# Patient Record
Sex: Female | Born: 1935
Health system: Southern US, Community
[De-identification: ages and names within clinical notes are randomized; demographics above are authoritative.]

## PROBLEM LIST (undated history)

## (undated) DIAGNOSIS — M109 Gout, unspecified: Secondary | ICD-10-CM

## (undated) DIAGNOSIS — K219 Gastro-esophageal reflux disease without esophagitis: Secondary | ICD-10-CM

## (undated) DIAGNOSIS — N184 Chronic kidney disease, stage 4 (severe): Secondary | ICD-10-CM

## (undated) DIAGNOSIS — E669 Obesity, unspecified: Secondary | ICD-10-CM

## (undated) DIAGNOSIS — B019 Varicella without complication: Secondary | ICD-10-CM

## (undated) DIAGNOSIS — Z780 Asymptomatic menopausal state: Secondary | ICD-10-CM

## (undated) DIAGNOSIS — M1990 Unspecified osteoarthritis, unspecified site: Secondary | ICD-10-CM

## (undated) DIAGNOSIS — C50919 Malignant neoplasm of unspecified site of unspecified female breast: Secondary | ICD-10-CM

## (undated) DIAGNOSIS — I872 Venous insufficiency (chronic) (peripheral): Secondary | ICD-10-CM

## (undated) DIAGNOSIS — K759 Inflammatory liver disease, unspecified: Secondary | ICD-10-CM

## (undated) DIAGNOSIS — I639 Cerebral infarction, unspecified: Secondary | ICD-10-CM

## (undated) DIAGNOSIS — I10 Essential (primary) hypertension: Secondary | ICD-10-CM

## (undated) HISTORY — PX: TONSILLECTOMY AND ADENOIDECTOMY: SHX28

## (undated) HISTORY — DX: Inflammatory liver disease, unspecified: K75.9

## (undated) HISTORY — PX: APPENDECTOMY: SHX54

## (undated) HISTORY — DX: Essential (primary) hypertension: I10

## (undated) HISTORY — PX: TOTAL KNEE ARTHROPLASTY: SHX125

## (undated) HISTORY — DX: Asymptomatic menopausal state: Z78.0

## (undated) HISTORY — PX: BREAST SURGERY: SHX581

## (undated) HISTORY — DX: Varicella without complication: B01.9

## (undated) HISTORY — DX: Unspecified osteoarthritis, unspecified site: M19.90

## (undated) HISTORY — PX: CHOLECYSTECTOMY: SHX55

## (undated) HISTORY — DX: Gastro-esophageal reflux disease without esophagitis: K21.9

---

## 1997-08-07 ENCOUNTER — Other Ambulatory Visit: Admission: RE | Admit: 1997-08-07 | Discharge: 1997-08-07 | Payer: Self-pay | Admitting: General Surgery

## 1997-08-17 ENCOUNTER — Inpatient Hospital Stay (HOSPITAL_COMMUNITY): Admission: RE | Admit: 1997-08-17 | Discharge: 1997-08-18 | Payer: Self-pay | Admitting: General Surgery

## 1997-09-18 ENCOUNTER — Ambulatory Visit (HOSPITAL_COMMUNITY): Admission: RE | Admit: 1997-09-18 | Discharge: 1997-09-18 | Payer: Self-pay | Admitting: Oncology

## 1997-12-01 ENCOUNTER — Encounter: Admission: RE | Admit: 1997-12-01 | Discharge: 1998-02-01 | Payer: Self-pay | Admitting: Oncology

## 1998-05-22 ENCOUNTER — Other Ambulatory Visit: Admission: RE | Admit: 1998-05-22 | Discharge: 1998-05-22 | Payer: Self-pay | Admitting: Family Medicine

## 1999-06-04 ENCOUNTER — Other Ambulatory Visit: Admission: RE | Admit: 1999-06-04 | Discharge: 1999-06-04 | Payer: Self-pay | Admitting: Family Medicine

## 1999-12-26 ENCOUNTER — Emergency Department (HOSPITAL_COMMUNITY): Admission: EM | Admit: 1999-12-26 | Discharge: 1999-12-26 | Payer: Self-pay | Admitting: Emergency Medicine

## 2000-01-14 ENCOUNTER — Ambulatory Visit (HOSPITAL_COMMUNITY): Admission: RE | Admit: 2000-01-14 | Discharge: 2000-01-14 | Payer: Self-pay | Admitting: Gastroenterology

## 2000-01-15 ENCOUNTER — Encounter: Payer: Self-pay | Admitting: Gastroenterology

## 2000-01-15 ENCOUNTER — Ambulatory Visit (HOSPITAL_COMMUNITY): Admission: RE | Admit: 2000-01-15 | Discharge: 2000-01-15 | Payer: Self-pay | Admitting: Gastroenterology

## 2000-03-10 ENCOUNTER — Ambulatory Visit (HOSPITAL_COMMUNITY): Admission: RE | Admit: 2000-03-10 | Discharge: 2000-03-10 | Payer: Self-pay | Admitting: Gastroenterology

## 2000-06-12 ENCOUNTER — Other Ambulatory Visit: Admission: RE | Admit: 2000-06-12 | Discharge: 2000-06-12 | Payer: Self-pay | Admitting: *Deleted

## 2003-08-22 ENCOUNTER — Other Ambulatory Visit: Admission: RE | Admit: 2003-08-22 | Discharge: 2003-08-22 | Payer: Self-pay | Admitting: Family Medicine

## 2003-08-23 LAB — CONVERTED CEMR LAB: Pap Smear: NORMAL

## 2003-11-22 ENCOUNTER — Encounter: Admission: RE | Admit: 2003-11-22 | Discharge: 2003-11-22 | Payer: Self-pay | Admitting: Family Medicine

## 2004-08-28 ENCOUNTER — Ambulatory Visit: Payer: Self-pay | Admitting: Family Medicine

## 2004-09-13 ENCOUNTER — Ambulatory Visit: Payer: Self-pay | Admitting: Oncology

## 2004-11-08 ENCOUNTER — Ambulatory Visit: Payer: Self-pay | Admitting: Oncology

## 2005-02-19 ENCOUNTER — Encounter: Admission: RE | Admit: 2005-02-19 | Discharge: 2005-02-19 | Payer: Self-pay | Admitting: Family Medicine

## 2005-02-19 ENCOUNTER — Ambulatory Visit: Payer: Self-pay | Admitting: Family Medicine

## 2005-05-01 ENCOUNTER — Ambulatory Visit: Payer: Self-pay | Admitting: Family Medicine

## 2005-05-12 ENCOUNTER — Ambulatory Visit: Payer: Self-pay | Admitting: Oncology

## 2005-05-28 ENCOUNTER — Encounter: Payer: Self-pay | Admitting: Oncology

## 2005-06-11 ENCOUNTER — Ambulatory Visit: Payer: Self-pay | Admitting: Family Medicine

## 2005-09-02 ENCOUNTER — Encounter: Payer: Self-pay | Admitting: Family Medicine

## 2005-09-02 ENCOUNTER — Ambulatory Visit: Payer: Self-pay | Admitting: Family Medicine

## 2005-09-02 ENCOUNTER — Other Ambulatory Visit: Admission: RE | Admit: 2005-09-02 | Discharge: 2005-09-02 | Payer: Self-pay | Admitting: Family Medicine

## 2005-09-02 LAB — CONVERTED CEMR LAB: Pap Smear: NORMAL

## 2005-11-06 ENCOUNTER — Ambulatory Visit: Payer: Self-pay | Admitting: Oncology

## 2006-05-14 ENCOUNTER — Ambulatory Visit: Payer: Self-pay | Admitting: Family Medicine

## 2006-08-06 ENCOUNTER — Ambulatory Visit: Payer: Self-pay | Admitting: Oncology

## 2006-09-07 DIAGNOSIS — Z853 Personal history of malignant neoplasm of breast: Secondary | ICD-10-CM

## 2006-09-07 DIAGNOSIS — I10 Essential (primary) hypertension: Secondary | ICD-10-CM | POA: Insufficient documentation

## 2006-09-07 DIAGNOSIS — K219 Gastro-esophageal reflux disease without esophagitis: Secondary | ICD-10-CM

## 2006-09-09 ENCOUNTER — Ambulatory Visit: Payer: Self-pay | Admitting: Family Medicine

## 2006-09-09 DIAGNOSIS — M810 Age-related osteoporosis without current pathological fracture: Secondary | ICD-10-CM | POA: Insufficient documentation

## 2006-09-09 LAB — CONVERTED CEMR LAB
ALT: 19 units/L (ref 0–35)
AST: 24 units/L (ref 0–37)
Albumin: 4 g/dL (ref 3.5–5.2)
Alkaline Phosphatase: 95 units/L (ref 39–117)
Basophils Relative: 2.3 % — ABNORMAL HIGH (ref 0.0–1.0)
Bilirubin Urine: NEGATIVE
Calcium: 11 mg/dL — ABNORMAL HIGH (ref 8.4–10.5)
Cholesterol: 189 mg/dL (ref 0–200)
Eosinophils Absolute: 0.2 10*3/uL (ref 0.0–0.6)
Eosinophils Relative: 2.9 % (ref 0.0–5.0)
GFR calc Af Amer: 51 mL/min
GFR calc non Af Amer: 42 mL/min
Glucose, Urine, Semiquant: NEGATIVE
HCT: 37.9 % (ref 36.0–46.0)
Ketones, urine, test strip: NEGATIVE
LDL Cholesterol: 118 mg/dL — ABNORMAL HIGH (ref 0–99)
Lymphocytes Relative: 21.2 % (ref 12.0–46.0)
Monocytes Absolute: 0.4 10*3/uL (ref 0.2–0.7)
Neutro Abs: 3.8 10*3/uL (ref 1.4–7.7)
Platelets: 360 10*3/uL (ref 150–400)
Potassium: 3.6 meq/L (ref 3.5–5.1)
Sodium: 148 meq/L — ABNORMAL HIGH (ref 135–145)
Total Bilirubin: 0.8 mg/dL (ref 0.3–1.2)
Total CHOL/HDL Ratio: 3.9
Total Protein: 7.3 g/dL (ref 6.0–8.3)
Urobilinogen, UA: NEGATIVE
WBC Urine, dipstick: NEGATIVE
WBC: 5.7 10*3/uL (ref 4.5–10.5)
pH: 5.5

## 2006-09-15 DIAGNOSIS — Z8739 Personal history of other diseases of the musculoskeletal system and connective tissue: Secondary | ICD-10-CM

## 2006-11-20 ENCOUNTER — Ambulatory Visit: Payer: Self-pay | Admitting: Family Medicine

## 2006-11-25 LAB — CONVERTED CEMR LAB: Vit D, 1,25-Dihydroxy: 34 (ref 30–89)

## 2006-11-26 ENCOUNTER — Telehealth: Payer: Self-pay | Admitting: Family Medicine

## 2007-01-25 ENCOUNTER — Ambulatory Visit: Payer: Self-pay | Admitting: Family Medicine

## 2007-02-03 ENCOUNTER — Telehealth: Payer: Self-pay | Admitting: Family Medicine

## 2007-02-26 ENCOUNTER — Encounter: Payer: Self-pay | Admitting: Family Medicine

## 2007-03-02 ENCOUNTER — Ambulatory Visit: Payer: Self-pay | Admitting: Oncology

## 2007-03-04 ENCOUNTER — Encounter: Payer: Self-pay | Admitting: Family Medicine

## 2007-09-16 ENCOUNTER — Ambulatory Visit: Payer: Self-pay | Admitting: Family Medicine

## 2007-09-16 DIAGNOSIS — E039 Hypothyroidism, unspecified: Secondary | ICD-10-CM | POA: Insufficient documentation

## 2007-09-16 DIAGNOSIS — E669 Obesity, unspecified: Secondary | ICD-10-CM

## 2007-09-16 LAB — CONVERTED CEMR LAB
Blood in Urine, dipstick: NEGATIVE
Nitrite: NEGATIVE
Protein, U semiquant: NEGATIVE
Urobilinogen, UA: 0.2

## 2007-09-24 LAB — CONVERTED CEMR LAB
ALT: 19 units/L (ref 0–35)
AST: 25 units/L (ref 0–37)
BUN: 52 mg/dL — ABNORMAL HIGH (ref 6–23)
Basophils Absolute: 0 10*3/uL (ref 0.0–0.1)
CO2: 26 meq/L (ref 19–32)
Eosinophils Absolute: 0.2 10*3/uL (ref 0.0–0.7)
HCT: 35.5 % — ABNORMAL LOW (ref 36.0–46.0)
Hemoglobin: 12.2 g/dL (ref 12.0–15.0)
LDL Cholesterol: 122 mg/dL — ABNORMAL HIGH (ref 0–99)
MCHC: 34.3 g/dL (ref 30.0–36.0)
Neutro Abs: 3.9 10*3/uL (ref 1.4–7.7)
Platelets: 349 10*3/uL (ref 150–400)
Potassium: 3 meq/L — ABNORMAL LOW (ref 3.5–5.1)
RBC: 3.98 M/uL (ref 3.87–5.11)
RDW: 12.8 % (ref 11.5–14.6)
Sodium: 141 meq/L (ref 135–145)
TSH: 3.18 microintl units/mL (ref 0.35–5.50)
Total Bilirubin: 0.7 mg/dL (ref 0.3–1.2)
Total Protein: 7.9 g/dL (ref 6.0–8.3)
WBC: 5.4 10*3/uL (ref 4.5–10.5)

## 2007-09-28 ENCOUNTER — Telehealth: Payer: Self-pay | Admitting: Family Medicine

## 2007-10-28 ENCOUNTER — Ambulatory Visit: Payer: Self-pay | Admitting: Family Medicine

## 2007-10-29 ENCOUNTER — Encounter: Payer: Self-pay | Admitting: Family Medicine

## 2007-11-15 LAB — CONVERTED CEMR LAB
BUN: 49 mg/dL — ABNORMAL HIGH (ref 6–23)
CO2: 26 meq/L (ref 19–32)
Creatinine, Ser: 2 mg/dL — ABNORMAL HIGH (ref 0.4–1.2)
GFR calc Af Amer: 31 mL/min
GFR calc non Af Amer: 26 mL/min
Sodium: 140 meq/L (ref 135–145)

## 2007-11-16 ENCOUNTER — Telehealth: Payer: Self-pay | Admitting: Family Medicine

## 2007-11-30 ENCOUNTER — Ambulatory Visit: Payer: Self-pay | Admitting: Family Medicine

## 2007-12-09 ENCOUNTER — Telehealth (INDEPENDENT_AMBULATORY_CARE_PROVIDER_SITE_OTHER): Payer: Self-pay | Admitting: *Deleted

## 2007-12-13 ENCOUNTER — Ambulatory Visit: Payer: Self-pay | Admitting: Family Medicine

## 2007-12-16 LAB — CONVERTED CEMR LAB
Chloride: 93 meq/L — ABNORMAL LOW (ref 96–112)
Creatinine, Ser: 1.8 mg/dL — ABNORMAL HIGH (ref 0.4–1.2)
GFR calc non Af Amer: 29 mL/min
Potassium: 4.4 meq/L (ref 3.5–5.1)
Sodium: 127 meq/L — ABNORMAL LOW (ref 135–145)

## 2008-02-10 ENCOUNTER — Ambulatory Visit: Payer: Self-pay | Admitting: Family Medicine

## 2008-02-10 LAB — CONVERTED CEMR LAB
Bilirubin Urine: NEGATIVE
Blood in Urine, dipstick: NEGATIVE
Glucose, Urine, Semiquant: NEGATIVE
Ketones, urine, test strip: NEGATIVE
Protein, U semiquant: NEGATIVE
Specific Gravity, Urine: <1.005
WBC Urine, dipstick: NEGATIVE
pH: 6

## 2008-02-15 ENCOUNTER — Ambulatory Visit: Payer: Self-pay | Admitting: Family Medicine

## 2008-02-15 DIAGNOSIS — N19 Unspecified kidney failure: Secondary | ICD-10-CM | POA: Insufficient documentation

## 2008-02-15 LAB — CONVERTED CEMR LAB
ALT: 17 units/L (ref 0–35)
AST: 24 units/L (ref 0–37)
Alkaline Phosphatase: 61 units/L (ref 39–117)
Basophils Absolute: 0 10*3/uL (ref 0.0–0.1)
Basophils Relative: 0.6 % (ref 0.0–3.0)
CO2: 30 meq/L (ref 19–32)
Calcium: 11.1 mg/dL — ABNORMAL HIGH (ref 8.4–10.5)
Chloride: 102 meq/L (ref 96–112)
Cholesterol: 182 mg/dL (ref 0–200)
Creatinine, Ser: 1.6 mg/dL — ABNORMAL HIGH (ref 0.4–1.2)
Eosinophils Absolute: 0.2 10*3/uL (ref 0.0–0.7)
Eosinophils Relative: 2.9 % (ref 0.0–5.0)
Glucose, Bld: 108 mg/dL — ABNORMAL HIGH (ref 70–99)
HCT: 35.3 % — ABNORMAL LOW (ref 36.0–46.0)
HDL: 55 mg/dL (ref >39.0)
Hemoglobin: 12.2 g/dL (ref 12.0–15.0)
LDL Cholesterol: 108 mg/dL — ABNORMAL HIGH (ref 0–99)
Monocytes Absolute: 0.4 10*3/uL (ref 0.1–1.0)
Monocytes Relative: 8.4 % (ref 3.0–12.0)
Neutro Abs: 3.6 10*3/uL (ref 1.4–7.7)
RBC: 3.85 M/uL — ABNORMAL LOW (ref 3.87–5.11)
RDW: 13.2 % (ref 11.5–14.6)
Sodium: 139 meq/L (ref 135–145)
TSH: 2.44 microintl units/mL (ref 0.35–5.50)
Total CHOL/HDL Ratio: 3.3
Total Protein: 7.2 g/dL (ref 6.0–8.3)
VLDL: 19 mg/dL (ref 0–40)
WBC: 5.3 10*3/uL (ref 4.5–10.5)

## 2008-03-01 ENCOUNTER — Encounter: Payer: Self-pay | Admitting: Family Medicine

## 2008-03-08 ENCOUNTER — Ambulatory Visit: Payer: Self-pay | Admitting: Oncology

## 2008-03-10 ENCOUNTER — Encounter: Payer: Self-pay | Admitting: Family Medicine

## 2008-05-18 ENCOUNTER — Telehealth: Payer: Self-pay | Admitting: Family Medicine

## 2008-05-29 ENCOUNTER — Ambulatory Visit: Payer: Self-pay | Admitting: Family Medicine

## 2008-06-01 LAB — CONVERTED CEMR LAB
Calcium: 10.1 mg/dL (ref 8.4–10.5)
Creatinine, Ser: 1.7 mg/dL — ABNORMAL HIGH (ref 0.4–1.2)
Phosphorus: 3.6 mg/dL (ref 2.3–4.6)
Potassium: 4 meq/L (ref 3.5–5.1)
Sodium: 135 meq/L (ref 135–145)

## 2008-06-06 ENCOUNTER — Ambulatory Visit: Payer: Self-pay | Admitting: Family Medicine

## 2008-07-20 ENCOUNTER — Ambulatory Visit: Payer: Self-pay | Admitting: Family Medicine

## 2008-07-20 DIAGNOSIS — I959 Hypotension, unspecified: Secondary | ICD-10-CM

## 2008-07-20 DIAGNOSIS — J309 Allergic rhinitis, unspecified: Secondary | ICD-10-CM | POA: Insufficient documentation

## 2008-09-21 ENCOUNTER — Ambulatory Visit: Payer: Self-pay | Admitting: Family Medicine

## 2008-09-21 LAB — CONVERTED CEMR LAB
Bilirubin Urine: NEGATIVE
Glucose, Urine, Semiquant: NEGATIVE
Ketones, urine, test strip: NEGATIVE
Protein, U semiquant: NEGATIVE
Specific Gravity, Urine: <1.005
Urobilinogen, UA: 0.2

## 2008-10-04 LAB — CONVERTED CEMR LAB
AST: 21 units/L (ref 0–37)
Alkaline Phosphatase: 59 units/L (ref 39–117)
Basophils Absolute: 0 10*3/uL (ref 0.0–0.1)
Basophils Relative: 1 % (ref 0.0–3.0)
Chloride: 113 meq/L — ABNORMAL HIGH (ref 96–112)
Eosinophils Absolute: 0.2 10*3/uL (ref 0.0–0.7)
Eosinophils Relative: 3.3 % (ref 0.0–5.0)
GFR calc non Af Amer: 35.58 mL/min (ref >60)
Glucose, Bld: 105 mg/dL — ABNORMAL HIGH (ref 70–99)
HCT: 36.2 % (ref 36.0–46.0)
Hemoglobin: 12.3 g/dL (ref 12.0–15.0)
MCHC: 34.1 g/dL (ref 30.0–36.0)
MCV: 91.7 fL (ref 78.0–100.0)
Monocytes Absolute: 0.4 10*3/uL (ref 0.1–1.0)
Neutro Abs: 3.1 10*3/uL (ref 1.4–7.7)
Sodium: 143 meq/L (ref 135–145)
TSH: 3.18 microintl units/mL (ref 0.35–5.50)
Total Bilirubin: 0.7 mg/dL (ref 0.3–1.2)
Total CHOL/HDL Ratio: 3
Total Protein: 7.3 g/dL (ref 6.0–8.3)
Vit D, 25-Hydroxy: 21 ng/mL — ABNORMAL LOW (ref 30–89)
WBC: 4.7 10*3/uL (ref 4.5–10.5)

## 2008-12-04 ENCOUNTER — Encounter: Payer: Self-pay | Admitting: Family Medicine

## 2009-01-25 ENCOUNTER — Telehealth: Payer: Self-pay | Admitting: Family Medicine

## 2009-01-25 DIAGNOSIS — E559 Vitamin D deficiency, unspecified: Secondary | ICD-10-CM | POA: Insufficient documentation

## 2009-02-01 ENCOUNTER — Ambulatory Visit: Payer: Self-pay | Admitting: Family Medicine

## 2009-02-20 ENCOUNTER — Telehealth: Payer: Self-pay | Admitting: Family Medicine

## 2009-03-06 ENCOUNTER — Ambulatory Visit: Payer: Self-pay | Admitting: Oncology

## 2009-03-06 ENCOUNTER — Encounter: Payer: Self-pay | Admitting: Family Medicine

## 2009-03-09 ENCOUNTER — Encounter: Payer: Self-pay | Admitting: Family Medicine

## 2009-03-13 ENCOUNTER — Ambulatory Visit: Payer: Self-pay | Admitting: Family Medicine

## 2009-03-13 DIAGNOSIS — M1710 Unilateral primary osteoarthritis, unspecified knee: Secondary | ICD-10-CM

## 2009-03-14 ENCOUNTER — Ambulatory Visit: Payer: Self-pay | Admitting: Family Medicine

## 2009-03-21 ENCOUNTER — Telehealth: Payer: Self-pay | Admitting: Family Medicine

## 2009-04-06 ENCOUNTER — Encounter: Payer: Self-pay | Admitting: Family Medicine

## 2009-05-08 ENCOUNTER — Inpatient Hospital Stay (HOSPITAL_COMMUNITY): Admission: RE | Admit: 2009-05-08 | Discharge: 2009-05-10 | Payer: Self-pay | Admitting: Orthopedic Surgery

## 2009-05-24 ENCOUNTER — Encounter: Payer: Self-pay | Admitting: Family Medicine

## 2009-08-14 ENCOUNTER — Telehealth: Payer: Self-pay | Admitting: Family Medicine

## 2010-02-14 ENCOUNTER — Encounter: Payer: Self-pay | Admitting: Family Medicine

## 2010-02-14 ENCOUNTER — Other Ambulatory Visit: Payer: Self-pay | Admitting: Family Medicine

## 2010-02-14 ENCOUNTER — Ambulatory Visit
Admission: RE | Admit: 2010-02-14 | Discharge: 2010-02-14 | Payer: Self-pay | Source: Home / Self Care | Attending: Family Medicine | Admitting: Family Medicine

## 2010-02-14 DIAGNOSIS — Z96659 Presence of unspecified artificial knee joint: Secondary | ICD-10-CM | POA: Insufficient documentation

## 2010-02-14 LAB — HEPATIC FUNCTION PANEL
ALT: 16 U/L (ref 0–35)
AST: 21 U/L (ref 0–37)
Albumin: 4.1 g/dL (ref 3.5–5.2)
Alkaline Phosphatase: 70 U/L (ref 39–117)
Bilirubin, Direct: 0.1 mg/dL (ref 0.0–0.3)
Total Bilirubin: 0.5 mg/dL (ref 0.3–1.2)
Total Protein: 7.1 g/dL (ref 6.0–8.3)

## 2010-02-14 LAB — CBC WITH DIFFERENTIAL/PLATELET
Basophils Absolute: 0 10*3/uL (ref 0.0–0.1)
Basophils Relative: 0.6 % (ref 0.0–3.0)
Eosinophils Absolute: 0.2 10*3/uL (ref 0.0–0.7)
Eosinophils Relative: 3.4 % (ref 0.0–5.0)
HCT: 35.9 % — ABNORMAL LOW (ref 36.0–46.0)
Hemoglobin: 12.1 g/dL (ref 12.0–15.0)
Lymphocytes Relative: 20.2 % (ref 12.0–46.0)
Lymphs Abs: 1.1 10*3/uL (ref 0.7–4.0)
MCHC: 33.8 g/dL (ref 30.0–36.0)
MCV: 89.5 fl (ref 78.0–100.0)
Monocytes Absolute: 0.4 10*3/uL (ref 0.1–1.0)
Monocytes Relative: 7.4 % (ref 3.0–12.0)
Neutro Abs: 3.7 10*3/uL (ref 1.4–7.7)
Neutrophils Relative %: 68.4 % (ref 43.0–77.0)
Platelets: 306 10*3/uL (ref 150.0–400.0)
RBC: 4.01 Mil/uL (ref 3.87–5.11)
RDW: 13.9 % (ref 11.5–14.6)
WBC: 5.4 10*3/uL (ref 4.5–10.5)

## 2010-02-14 LAB — LIPID PANEL
Cholesterol: 181 mg/dL (ref 0–200)
HDL: 61.6 mg/dL (ref >39.00)
LDL Cholesterol: 101 mg/dL — ABNORMAL HIGH (ref 0–99)
Total CHOL/HDL Ratio: 3
Triglycerides: 91 mg/dL (ref 0.0–149.0)
VLDL: 18.2 mg/dL (ref 0.0–40.0)

## 2010-02-14 LAB — CONVERTED CEMR LAB
Bilirubin Urine: NEGATIVE
Blood in Urine, dipstick: NEGATIVE
Specific Gravity, Urine: <1.005
WBC Urine, dipstick: NEGATIVE
pH: 5

## 2010-02-14 LAB — BASIC METABOLIC PANEL
BUN: 31 mg/dL — ABNORMAL HIGH (ref 6–23)
CO2: 25 mEq/L (ref 19–32)
Calcium: 10.4 mg/dL (ref 8.4–10.5)
Chloride: 107 mEq/L (ref 96–112)
Creatinine, Ser: 1.4 mg/dL — ABNORMAL HIGH (ref 0.4–1.2)
GFR: 37.77 mL/min — ABNORMAL LOW (ref >60.00)
Glucose, Bld: 92 mg/dL (ref 70–99)
Potassium: 4.8 mEq/L (ref 3.5–5.1)
Sodium: 138 mEq/L (ref 135–145)

## 2010-02-14 LAB — TSH: TSH: 3.3 u[IU]/mL (ref 0.35–5.50)

## 2010-02-28 NOTE — Assessment & Plan Note (Signed)
Summary: emp/pt fasting/cjr/pt rescd from bump//ccm/pt rsc/cjr   Vital Signs:  Patient profile:   77 year old female Menstrual status:  postmenopausal Height:      58 inches Weight:      160 pounds BMI:     33.56 O2 Sat:      96 % on Room air Temp:     97.6 degrees F oral Pulse rate:   87 / minute Pulse rhythm:   regular BP sitting:   110 / 70  (left arm) Cuff size:   regular  Vitals Entered By: Sherron Monday, CMA (AAMA) (February 14, 2010 1:26 PM)  O2 Flow:  Room air CC: Annual Visit for Disease Management   History of Present Illness: This 77 year old white widowed is in to discuss her medical problems as well as refill her medications as well as get numbness her laboratory studies He had right knee replaced April 12 and Dr. Catalina Antigua 01. She had been doing well with good movement and reflexes Continues to see Dr. Fabiola Backer for breast cancer and doing 5 had mammogram in February Blood pressure is doing fine Her is under good control Other joints are doing fine Blood pressure is stated is under good control  Preventive Screening-Counseling & Management  Alcohol-Tobacco     Smoking Status: never  Caffeine-Diet-Exercise     Does Patient Exercise: yes  Current Medications (verified): 1)  Toprol Xl 25 Mg  Xr24h-Tab (Metoprolol Succinate) .Marland Kitchen.. 1 By Mouth Once Daily 2)  Ecotrin 325 Mg  Tbec (Aspirin) .... Take 1 Tablet By Mouth Once A Day 3)  Caltrate 600+d 600-400 Mg-Unit  Tabs (Calcium Carbonate-Vitamin D) 4)  Multi-Vitamin   Tabs (Multiple Vitamin) 5)  Diovan 80 Mg Tabs (Valsartan) .Marland Kitchen.. 1 Qd 6)  Prilosec 20 Mg Cpdr (Omeprazole) .... Otc  Allergies (verified): 1)  ! Relafen 2)  ! Prednisone 3)  ! Levaquin  Past History:  Past Medical History: Last updated: 03/13/2009 Breast cancer, hx of, rt breast GERD Hypertension Arthritis Menopause Chicken Pox Hepatitis/Jaundice  Family History: Last updated: 09/07/2006 Family History Breast cancer Family History of  Cardiovascular disorder  Social History: Last updated: 09/07/2006 Retired Single Never Smoked Alcohol use-no Drug use-no Regular exercise-yes  Past Surgical History: Appendectomy-1950 Cholecystectomy-1999 T&A-1947 Breast CA-1999 mastectomy rt knee replacement tonsillectomy  Review of Systems      See HPI  Physical Exam  General:  Well-developed,well-nourished,in no acute distress; alert,appropriate and cooperative throughout examination Head:  Normocephalic and atraumatic without obvious abnormalities. No apparent alopecia or balding. Eyes:  No corneal or conjunctival inflammation noted. EOMI. Perrla. Funduscopic exam benign, without hemorrhages, exudates or papilledema. Vision grossly normal. Ears:  External ear exam shows no significant lesions or deformities.  Otoscopic examination reveals clear canals, tympanic membranes are intact bilaterally without bulging, retraction, inflammation or discharge. Hearing is grossly normal bilaterally. Nose:  External nasal examination shows no deformity or inflammation. Nasal mucosa are pink and moist without lesions or exudates. Mouth:  Oral mucosa and oropharynx without lesions or exudates.  Teeth in good repair. Neck:  No deformities, masses, or tenderness noted. Chest Wall:  No deformities, masses, or tenderness noted. Breasts:  mastectomy right breast left breast negative Lungs:  Normal respiratory effort, chest expands symmetrically. Lungs are clear to auscultation, no crackles or wheezes. Heart:  Normal rate and regular rhythm. S1 and S2 normal without gallop, murmur, click, rub or other extra sounds. Abdomen:  Bowel sounds positive,abdomen soft and non-tender without masses, organomegaly or hernias noted. Rectal:  on  exam Pulses:  R and L carotid,radial,femoral,dorsalis pedis and posterior tibial pulses are full and equal bilaterally Extremities:  No clubbing, cyanosis, edema, or deformity noted with normal full range of motion of  all joints.   Neurologic:  No cranial nerve deficits noted. Station and gait are normal. Plantar reflexes are down-going bilaterally. DTRs are symmetrical throughout. Sensory, motor and coordinative functions appear intact. Skin:  Intact without suspicious lesions or rashes Cervical Nodes:  No lymphadenopathy noted Axillary Nodes:  No palpable lymphadenopathy Inguinal Nodes:  No significant adenopathy Psych:  Cognition and judgment appear intact. Alert and cooperative with normal attention span and concentration. No apparent delusions, illusions, hallucinations   Complete Medication List: 1)  Toprol Xl 25 Mg Xr24h-tab (Metoprolol succinate) .Marland Kitchen.. 1 by mouth once daily 2)  Ecotrin 325 Mg Tbec (Aspirin) .... Take 1 tablet by mouth once a day 3)  Caltrate 600+d 600-400 Mg-unit Tabs (Calcium carbonate-vitamin d) 4)  Multi-vitamin Tabs (Multiple vitamin) 5)  Diovan 80 Mg Tabs (Valsartan) .Marland Kitchen.. 1 qd 6)  Omeprazole 40 Mg Cpdr (Omeprazole) .Marland Kitchen.. 1 once daily for gerd 7)  Maxzide-25 37.5-25 Mg Tabs (Triamterene-hctz) .Marland Kitchen.. 1 tab once or twice per day for fluid retention  Other Orders: Venipuncture [id number]) UA Dipstick w/o Micro (automated)  (81003) T-Vitamin D (25-Hydroxy) [id number]) Specimen Handling (99000) TLB-Lipid Panel (80061-LIPID) TLB-BMP (Basic Metabolic Panel-BMET) (99991111) TLB-CBC Platelet - w/Differential (85025-CBCD) TLB-Hepatic/Liver Function Pnl (80076-HEPATIC) TLB-TSH (Thyroid Stimulating Hormone) [id number]) Prescription Created Electronically [id number])  Patient Instructions: 1)  Continued exercise and watch her weight as we discussed 2)  Continue your regular medications Prescriptions: MAXZIDE-25 37.5-25 MG TABS (TRIAMTERENE-HCTZ) 1 tab once or twice per day for fluid retention  #60 x 11   Entered and Authorized by:   Emeterio Reeve MD   Signed by:   Emeterio Reeve MD on 02/14/2010   Method used:   Electronically to        [facility] [phone]  (retail)       [place], [place]  [address]       Ph: [phone] or [phone]       Fax: [phone]   RxID:   [id number] OMEPRAZOLE 40 MG CPDR (OMEPRAZOLE) 1 once daily for gerd  #90 x 3   Entered and Authorized by:   Emeterio Reeve MD   Signed by:   Emeterio Reeve MD on 02/14/2010   Method used:   Print then Give to Patient   RxID:   [id number] DIOVAN 80 MG TABS (VALSARTAN) 1 qd  #90 x 3   Entered and Authorized by:   Emeterio Reeve MD   Signed by:   Emeterio Reeve MD on 02/14/2010   Method used:   Print then Give to Patient   RxID:   [id number] TOPROL XL 25 MG  XR24H-TAB (METOPROLOL SUCCINATE) 1 by mouth once daily  #90 x 3   Entered and Authorized by:   Emeterio Reeve MD   Signed by:   Emeterio Reeve MD on 02/14/2010   Method used:   Print then Give to Patient   RxID:   [id number]    Orders Added: 1)  Venipuncture [id number] 2)  UA Dipstick w/o Micro (automated)  [81003] 3)  T-Vitamin D (25-Hydroxy) [id number] 4)  Specimen Handling [id number] 5)  TLB-Lipid Panel [80061-LIPID] 6)  TLB-BMP (Basic Metabolic Panel-BMET) 123456 7)  TLB-CBC  Platelet - w/Differential [85025-CBCD] 8)  TLB-Hepatic/Liver Function Pnl [80076-HEPATIC] 9)  TLB-TSH (Thyroid Stimulating Hormone) [84443-TSH] 10)  Prescription Created Electronically [G8553] 11)  Est. Patient Level IV [id number]    Laboratory Results   Urine Tests    Routine Urinalysis   Color: yellow Appearance: Clear Glucose: negative   (Normal Range: Negative) Bilirubin: negative   (Normal Range: Negative) Ketone: negative   (Normal Range: Negative) Spec. Gravity: <1.005   (Normal Range: 1.003-1.035) Blood: negative   (Normal Range: Negative) pH: 5.0   (Normal Range: 5.0-8.0) Protein: negative   (Normal Range: Negative) Urobilinogen: 0.2   (Normal Range: 0-1) Nitrite: negative   (Normal Range: Negative) Leukocyte Esterace: negative   (Normal  Range: Negative)    Comments: Joyce Gross  February 14, 2010 4:11 PM     Appended Document: emp/pt fasting/cjr/pt rescd from bump//ccm/pt rsc/cjr     Allergies: 1)  ! Relafen 2)  ! Prednisone 3)  ! Levaquin   Impression & Recommendations:  Problem # 1:  KNEE JOINT REPLACEMENT BY OTHER MEANS (ICD-V43.65) Assessment Improved  Problem # 2:  OSTEOPENIA (ICD-733.90) Assessment: Improved  Her updated medication list for this problem includes:    Caltrate 600+d 600-400 Mg-unit Tabs (Calcium carbonate-vitamin d)  Problem # 3:  OSTEOARTHRITIS, KNEE, RIGHT (ICD-715.96) Assessment: Improved  Her updated medication list for this problem includes:    Ecotrin 325 Mg Tbec (Aspirin) .Marland Kitchen... Take 1 tablet by mouth once a day  Problem # 5:  RENAL FAILURE (ICD-586) Assessment: Improved  Problem # 6:  EXOGENOUS OBESITY (ICD-278.00) Assessment: Improved  Problem # 7:  HYPERTENSION (ICD-401.9) Assessment: Improved  Her updated medication list for this problem includes:    Toprol Xl 25 Mg Xr24h-tab (Metoprolol succinate) .Marland Kitchen... 1 by mouth once daily    Diovan 80 Mg Tabs (Valsartan) .Marland Kitchen... 1 qd    Maxzide-25 37.5-25 Mg Tabs (Triamterene-hctz) .Marland Kitchen... 1 tab once or twice per day for fluid retention  Problem # 8:  BREAST CANCER, HX OF (ICD-V10.3) Assessment: Improved  Problem # 9:  GERD (ICD-530.81) Assessment: Improved  Her updated medication list for this problem includes:    Omeprazole 40 Mg Cpdr (Omeprazole) .Marland Kitchen... 1 once daily for gerd  Complete Medication List: 1)  Toprol Xl 25 Mg Xr24h-tab (Metoprolol succinate) .Marland Kitchen.. 1 by mouth once daily 2)  Ecotrin 325 Mg Tbec (Aspirin) .... Take 1 tablet by mouth once a day 3)  Caltrate 600+d 600-400 Mg-unit Tabs (Calcium carbonate-vitamin d) 4)  Multi-vitamin Tabs (Multiple vitamin) 5)  Diovan 80 Mg Tabs (Valsartan) .Marland Kitchen.. 1 qd 6)  Omeprazole 40 Mg Cpdr (Omeprazole) .Marland Kitchen.. 1 once daily for gerd 7)  Maxzide-25 37.5-25 Mg Tabs  (Triamterene-hctz) .Marland Kitchen.. 1 tab once or twice per day for fluid retention

## 2010-02-28 NOTE — Letter (Signed)
Summary: Request for Surgical Clearance/[facility]  Request for Surgical Clearance/[facility]   Imported By: Laural Benes 04/09/2009 15:49:38  _____________________________________________________________________  External Attachment:    Type:   Image     Comment:   External Document

## 2010-02-28 NOTE — Miscellaneous (Signed)
Summary: Drug Regimen Review/[facility]  Drug Regimen Review/[facility]   Imported By: Laural Benes 05/29/2009 13:21:30  _____________________________________________________________________  External Attachment:    Type:   Image     Comment:   External Document

## 2010-02-28 NOTE — Procedures (Signed)
Summary: Colonoscopy with polypectomy/Dr. Earlean Shawl  Colonoscopy with polypectomy/Dr. Earlean Shawl   Imported By: Laural Benes 01/31/2009 15:18:53  _____________________________________________________________________  External Attachment:    Type:   Image     Comment:   External Document

## 2010-02-28 NOTE — Letter (Signed)
Summary: [facility]   Imported By: Laural Benes 04/09/2009 11:16:15  _____________________________________________________________________  External Attachment:    Type:   Image     Comment:   External Document

## 2010-02-28 NOTE — Assessment & Plan Note (Signed)
Summary: knee pain/njr   Vital Signs:  Patient profile:   77 year old female Menstrual status:  postmenopausal Weight:      162 pounds O2 Sat:      97 % Temp:     97.8 degrees F Pulse rate:   66 / minute BP sitting:   110 / 62  (right arm)  Vitals Entered By: Levora Angel, RN (March 13, 2009 4:45 PM) CC: pt c/o right knee pain  Is Patient Diabetic? No   History of Present Illness: This 77 year old white female is complaining of pain in the right knee for cough when one week Had been diagnosed as arthritis but at this time pain is more direct him of the right patella with tenderness. Patient has not been taken an anti-inflammatory, or  Nsaid due to 2 previous renal failure.. She is also on the oncologist for breast cancer. Blood pressures been well controlled GERD is controlled with Prilosec 20 mg q.d. OTC, this has been controlled after she stopped taking Actonel which was aggravating the esophageal reflux  Allergies: 1)  ! Relafen 2)  ! Prednisone 3)  ! Levaquin  Past History:  Past Surgical History: Last updated: 09/07/2006 Appendectomy-1950 Cholecystectomy-1999 T&A-1947 Breast CA-1999  Social History: Last updated: 09/07/2006 Retired Single Never Smoked Alcohol use-no Drug use-no Regular exercise-yes  Risk Factors: Smoking Status: never (09/07/2006)  Past Medical History: Breast cancer, hx of, rt breast GERD Hypertension Arthritis Menopause Chicken Pox Hepatitis/Jaundice  Review of Systems  The patient denies anorexia, fever, weight loss, weight gain, vision loss, decreased hearing, hoarseness, chest pain, syncope, dyspnea on exertion, peripheral edema, prolonged cough, headaches, hemoptysis, abdominal pain, melena, hematochezia, severe indigestion/heartburn, hematuria, incontinence, genital sores, muscle weakness, suspicious skin lesions, transient blindness, difficulty walking, depression, unusual weight change, abnormal bleeding, enlarged lymph  nodes, angioedema, breast masses, and testicular masses.    Physical Exam  General:  Well-developed,well-nourished,in no acute distress; alert,appropriate and cooperative throughout examinationoverweight-appearing.   Head:  Normocephalic and atraumatic without obvious abnormalities. No apparent alopecia or balding. Eyes:  No corneal or conjunctival inflammation noted. EOMI. Perrla. Funduscopic exam benign, without hemorrhages, exudates or papilledema. Vision grossly normal. Ears:  External ear exam shows no significant lesions or deformities.  Otoscopic examination reveals clear canals, tympanic membranes are intact bilaterally without bulging, retraction, inflammation or discharge. Hearing is grossly normal bilaterally. Nose:  External nasal examination shows no deformity or inflammation. Nasal mucosa are pink and moist without lesions or exudates. Mouth:  Oral mucosa and oropharynx without lesions or exudates.  Teeth in good repair. Chest Wall:  No deformities, masses, or tenderness noted. Breasts:  absent rt breastcomminuted cancer Lungs:  Normal respiratory effort, chest expands symmetrically. Lungs are clear to auscultation, no crackles or wheezes. Heart:  Normal rate and regular rhythm. S1 and S2 normal without gallop, murmur, click, rub or other extra sounds. Abdomen:  Bowel sounds positive,abdomen soft and non-tender without masses, organomegaly or hernias noted. Msk:  swollen rt knee, tender medial aqnd lateral meniscus grating noise under patella,And tenderness to palpation  Pulses:  R and L carotid,radial,femoral,dorsalis pedis and posterior tibial pulses are full and equal bilaterally Extremities:  No clubbing, cyanosis, edema, or deformity noted with normal full range of motion of all joints.   Neurologic:  No cranial nerve deficits noted. Station and gait are normal. Plantar reflexes are down-going bilaterally. DTRs are symmetrical throughout. Sensory, motor and coordinative  functions appear intact. Skin:  Intact without suspicious lesions or rashes   Impression &  Recommendations:  Problem # 1:  OSTEOARTHRITIS, KNEE, RIGHT (ICD-715.96) Assessment Deteriorated  Her updated medication list for this problem includes:    Ecotrin 325 Mg Tbec (Aspirin) .Marland Kitchen... Take 1 tablet by mouth once a day    Tylenol Arthritis Pain 650 Mg Tbcr (Acetaminophen)  Orders: Orthopedic Referral (Ortho)  Problem # 2:  CHONDROMALACIA PATELLA, RIGHT (ICD-717.7) Assessment: Deteriorated  Her updated medication list for this problem includes:    Ecotrin 325 Mg Tbec (Aspirin) .Marland Kitchen... Take 1 tablet by mouth once a day    Tylenol Arthritis Pain 650 Mg Tbcr (Acetaminophen)  Orders: T-Knee Comp Right 4 Views [id number]) Orthopedic Referral (Ortho)  Problem # 3:  HYPERTENSION (ICD-401.9) Assessment: Improved  Her updated medication list for this problem includes:    Toprol Xl 25 Mg Xr24h-tab (Metoprolol succinate) .Marland Kitchen... 1 by mouth once daily    Diovan 80 Mg Tabs (Valsartan) .Marland Kitchen... 1 qd  Problem # 4:  EXOGENOUS OBESITY (ICD-278.00) Assessment: Improved  Problem # 5:  GERD (ICD-530.81) Assessment: Improved  Her updated medication list for this problem includes:    Prilosec 20 Mg Cpdr (Omeprazole) ..... Otc  Problem # 6:  BREAST CANCER, HX OF (ICD-V10.3) Assessment: Unchanged  Complete Medication List: 1)  Toprol Xl 25 Mg Xr24h-tab (Metoprolol succinate) .Marland Kitchen.. 1 by mouth once daily 2)  Ecotrin 325 Mg Tbec (Aspirin) .... Take 1 tablet by mouth once a day 3)  Caltrate 600+d 600-400 Mg-unit Tabs (Calcium carbonate-vitamin d) 4)  Multi-vitamin Tabs (Multiple vitamin) 5)  Tylenol Arthritis Pain 650 Mg Tbcr (Acetaminophen) 6)  Diovan 80 Mg Tabs (Valsartan) .Marland Kitchen.. 1 qd 7)  Prilosec 20 Mg Cpdr (Omeprazole) .... Otc  Patient Instructions: 1)  chondromalea rt knee 2)  to Xray , will call results 3)  take tylenol for pain as needed 4)  also may apply cold packs for 20 minutes then 5 than  apply heat 5)  after xrays to refer to Dr. Alvan Dame

## 2010-02-28 NOTE — Progress Notes (Signed)
Summary: REQ TO SPEAK WITH DR Joni Fears / GINA, RN  Phone Note Call from Patient   Caller: Patient [phone] Reason for Call: Talk to Nurse, Talk to Doctor Summary of Call: Pt called to speak with Dr Joni Fears.... Pt adv that she would like to have results of recent x-rays  and  she needed to speak with someone reference to mobility issues / concerns...Marland KitchenMarland Kitchen Pt can be reached at [phone].  Initial call taken by: Duanne Moron,  March 21, 2009 2:58 PM  Follow-up for Phone Call        talked to patient and to schedule appt with Dr. Adriana Mccallum Follow-up by: Emeterio Reeve MD,  March 21, 2009 6:07 PM

## 2010-02-28 NOTE — Progress Notes (Signed)
Summary: Pt req lab results  Phone Note Call from Patient Call back at Home Phone [phone]   Caller: Patient Summary of Call: Pt req lab results. Please asap.  Initial call taken by: Braulio Bosch,  February 20, 2009 2:48 PM  Follow-up for Phone Call        will call results vit D

## 2010-02-28 NOTE — Progress Notes (Signed)
Summary: Pt req scripts for Diovan and Toprol to [facility]  Phone Note Refill Request   Refills Requested: Medication #1:  DIOVAN 80 MG TABS 1 qd   Dosage confirmed as above?Dosage Confirmed   Supply Requested: 3 months  Medication #2:  TOPROL XL 25 MG  XR24H-TAB 1 by mouth once daily   Dosage confirmed as above?Dosage Confirmed   Supply Requested: 3 months Pt has [employer] and scripts have to be through [employer] order. Pt has cpx sch in Oct and will need meds filled before mid August.       Next Appointment Scheduled: 09/01/09 fasting emp Initial call taken by: Braulio Bosch,  August 14, 2009 12:04 PM  Follow-up for Phone Call        pt called again. call [phone] ([place] in [place]) or call them at [phone] Follow-up by: Despina Arias,  August 14, 2009 1:06 PM    New/Updated Medications: DIOVAN 80 MG TABS (VALSARTAN) 1 qd Prescriptions: DIOVAN 80 MG TABS (VALSARTAN) 1 qd  #90 x 1   Entered by:   Levora Angel, RN   Authorized by:   Emeterio Reeve MD   Signed by:   Levora Angel, RN on 08/14/2009   Method used:   Faxed to ...       [facility])             , [place]         Ph: [phone]       Fax: [phone]   RxID:   [id number] TOPROL XL 25 MG  XR24H-TAB (METOPROLOL SUCCINATE) 1 by mouth once daily  #90 x 1   Entered by:   Levora Angel, RN   Authorized by:   Emeterio Reeve MD   Signed by:   Levora Angel, RN on 08/14/2009   Method used:   Faxed to ...       [facility])             , [place]         Ph: [phone]       Fax: [phone]   RxID:   [id number]

## 2010-03-08 ENCOUNTER — Other Ambulatory Visit: Payer: Self-pay

## 2010-03-11 ENCOUNTER — Other Ambulatory Visit: Payer: Self-pay

## 2010-03-13 ENCOUNTER — Encounter (HOSPITAL_BASED_OUTPATIENT_CLINIC_OR_DEPARTMENT_OTHER): Payer: Medicare Other | Admitting: Oncology

## 2010-03-13 ENCOUNTER — Other Ambulatory Visit (INDEPENDENT_AMBULATORY_CARE_PROVIDER_SITE_OTHER): Payer: Medicare Other | Admitting: Family Medicine

## 2010-03-13 DIAGNOSIS — T887XXA Unspecified adverse effect of drug or medicament, initial encounter: Secondary | ICD-10-CM

## 2010-03-13 DIAGNOSIS — Z853 Personal history of malignant neoplasm of breast: Secondary | ICD-10-CM

## 2010-03-13 DIAGNOSIS — M25569 Pain in unspecified knee: Secondary | ICD-10-CM

## 2010-03-13 LAB — BASIC METABOLIC PANEL
GFR: 32.9 mL/min — ABNORMAL LOW (ref >60.00)
Glucose, Bld: 133 mg/dL — ABNORMAL HIGH (ref 70–99)
Sodium: 141 mEq/L (ref 135–145)

## 2010-04-02 ENCOUNTER — Telehealth: Payer: Self-pay | Admitting: Family Medicine

## 2010-04-02 NOTE — Telephone Encounter (Signed)
Reviewed lab results withpt, improved

## 2010-04-02 NOTE — Telephone Encounter (Signed)
Pt called to obtain results from recent labwork... Pt would like to have someone call her to advise same...Marland Kitchen pts # [phone].

## 2010-04-17 LAB — CBC
HCT: 36.3 % (ref 36.0–46.0)
Hemoglobin: 10 g/dL — ABNORMAL LOW (ref 12.0–15.0)
Hemoglobin: 10.8 g/dL — ABNORMAL LOW (ref 12.0–15.0)
MCHC: 33.4 g/dL (ref 30.0–36.0)
MCV: 89.8 fL (ref 78.0–100.0)
MCV: 91.4 fL (ref 78.0–100.0)
MCV: 92 fL (ref 78.0–100.0)
RBC: 3.25 MIL/uL — ABNORMAL LOW (ref 3.87–5.11)
RBC: 3.48 MIL/uL — ABNORMAL LOW (ref 3.87–5.11)
WBC: 6.6 10*3/uL (ref 4.0–10.5)

## 2010-04-17 LAB — DIFFERENTIAL
Eosinophils Relative: 2 % (ref 0–5)
Lymphocytes Relative: 20 % (ref 12–46)
Lymphs Abs: 1.1 10*3/uL (ref 0.7–4.0)

## 2010-04-17 LAB — BASIC METABOLIC PANEL
CO2: 25 mEq/L (ref 19–32)
Calcium: 10 mg/dL (ref 8.4–10.5)
Calcium: 9.3 mg/dL (ref 8.4–10.5)
Chloride: 107 mEq/L (ref 96–112)
Chloride: 108 mEq/L (ref 96–112)
Chloride: 110 mEq/L (ref 96–112)
Creatinine, Ser: 1.29 mg/dL — ABNORMAL HIGH (ref 0.4–1.2)
Creatinine, Ser: 1.53 mg/dL — ABNORMAL HIGH (ref 0.4–1.2)
GFR calc Af Amer: 40 mL/min — ABNORMAL LOW (ref >60)
GFR calc Af Amer: 47 mL/min — ABNORMAL LOW (ref >60)
GFR calc Af Amer: 48 mL/min — ABNORMAL LOW (ref >60)
GFR calc non Af Amer: 33 mL/min — ABNORMAL LOW (ref >60)
Glucose, Bld: 119 mg/dL — ABNORMAL HIGH (ref 70–99)
Potassium: 4 mEq/L (ref 3.5–5.1)
Sodium: 136 mEq/L (ref 135–145)
Sodium: 137 mEq/L (ref 135–145)

## 2010-04-17 LAB — URINALYSIS, ROUTINE W REFLEX MICROSCOPIC
Glucose, UA: NEGATIVE mg/dL
Nitrite: NEGATIVE
Protein, ur: NEGATIVE mg/dL
Specific Gravity, Urine: 1.022 (ref 1.005–1.030)
Urobilinogen, UA: 0.2 mg/dL (ref 0.0–1.0)

## 2010-04-17 LAB — TYPE AND SCREEN: ABO/RH(D): B POS

## 2010-04-17 LAB — ABO/RH: ABO/RH(D): B POS

## 2010-04-17 LAB — APTT: aPTT: 36 seconds (ref 24–37)

## 2010-04-17 LAB — PROTIME-INR: INR: 1.08 (ref 0.00–1.49)

## 2010-05-28 ENCOUNTER — Encounter: Payer: Self-pay | Admitting: Family Medicine

## 2010-06-14 NOTE — Procedures (Signed)
[facility]  Patient:    Kendra Bray, Kendra Bray                     MRN: [id number] Proc. Date: 01/14/00 Adm. Date:  [id number] Attending:  Harriette Bouillon CC:         Scharlene Gloss., M.D.   Procedure Report  PROCEDURE:  Endoscopy.  INDICATIONS:  A 77 year old white female with episodic abdominal pain.  The patient with a history of erosive esophagitis.  Recently seen in the emergency room three weeks ago because of a severe episode.  Maintained on Prilosec 20 mg daily.  Undergoing upper endoscopy to rule out upper GI pathology causing symptoms.  DESCRIPTION OF PROCEDURE:  After reviewing the nature of the procedure with the patient including potential risks and complications, and after discussing alternative methods of diagnosis and treatment including upper GI series, informed consent was signed.  The patient was premedicated receiving IV sedation totalling Versed 4 mg, fentanyl 50 mcg administered in divided doses prior to and during the course of the procedure.  Topical anesthetic was applied to the posterior oropharynx prior to IV sedation.  Using the Olympus video endoscope, proximal esophagus intubated under direct vision.  The oropharynx was normal without lesion of the epiglottis, vocal cords, or piriform sinus.  The proximal, mid, and distal segments of the esophagus were normal with a distinct mucosal Z-line at 34 cm.  A small hiatal hernia extending to 37 cm, noninflamed.  No evidence of reflux disease, malignancy, or infection.  The gastric fundus, body, and antrum were all normal.  Pylorus symmetric. Duodenal bulb and second portion normal.  Retroflex view of the angularis, lesser curve, gastric cardia and fundus without additional findings.  The stomach decompressed and scope withdrawn.  The patient tolerated the procedure without difficulty being maintained on Datascope monitor and low-flow oxygen throughout.  ASSESSMENT: 1.  Normal endoscopy. 2. Abdominal pain - uncertain etiology.  RECOMMENDATION: 1. Continue with Prilosec 20 mg daily. 2. MRCP scheduled for tomorrow.  Further evaluation/treatment pending results. DD:  01/14/00 TD:  01/15/00 Job: [id number]

## 2011-02-15 ENCOUNTER — Telehealth: Payer: Self-pay | Admitting: Oncology

## 2011-02-15 NOTE — Telephone Encounter (Signed)
Converted feb appt. appt for 2/15 r/s to 2/19. lmonvm for pt. Schedule mailed.

## 2011-02-25 ENCOUNTER — Ambulatory Visit (INDEPENDENT_AMBULATORY_CARE_PROVIDER_SITE_OTHER)
Admission: RE | Admit: 2011-02-25 | Discharge: 2011-02-25 | Disposition: A | Discharge status: Home / Self Care | Payer: Medicare Other | Source: Ambulatory Visit | Attending: Family Medicine | Admitting: Family Medicine

## 2011-02-25 ENCOUNTER — Ambulatory Visit (INDEPENDENT_AMBULATORY_CARE_PROVIDER_SITE_OTHER): Payer: Medicare Other | Admitting: Family Medicine

## 2011-02-25 ENCOUNTER — Encounter: Payer: Self-pay | Admitting: Family Medicine

## 2011-02-25 DIAGNOSIS — Z853 Personal history of malignant neoplasm of breast: Secondary | ICD-10-CM

## 2011-02-25 DIAGNOSIS — E669 Obesity, unspecified: Secondary | ICD-10-CM

## 2011-02-25 DIAGNOSIS — J309 Allergic rhinitis, unspecified: Secondary | ICD-10-CM | POA: Diagnosis not present

## 2011-02-25 DIAGNOSIS — I10 Essential (primary) hypertension: Secondary | ICD-10-CM | POA: Diagnosis not present

## 2011-02-25 DIAGNOSIS — I77810 Thoracic aortic ectasia: Secondary | ICD-10-CM | POA: Diagnosis not present

## 2011-02-25 DIAGNOSIS — K219 Gastro-esophageal reflux disease without esophagitis: Secondary | ICD-10-CM | POA: Diagnosis not present

## 2011-02-25 DIAGNOSIS — Z8739 Personal history of other diseases of the musculoskeletal system and connective tissue: Secondary | ICD-10-CM

## 2011-02-25 LAB — HEPATIC FUNCTION PANEL
ALT: 15 U/L (ref 0–35)
Albumin: 4.4 g/dL (ref 3.5–5.2)
Total Protein: 7.7 g/dL (ref 6.0–8.3)

## 2011-02-25 LAB — CBC WITH DIFFERENTIAL/PLATELET
Basophils Relative: 0.6 % (ref 0.0–3.0)
Eosinophils Absolute: 0.2 10*3/uL (ref 0.0–0.7)
Eosinophils Relative: 3 % (ref 0.0–5.0)
Lymphocytes Relative: 17.5 % (ref 12.0–46.0)
Neutrophils Relative %: 71 % (ref 43.0–77.0)
RBC: 4.09 Mil/uL (ref 3.87–5.11)
WBC: 5.7 10*3/uL (ref 4.5–10.5)

## 2011-02-25 LAB — TSH: TSH: 4.24 u[IU]/mL (ref 0.35–5.50)

## 2011-02-25 LAB — BASIC METABOLIC PANEL
CO2: 22 mEq/L (ref 19–32)
Calcium: 10.3 mg/dL (ref 8.4–10.5)
GFR: 33.8 mL/min — ABNORMAL LOW (ref >60.00)
Sodium: 138 mEq/L (ref 135–145)

## 2011-02-25 LAB — POCT URINALYSIS DIPSTICK
Glucose, UA: NEGATIVE
Spec Grav, UA: <=1.005
Urobilinogen, UA: 0.2

## 2011-02-25 MED ORDER — OMEPRAZOLE 40 MG PO CPDR: 40.0000 mg | DELAYED_RELEASE_CAPSULE | Freq: Every day | ORAL | Status: DC

## 2011-02-25 MED ORDER — LOSARTAN POTASSIUM 25 MG PO TABS: 25.0000 mg | ORAL_TABLET | Freq: Every day | ORAL | Status: DC

## 2011-02-25 MED ORDER — TRIAMTERENE-HCTZ 37.5-25 MG PO TABS: ORAL_TABLET | ORAL | Status: DC

## 2011-02-25 NOTE — Progress Notes (Signed)
Subjective:    Patient ID: Kendra Bray, female    DOB: [birth year], 78 y.o.   MRN: [id number]  Kendra Bray is an 79 year old widowed female nonsmoker who comes in today as a new patient for evaluation of multiple issues  14 years ago she had a left total mastectomy has been disease free since that time. She sees her oncologist Dr. Cain Sieve on a yearly basis. Since she is clinically stable probably does not need to go back for oncology followup  She's had a total right knee replacement by Dr. Charm Rings  Cholecystectomy  Childbirth x2 children live in [place]  Appendectomy and bilateral cataract removal and lens implants.  She initially stated she was allergic to prednisone Levaquin and NSAIDs however she's not. Prednisone and Levaquin cause CNS side effects and the NSAIDs cause an upset stomach no GI bleeding. She's currently on Diovan 80 mg daily for hypertension she also takes Maxzide 1 pill per week when necessary  She's been off the Toprol for about 2 months. She has a history of hernia and reflux esophagitis but takes no medication. Except amount of soft white milligrams daily  She has bursitis in her shoulder referred back to Dr. Charm Rings for followup.  Tetanus booster 2007, seasonal flu shot 2012, shingles 2007, Pneumovax x2.  She gets routine eye care, hearing diminished but she declines a hearing aid, regular dental care, BSE monthly, annual mammography.  Cognitive function normal she lives by herself. Her daughter lives in [place]  She walks on a regular basis, home health safety reviewed no issues identified, no guns in the house and she does have a health care power of attorney and living will    Review of Systems  Constitutional: Negative.   HENT: Negative.   Eyes: Negative.   Respiratory: Negative.   Cardiovascular: Negative.   Gastrointestinal: Negative.   Genitourinary: Negative.   Musculoskeletal: Negative.   Neurological: Negative.   Hematological: Negative.     Psychiatric/Behavioral: Negative.        Objective:   Physical Exam  Constitutional: She appears well-developed and well-nourished.  HENT:  Head: Normocephalic and atraumatic.  Right Ear: External ear normal.  Left Ear: External ear normal.  Nose: Nose normal.  Mouth/Throat: Oropharynx is clear and moist.  Eyes: EOM are normal. Pupils are equal, round, and reactive to light.  Neck: Normal range of motion. Neck supple. No thyromegaly present.  Cardiovascular: Normal rate, regular rhythm, normal heart sounds and intact distal pulses.  Exam reveals no gallop and no friction rub.   No murmur heard.      Bilateral faint crackles at both lower lobes,,,, will get chest x-ray  Pulmonary/Chest: Effort normal and breath sounds normal.  Abdominal: Soft. Bowel sounds are normal. She exhibits no distension and no mass. There is no tenderness. There is no rebound.  Musculoskeletal: Normal range of motion.  Lymphadenopathy:    She has no cervical adenopathy.  Neurological: She is alert. She has normal reflexes. No cranial nerve deficit. She exhibits normal muscle tone. Coordination normal.  Skin: Skin is warm and dry.  Psychiatric: She has a normal mood and affect. Her behavior is normal. Judgment and thought content normal.          Assessment & Plan:  Obesity,,,,,,,,,,, however it she's lost over 100 pound since her breast cancer  Hypertension BP too low 118/80 will stop the diuretic and cut the BP medication in half followup blood pressure check in 3 weeks  Reflux esophagitis continue omeprazole 40 mg  daily  Soreness right shoulder referred back to orthopedics  History of breast cancer 14 years ago currently asymptomatic

## 2011-02-25 NOTE — Patient Instructions (Signed)
Stop the Diovan and metaproterenol  Take Cozaar 25 mg daily in the morning and check your blood pressure daily return in 2 weeks for followup with the data and the device  He may take a diuretic once or twice weekly if you feel like he needed for fluid retention  Go to the main office now for a followup chest x-ray

## 2011-02-26 ENCOUNTER — Telehealth: Payer: Self-pay | Admitting: Family Medicine

## 2011-02-26 DIAGNOSIS — H02839 Dermatochalasis of unspecified eye, unspecified eyelid: Secondary | ICD-10-CM | POA: Diagnosis not present

## 2011-02-26 NOTE — Telephone Encounter (Signed)
Pt would like chest xray results. Please call pt after 3pm due to work

## 2011-02-27 NOTE — Telephone Encounter (Signed)
Chest x-ray shows no significant abnormality

## 2011-02-27 NOTE — Telephone Encounter (Signed)
Patient is aware.  Her mail order prescriptions have not arrived yet.  She will call back if she is close to running out.

## 2011-03-11 ENCOUNTER — Encounter: Payer: Self-pay | Admitting: Family Medicine

## 2011-03-11 ENCOUNTER — Ambulatory Visit (INDEPENDENT_AMBULATORY_CARE_PROVIDER_SITE_OTHER): Payer: Medicare Other | Admitting: Family Medicine

## 2011-03-11 DIAGNOSIS — I10 Essential (primary) hypertension: Secondary | ICD-10-CM | POA: Diagnosis not present

## 2011-03-11 NOTE — Progress Notes (Signed)
  Subjective:    Patient ID: Kendra Bray, female    DOB: 27-Dec-1927, 78 y.o.   MRN: [id number]  HPI T. Is an 78 year old female who comes in today for followup of hypertension and renal insufficiency  We have been slowly decreasing her blood pressure over time. She lost a lot of weight and with the weight loss her blood pressures dropped. She's now only on losartan 25 mg daily BP 120/70.  She also has a renal insufficiency. Her GFR is less than 60 creatinine 1.6 BUN 20. She does not recall having any diseases that cause renal insufficiency or renal damage in the past.   Review of Systems General and cardiovascular review of systems otherwise negative    Objective:   Physical Exam  Well-developed thin female in no acute distress BP right arm sitting position 120/70 pulse 60 and regular      Assessment & Plan:  I think with the weight loss her blood pressures normalized therefore will stop the Cozaar completely however we will have her monitor her blood pressure daily in the morning and return in 4 weeks for followup

## 2011-03-11 NOTE — Patient Instructions (Signed)
Stop the losartan  Check your blood pressure daily in the morning  Return in 4 weeks with the data and the device

## 2011-03-18 ENCOUNTER — Ambulatory Visit: Payer: Medicare Other | Admitting: Oncology

## 2011-03-20 DIAGNOSIS — Z1231 Encounter for screening mammogram for malignant neoplasm of breast: Secondary | ICD-10-CM | POA: Diagnosis not present

## 2011-04-02 ENCOUNTER — Other Ambulatory Visit: Payer: Self-pay | Admitting: *Deleted

## 2011-04-04 ENCOUNTER — Telehealth: Payer: Self-pay | Admitting: Oncology

## 2011-04-04 NOTE — Telephone Encounter (Signed)
S/w pt re appt for 4/23 @ 11 am.

## 2011-04-08 ENCOUNTER — Ambulatory Visit (INDEPENDENT_AMBULATORY_CARE_PROVIDER_SITE_OTHER): Payer: Medicare Other | Admitting: Family Medicine

## 2011-04-08 ENCOUNTER — Encounter: Payer: Self-pay | Admitting: Family Medicine

## 2011-04-08 VITALS — BP 118/70 | Temp 97.6°F | Wt 156.0 lb

## 2011-04-08 DIAGNOSIS — I10 Essential (primary) hypertension: Secondary | ICD-10-CM

## 2011-04-08 NOTE — Progress Notes (Signed)
  Subjective:    Patient ID: Kendra Bray, female    DOB: Jan 28, 1927, 78 y.o.   MRN: [id number]  HPI Kendra Bray is a 78 year old female who comes in today for reevaluation of hypertension  We had stopped her medication because her blood pressure dropped too low off medication her blood pressure is 120/70     Review of Systems General and cardiovascular review of systems otherwise negative    Objective:   Physical Exam Well-developed well-nourished female in no acute distress BP right arm sitting position 120/80       Assessment & Plan:  Normotensive off medication return in January for annual exam

## 2011-04-08 NOTE — Patient Instructions (Signed)
Set up a time in January for your annual Medicare wellness examination  Labs one week prior  Check your blood pressure weekly to be sure it stays normal  Return when necessary

## 2011-04-24 ENCOUNTER — Encounter: Payer: Self-pay | Admitting: Family Medicine

## 2011-04-24 ENCOUNTER — Ambulatory Visit (INDEPENDENT_AMBULATORY_CARE_PROVIDER_SITE_OTHER): Payer: Medicare Other | Admitting: Family Medicine

## 2011-04-24 VITALS — BP 120/80 | Temp 98.1°F | Wt 152.5 lb

## 2011-04-24 DIAGNOSIS — J45901 Unspecified asthma with (acute) exacerbation: Secondary | ICD-10-CM

## 2011-04-24 DIAGNOSIS — IMO0001 Reserved for inherently not codable concepts without codable children: Secondary | ICD-10-CM

## 2011-04-24 MED ORDER — HYDROCODONE-HOMATROPINE 5-1.5 MG/5ML PO SYRP: ORAL_SOLUTION | ORAL | Status: DC

## 2011-04-24 MED ORDER — PREDNISONE 20 MG PO TABS: ORAL_TABLET | ORAL | Status: DC

## 2011-04-24 NOTE — Patient Instructions (Signed)
Take the prednisone as directed  Hydromet 1/2 teaspoon 3 times daily when necessary for cough  Drink lots of water  Return on Monday for followup

## 2011-04-24 NOTE — Progress Notes (Signed)
  Subjective:    Patient ID: Kendra Bray, female    DOB: July 14, 1927, 78 y.o.   MRN: [id number]  HPI Kendra Bray is an 78 year old female nonsmoker who comes in today for evaluation of allergic rhinitis and now asthma  She states over the weekend she began having her usual spring allergy symptoms of sneezing runny nose then yesterday she began wheezing. She had an episode like this 3 or 4 years ago did not require hospitalization   Review of Systems General and pulmonary review of systems otherwise negative she was able to sleep last night    Objective:   Physical Exam Well-developed well-nourished female in acute distress HEENT negative neck was supple no adenopathy lungs are clear mild late expiratory wheezing       Assessment & Plan:  Allergic rhinitis and asthma,,,,,,,,,,, prednisone burst and taper return when necessary,

## 2011-04-28 ENCOUNTER — Ambulatory Visit (INDEPENDENT_AMBULATORY_CARE_PROVIDER_SITE_OTHER): Payer: Medicare Other | Admitting: Family Medicine

## 2011-04-28 ENCOUNTER — Encounter: Payer: Self-pay | Admitting: Family Medicine

## 2011-04-28 VITALS — BP 110/72 | Temp 97.9°F | Wt 156.0 lb

## 2011-04-28 DIAGNOSIS — IMO0001 Reserved for inherently not codable concepts without codable children: Secondary | ICD-10-CM

## 2011-04-28 DIAGNOSIS — J45901 Unspecified asthma with (acute) exacerbation: Secondary | ICD-10-CM

## 2011-04-28 DIAGNOSIS — I10 Essential (primary) hypertension: Secondary | ICD-10-CM | POA: Diagnosis not present

## 2011-04-28 NOTE — Patient Instructions (Signed)
Taper the prednisone as directed  Check a blood pressure weekly at home to be sure your blood pressure stays normal,,,,,,,,,,,, 140/90 or less,,,,,,,,,,  Return when necessary

## 2011-04-28 NOTE — Progress Notes (Signed)
  Subjective:    Patient ID: Kendra Bray, female    DOB: [birth year], 78 y.o.   MRN: [id number]  HPI Kendra Bray is an 78 year old female who comes in today for evaluation of 2 problems  We saw her a week or so ago with a viral syndrome with secondary asthma. We started on prednisone she is now down to one a day and she states her asthma spree much gone she feels almost back to normal.  Her blood pressure today was 110/72 pulse 70 and regular on no medication   Review of Systems    general cardiac and pulmonary review of systems otherwise negative Objective:   Physical Exam  general cardiopulmonary review of systems otherwise negative general cardiac pulmonary review of systems otherwise negative  Well-developed well-nourished female in acute distress HEENT negative neck was supple no adenopathy lungs are clear no wheezing cardiac exam normal BP 110/70 pulse 70 and regular       Assessment & Plan:  Asthma resolving Taper prednisone  Blood pressure normal off medication monitor BP weekly at home

## 2011-05-20 ENCOUNTER — Telehealth: Payer: Self-pay | Admitting: Oncology

## 2011-05-20 ENCOUNTER — Ambulatory Visit (HOSPITAL_BASED_OUTPATIENT_CLINIC_OR_DEPARTMENT_OTHER): Payer: Medicare Other | Admitting: Oncology

## 2011-05-20 VITALS — BP 122/70 | HR 91 | Temp 97.2°F | Ht <= 58 in | Wt 157.0 lb

## 2011-05-20 DIAGNOSIS — Z853 Personal history of malignant neoplasm of breast: Secondary | ICD-10-CM

## 2011-05-20 DIAGNOSIS — C50919 Malignant neoplasm of unspecified site of unspecified female breast: Secondary | ICD-10-CM

## 2011-05-20 NOTE — Telephone Encounter (Signed)
Gv pt appt for 05/20/2012.  scheduled mammogram on 02/21 /2014 @ [facility]

## 2011-05-20 NOTE — Progress Notes (Signed)
OFFICE PROGRESS NOTE   INTERVAL HISTORY:   She was last seen at [facility] in February of 2012. She reports having a mammogram in February of 2013 (we do not have the mammogram report available today). She recently had an episode of allergies/asthma treated with prednisone by Dr. Sherren Mocha these symptoms have improved. She otherwise feels well. She continues to work 3 days per week.  Objective:  Vital signs in last 24 hours:  Blood pressure 122/70, pulse 91, temperature 97.2 F (36.2 C), temperature source Oral, height 4' 9.75" (1.467 m), weight 157 lb (71.215 kg).    HEENT: Neck without mass Lymphatics: No cervical, supraclavicular, or axillary nodes Resp: Faint and inspiratory wheezes at the posterior chest, good air movement bilaterally, inspiratory rhonchi at the left greater than right base, no respiratory distress Cardio: Regular rate and rhythm GI: No hepatomegaly Vascular: No leg edema Breast: Status post left mastectomy. No evidence for chest wall tumor recurrence. Right breast without mass.     Medications: I have reviewed the patient's current medications.  Assessment/Plan:   Ms Mckeller was diagnosed with stage III left-sided breast cancer in July 1999.  She remains in clinical remission.  She completed 5 years of adjuvant Femara therapy beginning in August 2004.  She will continue yearly mammography.  She would like to continue followup at the cancer center.     Disposition:  She will return for an office visit in one year.   Betsy Coder, MD  05/20/2011  1:04 PM

## 2012-01-17 ENCOUNTER — Other Ambulatory Visit: Payer: Self-pay | Admitting: Family Medicine

## 2012-02-19 ENCOUNTER — Other Ambulatory Visit (INDEPENDENT_AMBULATORY_CARE_PROVIDER_SITE_OTHER): Payer: Medicare Other

## 2012-02-19 DIAGNOSIS — I10 Essential (primary) hypertension: Secondary | ICD-10-CM | POA: Diagnosis not present

## 2012-02-19 LAB — HEPATIC FUNCTION PANEL
ALT: 15 U/L (ref 0–35)
AST: 20 U/L (ref 0–37)
Albumin: 3.9 g/dL (ref 3.5–5.2)
Total Protein: 7.2 g/dL (ref 6.0–8.3)

## 2012-02-19 LAB — POCT URINALYSIS DIPSTICK
Glucose, UA: NEGATIVE
Ketones, UA: NEGATIVE
Leukocytes, UA: NEGATIVE
Spec Grav, UA: 1.01
Urobilinogen, UA: 0.2

## 2012-02-19 LAB — TSH: TSH: 3.32 u[IU]/mL (ref 0.35–5.50)

## 2012-02-19 LAB — CBC WITH DIFFERENTIAL/PLATELET
Eosinophils Relative: 3.4 % (ref 0.0–5.0)
Lymphocytes Relative: 21.5 % (ref 12.0–46.0)
Monocytes Relative: 8 % (ref 3.0–12.0)
Neutrophils Relative %: 66.2 % (ref 43.0–77.0)
Platelets: 352 10*3/uL (ref 150.0–400.0)
WBC: 4.5 10*3/uL (ref 4.5–10.5)

## 2012-02-19 LAB — LIPID PANEL
Cholesterol: 145 mg/dL (ref 0–200)
HDL: 49.5 mg/dL (ref >39.00)
Total CHOL/HDL Ratio: 3
Triglycerides: 125 mg/dL (ref 0.0–149.0)

## 2012-02-19 LAB — BASIC METABOLIC PANEL
CO2: 22 mEq/L (ref 19–32)
Chloride: 114 mEq/L — ABNORMAL HIGH (ref 96–112)
Sodium: 143 mEq/L (ref 135–145)

## 2012-02-26 ENCOUNTER — Encounter: Payer: Medicare Other | Admitting: Family Medicine

## 2012-02-26 ENCOUNTER — Ambulatory Visit (INDEPENDENT_AMBULATORY_CARE_PROVIDER_SITE_OTHER): Payer: Medicare Other | Admitting: Family Medicine

## 2012-02-26 ENCOUNTER — Encounter: Payer: Self-pay | Admitting: Family Medicine

## 2012-02-26 VITALS — BP 140/80 | Temp 97.7°F | Ht <= 58 in | Wt 152.0 lb

## 2012-02-26 DIAGNOSIS — N19 Unspecified kidney failure: Secondary | ICD-10-CM

## 2012-02-26 DIAGNOSIS — Z853 Personal history of malignant neoplasm of breast: Secondary | ICD-10-CM

## 2012-02-26 DIAGNOSIS — D539 Nutritional anemia, unspecified: Secondary | ICD-10-CM

## 2012-02-26 DIAGNOSIS — D649 Anemia, unspecified: Secondary | ICD-10-CM | POA: Diagnosis not present

## 2012-02-26 DIAGNOSIS — Z0000 Encounter for general adult medical examination without abnormal findings: Secondary | ICD-10-CM

## 2012-02-26 DIAGNOSIS — K219 Gastro-esophageal reflux disease without esophagitis: Secondary | ICD-10-CM | POA: Diagnosis not present

## 2012-02-26 DIAGNOSIS — J309 Allergic rhinitis, unspecified: Secondary | ICD-10-CM

## 2012-02-26 DIAGNOSIS — E559 Vitamin D deficiency, unspecified: Secondary | ICD-10-CM

## 2012-02-26 DIAGNOSIS — I10 Essential (primary) hypertension: Secondary | ICD-10-CM

## 2012-02-26 DIAGNOSIS — Z8739 Personal history of other diseases of the musculoskeletal system and connective tissue: Secondary | ICD-10-CM

## 2012-02-26 MED ORDER — OMEPRAZOLE 40 MG PO CPDR: DELAYED_RELEASE_CAPSULE | ORAL | Status: DC

## 2012-02-26 NOTE — Patient Instructions (Signed)
Continue your current medications  When the weather warms up walk 30 minutes daily outside  Return in one year for general physical examination sooner if any problems  I will call you the report on your lab work

## 2012-02-26 NOTE — Progress Notes (Signed)
  Subjective:    Patient ID: Kendra Bray, female    DOB: [birth year], 79 y.o.   MRN: [id number]  HPI Kendra Bray is 79 year old female nonsmoker who comes in today for a Medicare wellness examination because of a history of vitamin D deficiency osteo-arthritis, right knee replacement, reflux esophagitis, and left breast cancer  She currently takes vitamin D aspirin Prilosec 40 mg daily for reflux. She sees Dr. Cain Sieve in oncology for followup. She had her left breast totally removed for breast cancer.  She gets routine eye care, dental care, BSE monthly, and you mammography, colonoscopy done in GI, tetanus 2007, shingles 2007, seasonal flu shot 2013, Pneumovax 10 years ago  Cognitive function normal she walks on a very irregular basis. Because of her knee replacement she is very sedentary. Home health safety reviewed no issues identified, no guns in the house, she does have a health care power of attorney and a living well  Closest family members a daughter about 76 miles away. She lives alone   Review of Systems  Constitutional: Negative.   HENT: Negative.   Eyes: Negative.   Respiratory: Negative.   Cardiovascular: Negative.   Gastrointestinal: Negative.   Genitourinary: Negative.   Musculoskeletal: Negative.   Neurological: Negative.   Hematological: Negative.   Psychiatric/Behavioral: Negative.        Objective:   Physical Exam  Constitutional: She appears well-developed and well-nourished.  HENT:  Head: Normocephalic and atraumatic.  Right Ear: External ear normal.  Left Ear: External ear normal.  Nose: Nose normal.  Mouth/Throat: Oropharynx is clear and moist.  Eyes: EOM are normal. Pupils are equal, round, and reactive to light.  Neck: Normal range of motion. Neck supple. No thyromegaly present.  Cardiovascular: Normal rate, regular rhythm, normal heart sounds and intact distal pulses.  Exam reveals no gallop and no friction rub.   No murmur heard. Pulmonary/Chest:  Effort normal and breath sounds normal.  Abdominal: Soft. Bowel sounds are normal. She exhibits no distension and no mass. There is no tenderness. There is no rebound.  Genitourinary:       Right breast normal left breast surgically removed  Musculoskeletal: Normal range of motion.  Lymphadenopathy:    She has no cervical adenopathy.  Neurological: She is alert. She has normal reflexes. No cranial nerve deficit. She exhibits normal muscle tone. Coordination normal.  Skin: Skin is warm and dry.       Total body skin exam normal except for numerous freckles moles and seborrheic keratosis  Scar right knee from previous total knee replacement  Psychiatric: She has a normal mood and affect. Her behavior is normal. Judgment and thought content normal.          Assessment & Plan:  and and a reflux esophagitis continue Prilosec 40 mg daily  Vitamin D deficiency continue vitamin D supplements  Osteoarthritis  Status post left breast cancer  A new problem of anemia with hemoglobin down to 11.9 however she has a history of renal insufficiency with a creatinine of 1.4 BUN of 25 and GFR 37 therefore her anemia may be related to renal insufficiency however will check labs

## 2012-03-22 DIAGNOSIS — Z1231 Encounter for screening mammogram for malignant neoplasm of breast: Secondary | ICD-10-CM | POA: Diagnosis not present

## 2012-04-06 ENCOUNTER — Encounter: Payer: Self-pay | Admitting: Family Medicine

## 2012-05-05 ENCOUNTER — Telehealth: Payer: Self-pay | Admitting: Oncology

## 2012-05-05 NOTE — Telephone Encounter (Signed)
Talked to pt and gave her appt for May MD only

## 2012-05-20 ENCOUNTER — Ambulatory Visit: Payer: Medicare Other | Admitting: Oncology

## 2012-06-11 ENCOUNTER — Telehealth: Payer: Self-pay | Admitting: Oncology

## 2012-06-11 NOTE — Telephone Encounter (Signed)
pt stated that she wanted to cancel appt due to being OOT helping daughter.  She will call back when she wants to r/s

## 2012-06-18 ENCOUNTER — Ambulatory Visit: Payer: Medicare Other | Admitting: Oncology

## 2012-06-24 ENCOUNTER — Telehealth: Payer: Self-pay | Admitting: Oncology

## 2012-06-24 NOTE — Telephone Encounter (Signed)
Pt called r/s appt from May to July per pt rqst

## 2012-08-02 ENCOUNTER — Telehealth: Payer: Self-pay | Admitting: Oncology

## 2012-08-02 ENCOUNTER — Ambulatory Visit (HOSPITAL_BASED_OUTPATIENT_CLINIC_OR_DEPARTMENT_OTHER): Payer: Medicare Other | Admitting: Oncology

## 2012-08-02 VITALS — BP 129/75 | HR 90 | Temp 97.6°F | Resp 18 | Ht <= 58 in | Wt 160.8 lb

## 2012-08-02 DIAGNOSIS — Z853 Personal history of malignant neoplasm of breast: Secondary | ICD-10-CM
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
## 2012-08-02 NOTE — Telephone Encounter (Signed)
faxed orders to [facility] [phone]

## 2012-08-02 NOTE — Telephone Encounter (Signed)
gv and printed appt sched and avs for lpt....pt sched with [facility] for mammo 2.25.15 @ 10:30am..Marland KitchenMarland Kitchen

## 2013-02-17 ENCOUNTER — Other Ambulatory Visit: Payer: Self-pay | Admitting: Family Medicine

## 2013-02-28 ENCOUNTER — Ambulatory Visit (INDEPENDENT_AMBULATORY_CARE_PROVIDER_SITE_OTHER): Payer: Medicare Other | Admitting: Family Medicine

## 2013-02-28 ENCOUNTER — Encounter: Payer: Self-pay | Admitting: Family Medicine

## 2013-02-28 VITALS — BP 110/80 | Temp 97.8°F | Ht 58.75 in | Wt 161.0 lb

## 2013-02-28 DIAGNOSIS — I959 Hypotension, unspecified: Secondary | ICD-10-CM

## 2013-02-28 DIAGNOSIS — Z853 Personal history of malignant neoplasm of breast: Secondary | ICD-10-CM

## 2013-02-28 DIAGNOSIS — Z23 Encounter for immunization: Secondary | ICD-10-CM

## 2013-02-28 DIAGNOSIS — J309 Allergic rhinitis, unspecified: Secondary | ICD-10-CM

## 2013-02-28 DIAGNOSIS — K219 Gastro-esophageal reflux disease without esophagitis: Secondary | ICD-10-CM

## 2013-02-28 DIAGNOSIS — N19 Unspecified kidney failure: Secondary | ICD-10-CM

## 2013-02-28 DIAGNOSIS — I10 Essential (primary) hypertension: Secondary | ICD-10-CM | POA: Diagnosis not present

## 2013-02-28 DIAGNOSIS — E039 Hypothyroidism, unspecified: Secondary | ICD-10-CM | POA: Diagnosis not present

## 2013-02-28 LAB — CBC WITH DIFFERENTIAL/PLATELET
BASOS PCT: 0.7 % (ref 0.0–3.0)
Basophils Absolute: 0 10*3/uL (ref 0.0–0.1)
EOS PCT: 2 % (ref 0.0–5.0)
Eosinophils Absolute: 0.1 10*3/uL (ref 0.0–0.7)
HEMATOCRIT: 42.7 % (ref 36.0–46.0)
HEMOGLOBIN: 13.9 g/dL (ref 12.0–15.0)
LYMPHS ABS: 1 10*3/uL (ref 0.7–4.0)
LYMPHS PCT: 18.6 % (ref 12.0–46.0)
MCHC: 32.5 g/dL (ref 30.0–36.0)
MCV: 88.6 fl (ref 78.0–100.0)
Monocytes Absolute: 0.4 10*3/uL (ref 0.1–1.0)
Monocytes Relative: 8.1 % (ref 3.0–12.0)
NEUTROS ABS: 3.8 10*3/uL (ref 1.4–7.7)
Neutrophils Relative %: 70.6 % (ref 43.0–77.0)
Platelets: 362 10*3/uL (ref 150.0–400.0)
RBC: 4.82 Mil/uL (ref 3.87–5.11)
RDW: 14.4 % (ref 11.5–14.6)
WBC: 5.4 10*3/uL (ref 4.5–10.5)

## 2013-02-28 LAB — POCT URINALYSIS DIPSTICK
BILIRUBIN UA: NEGATIVE
Blood, UA: NEGATIVE
GLUCOSE UA: NEGATIVE
Ketones, UA: NEGATIVE
Leukocytes, UA: NEGATIVE
NITRITE UA: NEGATIVE
PH UA: 6
Protein, UA: NEGATIVE
SPEC GRAV UA: 1.01
Urobilinogen, UA: 0.2

## 2013-02-28 LAB — BASIC METABOLIC PANEL
BUN: 33 mg/dL — ABNORMAL HIGH (ref 6–23)
CO2: 24 mEq/L (ref 19–32)
Calcium: 10.7 mg/dL — ABNORMAL HIGH (ref 8.4–10.5)
Chloride: 105 mEq/L (ref 96–112)
Creatinine, Ser: 2 mg/dL — ABNORMAL HIGH (ref 0.4–1.2)
GFR: 24.68 mL/min — AB (ref >60.00)
Glucose, Bld: 97 mg/dL (ref 70–99)
POTASSIUM: 4.8 meq/L (ref 3.5–5.1)
SODIUM: 138 meq/L (ref 135–145)

## 2013-02-28 LAB — TSH: TSH: 3.11 u[IU]/mL (ref 0.35–5.50)

## 2013-02-28 MED ORDER — TRIAMTERENE-HCTZ 37.5-25 MG PO TABS: 1.0000 | ORAL_TABLET | Freq: Every day | ORAL | Status: DC

## 2013-02-28 MED ORDER — OMEPRAZOLE 40 MG PO CPDR: DELAYED_RELEASE_CAPSULE | ORAL | Status: DC

## 2013-02-28 NOTE — Progress Notes (Signed)
   Subjective:    Patient ID: Kendra Bray, female    DOB: August 10, 1927, 80 y.o.   MRN: [id number]  HPI Kendra Bray is a 80 year old female nonsmoker who comes in today for a Medicare wellness examination because of a history of mild hypertension, venous insufficiency, reflux esophagitis, obesity, osteoarthritis, venous insufficiency, and a history of breast cancer surgery 6 years ago.,,,,,,,, she had a total left mastectomy no reconstruction  She gets routine eye care, dental care, BSE monthly, and you mammography, she states that her colonoscopies  Vaccinations updated by Apolonio Schneiders  Cognitive function normal she walks and does aerobics at the [employer] to 6 days per week. Home health safety reviewed no issues identified, no guns in the house, she does have a health care power of attorney and living well.  She's been taking the Maxide once weekly BP 110/80 but she's having more trouble with peripheral edema. We'll increase the Maxide 3 times weekly   Review of Systems  Constitutional: Negative.   HENT: Negative.   Eyes: Negative.   Respiratory: Negative.   Cardiovascular: Negative.   Gastrointestinal: Negative.   Genitourinary: Negative.   Musculoskeletal: Negative.   Neurological: Negative.   Psychiatric/Behavioral: Negative.        Objective:   Physical Exam  Nursing note and vitals reviewed. Constitutional: She is oriented to person, place, and time. She appears well-developed and well-nourished.  HENT:  Head: Normocephalic and atraumatic.  Right Ear: External ear normal.  Left Ear: External ear normal.  Nose: Nose normal.  Mouth/Throat: Oropharynx is clear and moist.  Eyes: EOM are normal. Pupils are equal, round, and reactive to light.  Neck: Normal range of motion. Neck supple. No thyromegaly present.  Cardiovascular: Normal rate, regular rhythm, normal heart sounds and intact distal pulses.  Exam reveals no gallop and no friction rub.   No murmur heard. No carotid or aortic  bruits peripheral pulses 1+ and symmetrical  Pulmonary/Chest: Effort normal and breath sounds normal.  Abdominal: Soft. Bowel sounds are normal. She exhibits no distension and no mass. There is no tenderness. There is no rebound.  Genitourinary:  Right breast normal left breast removed surgically scar no reconstruction no palpable masses and no adenopathy  Musculoskeletal: Normal range of motion.  Lymphadenopathy:    She has no cervical adenopathy.  Neurological: She is alert and oriented to person, place, and time. She has normal reflexes. No cranial nerve deficit. She exhibits normal muscle tone. Coordination normal.  Skin: Skin is warm and dry.  Psychiatric: She has a normal mood and affect. Her behavior is normal. Judgment and thought content normal.          Assessment & Plan:  Status post breast cancer for 6 years  Reflux esophagitis continue Prilosec  Peripheral edema Maxide Monday Wednesday Friday  History of hypertension BP today 110/80

## 2013-02-28 NOTE — Progress Notes (Signed)
Pre visit review using our clinic review tool, if applicable. No additional management support is needed unless otherwise documented below in the visit note. 

## 2013-02-28 NOTE — Patient Instructions (Signed)
  Maxide,,,,,,,,,,, 1 Monday Wednesday Friday  Continue exercise program  Continue Prilosec 1 daily  Followup in 1 year sooner if any problem

## 2013-03-01 ENCOUNTER — Other Ambulatory Visit: Payer: Self-pay | Admitting: Family Medicine

## 2013-03-01 ENCOUNTER — Telehealth: Payer: Self-pay | Admitting: Family Medicine

## 2013-03-01 DIAGNOSIS — R899 Unspecified abnormal finding in specimens from other organs, systems and tissues: Secondary | ICD-10-CM

## 2013-03-01 NOTE — Telephone Encounter (Signed)
Relevant patient education mailed to patient.  

## 2013-03-10 ENCOUNTER — Telehealth: Payer: Self-pay | Admitting: Family Medicine

## 2013-03-10 NOTE — Telephone Encounter (Signed)
Was able to help pt

## 2013-03-15 ENCOUNTER — Telehealth: Payer: Self-pay | Admitting: Oncology

## 2013-03-15 NOTE — Telephone Encounter (Signed)
gave pt appt for MD on 4/3 r/s due to MD PAL

## 2013-04-04 ENCOUNTER — Ambulatory Visit: Payer: TRICARE For Life (TFL) | Admitting: Oncology

## 2013-04-20 DIAGNOSIS — Z1231 Encounter for screening mammogram for malignant neoplasm of breast: Secondary | ICD-10-CM | POA: Diagnosis not present

## 2013-04-29 ENCOUNTER — Telehealth: Payer: Self-pay | Admitting: Oncology

## 2013-04-29 ENCOUNTER — Ambulatory Visit (HOSPITAL_BASED_OUTPATIENT_CLINIC_OR_DEPARTMENT_OTHER): Payer: Medicare Other | Admitting: Oncology

## 2013-04-29 VITALS — BP 124/54 | HR 100 | Temp 98.7°F | Resp 18 | Ht <= 58 in | Wt 159.8 lb

## 2013-04-29 DIAGNOSIS — Z853 Personal history of malignant neoplasm of breast: Secondary | ICD-10-CM

## 2013-04-29 NOTE — Telephone Encounter (Signed)
gv and printed aptp sched and avs for pt for April 2016

## 2013-04-29 NOTE — Progress Notes (Signed)
  [facility] OFFICE PROGRESS NOTE   Diagnosis: Breast cancer  INTERVAL HISTORY:   Ms. Kawamura returns as scheduled. She feels well. She reports the firm nodular area at the upper right breast is resolving. She plans to relocate to [place] within the next year. She had a mammogram last month (we do not have the report available today).  Objective:  Vital signs in last 24 hours:  Blood pressure 124/54, pulse 100, temperature 98.7 F (37.1 C), temperature source Oral, resp. rate 18, height 4\' 10"  (1.473 m), weight 159 lb 12.8 oz (72.485 kg).    HEENT: Neck without mass Lymphatics: No cervical, superventricular, or axillary nodes Resp: Lungs clear bilaterally Cardio: Regular rate and rhythm GI: No hepatomegaly Vascular: No leg edema Breasts: Status post left mastectomy. No evidence for chest wall tumor recurrence. In the upper central right breast there is a less than 1 cm firm nodular area,? Similar area of firmness medial to this lesion.   Medications: I have reviewed the patient's current medications.  Assessment/Plan:  Ms.  Fones remains in clinical remission from breast cancer. She was diagnosed with stage III left-sided breast cancer in July of 1999. She was treated with 5 years of adjuvant Femara beginning in August of 2004.  Disposition:  Ms. Carico will continue yearly mammography. She plans to relocate to [place]. She would like to continue yearly followup at the [facility] if she remains in [place].  Betsy Coder, MD  04/29/2013  1:45 PM

## 2013-06-08 ENCOUNTER — Other Ambulatory Visit: Payer: Self-pay | Admitting: Nephrology

## 2013-06-08 DIAGNOSIS — N039 Chronic nephritic syndrome with unspecified morphologic changes: Secondary | ICD-10-CM | POA: Diagnosis not present

## 2013-06-08 DIAGNOSIS — N179 Acute kidney failure, unspecified: Secondary | ICD-10-CM

## 2013-06-08 DIAGNOSIS — N2581 Secondary hyperparathyroidism of renal origin: Secondary | ICD-10-CM | POA: Diagnosis not present

## 2013-06-08 DIAGNOSIS — N189 Chronic kidney disease, unspecified: Secondary | ICD-10-CM | POA: Diagnosis not present

## 2013-06-08 DIAGNOSIS — I10 Essential (primary) hypertension: Secondary | ICD-10-CM | POA: Diagnosis not present

## 2013-06-08 DIAGNOSIS — N183 Chronic kidney disease, stage 3 unspecified: Secondary | ICD-10-CM | POA: Diagnosis not present

## 2013-06-08 DIAGNOSIS — D631 Anemia in chronic kidney disease: Secondary | ICD-10-CM | POA: Diagnosis not present

## 2013-06-10 ENCOUNTER — Ambulatory Visit
Admission: RE | Admit: 2013-06-10 | Discharge: 2013-06-10 | Disposition: A | Discharge status: Home / Self Care | Payer: Medicare Other | Source: Ambulatory Visit | Attending: Nephrology | Admitting: Nephrology

## 2013-06-10 DIAGNOSIS — N179 Acute kidney failure, unspecified: Secondary | ICD-10-CM

## 2013-08-16 DIAGNOSIS — N179 Acute kidney failure, unspecified: Secondary | ICD-10-CM | POA: Diagnosis not present

## 2013-08-26 DIAGNOSIS — D631 Anemia in chronic kidney disease: Secondary | ICD-10-CM | POA: Diagnosis not present

## 2013-08-26 DIAGNOSIS — N183 Chronic kidney disease, stage 3 unspecified: Secondary | ICD-10-CM | POA: Diagnosis not present

## 2013-08-26 DIAGNOSIS — I10 Essential (primary) hypertension: Secondary | ICD-10-CM | POA: Diagnosis not present

## 2013-08-26 DIAGNOSIS — N2581 Secondary hyperparathyroidism of renal origin: Secondary | ICD-10-CM | POA: Diagnosis not present

## 2013-09-08 ENCOUNTER — Encounter: Payer: Self-pay | Admitting: *Deleted

## 2013-09-08 NOTE — Progress Notes (Signed)
Received office note from [facility] (Dr. Posey Pronto). Reviewed by Dr. Benay Spice. Suggests she follow up on the elevated calcium level with Dr. Sherren Mocha. Message to Dr. Honor Junes office.

## 2013-12-15 DIAGNOSIS — N2581 Secondary hyperparathyroidism of renal origin: Secondary | ICD-10-CM | POA: Diagnosis not present

## 2013-12-15 DIAGNOSIS — N183 Chronic kidney disease, stage 3 (moderate): Secondary | ICD-10-CM | POA: Diagnosis not present

## 2013-12-15 DIAGNOSIS — N189 Chronic kidney disease, unspecified: Secondary | ICD-10-CM | POA: Diagnosis not present

## 2013-12-19 DIAGNOSIS — N184 Chronic kidney disease, stage 4 (severe): Secondary | ICD-10-CM | POA: Diagnosis not present

## 2013-12-19 DIAGNOSIS — D631 Anemia in chronic kidney disease: Secondary | ICD-10-CM | POA: Diagnosis not present

## 2013-12-19 DIAGNOSIS — N2581 Secondary hyperparathyroidism of renal origin: Secondary | ICD-10-CM | POA: Diagnosis not present

## 2013-12-19 DIAGNOSIS — I10 Essential (primary) hypertension: Secondary | ICD-10-CM | POA: Diagnosis not present

## 2013-12-26 DIAGNOSIS — N184 Chronic kidney disease, stage 4 (severe): Secondary | ICD-10-CM | POA: Diagnosis not present

## 2014-01-16 ENCOUNTER — Other Ambulatory Visit: Payer: Self-pay | Admitting: Family Medicine

## 2014-01-24 DIAGNOSIS — N184 Chronic kidney disease, stage 4 (severe): Secondary | ICD-10-CM | POA: Diagnosis not present

## 2014-02-24 ENCOUNTER — Other Ambulatory Visit: Payer: Self-pay | Admitting: Family Medicine

## 2014-03-21 DIAGNOSIS — N2581 Secondary hyperparathyroidism of renal origin: Secondary | ICD-10-CM | POA: Diagnosis not present

## 2014-03-21 DIAGNOSIS — N184 Chronic kidney disease, stage 4 (severe): Secondary | ICD-10-CM | POA: Diagnosis not present

## 2014-03-21 DIAGNOSIS — N189 Chronic kidney disease, unspecified: Secondary | ICD-10-CM | POA: Diagnosis not present

## 2014-03-23 DIAGNOSIS — N184 Chronic kidney disease, stage 4 (severe): Secondary | ICD-10-CM | POA: Diagnosis not present

## 2014-03-23 DIAGNOSIS — I10 Essential (primary) hypertension: Secondary | ICD-10-CM | POA: Diagnosis not present

## 2014-03-23 DIAGNOSIS — N2581 Secondary hyperparathyroidism of renal origin: Secondary | ICD-10-CM | POA: Diagnosis not present

## 2014-03-23 DIAGNOSIS — D631 Anemia in chronic kidney disease: Secondary | ICD-10-CM | POA: Diagnosis not present

## 2014-04-25 DIAGNOSIS — Z1231 Encounter for screening mammogram for malignant neoplasm of breast: Secondary | ICD-10-CM | POA: Diagnosis not present

## 2014-04-25 DIAGNOSIS — Z853 Personal history of malignant neoplasm of breast: Secondary | ICD-10-CM | POA: Diagnosis not present

## 2014-04-25 LAB — HM MAMMOGRAPHY: HM Mammogram: NORMAL

## 2014-04-26 ENCOUNTER — Encounter: Payer: Self-pay | Admitting: Family Medicine

## 2014-05-01 ENCOUNTER — Telehealth: Payer: Self-pay | Admitting: Oncology

## 2014-05-01 ENCOUNTER — Ambulatory Visit (HOSPITAL_BASED_OUTPATIENT_CLINIC_OR_DEPARTMENT_OTHER): Payer: Medicare Other | Admitting: Oncology

## 2014-05-01 VITALS — BP 142/60 | HR 96 | Temp 97.5°F | Resp 17 | Ht <= 58 in | Wt 166.7 lb

## 2014-05-01 DIAGNOSIS — Z853 Personal history of malignant neoplasm of breast: Secondary | ICD-10-CM | POA: Diagnosis not present

## 2014-05-01 NOTE — Progress Notes (Signed)
  [facility] OFFICE PROGRESS NOTE   Diagnosis: Breast cancer  INTERVAL HISTORY:   Ms. Smaii returns as scheduled. She feels well. No change in the firm area at the right upper breast. A right mammogram on 04/25/2014 was negative. A palpable area on the right breast corresponds to a benign 1.5 Zometa oral cyst consistent with fat necrosis.  She is now being followed by nephrology for renal failure.  Objective:  Vital signs in last 24 hours:  Blood pressure 142/60, pulse 96, temperature 97.5 F (36.4 C), temperature source Oral, resp. rate 17, height 4\' 10"  (1.473 m), weight 166 lb 11.2 oz (75.615 kg), SpO2 99 %.    HEENT: Neck without mass Lymphatics: No cervical, supra-clavicular, or axillary nodes Resp: End inspiratory rales at the left greater than right base, no respiratory distress Cardio: Regular rate and rhythm GI: No hepatomegaly Vascular: No leg edema Breasts: Status post left mastectomy. No evidence for chest wall tumor recurrence. At the central upper right breast, 12:00 position approximately 5-6 cm outside of the areola, there is a 1 cm firm nodule    Medications: I have reviewed the patient's current medications.  Assessment/Plan:  Ms. Cozzens remains in clinical remission from breast cancer. She was diagnosed with stage III left-sided breast cancer in July of 1999. She was treated with 5 years of adjuvant Femara beginning in August of 2004.    Disposition:  Ms. Cotrell will like to continue follow-up at the [facility]. She continues yearly mammography. She will return for an office visit in one year.  Betsy Coder, MD  05/01/2014  11:17 AM

## 2014-05-01 NOTE — Telephone Encounter (Signed)
Gave avs & calendar for April 2017. °

## 2014-05-05 ENCOUNTER — Other Ambulatory Visit: Payer: Self-pay | Admitting: Family Medicine

## 2014-05-08 ENCOUNTER — Ambulatory Visit (INDEPENDENT_AMBULATORY_CARE_PROVIDER_SITE_OTHER): Payer: Medicare Other | Admitting: Podiatry

## 2014-05-08 ENCOUNTER — Encounter: Payer: Self-pay | Admitting: Podiatry

## 2014-05-08 VITALS — BP 142/87 | HR 84 | Resp 12

## 2014-05-08 DIAGNOSIS — L608 Other nail disorders: Secondary | ICD-10-CM

## 2014-05-08 DIAGNOSIS — L609 Nail disorder, unspecified: Secondary | ICD-10-CM | POA: Diagnosis not present

## 2014-05-08 NOTE — Patient Instructions (Signed)
Okay to go to pedicurist to have toenails trimmed Do not use the whirlpool Do not manipulate the cuticles

## 2014-05-08 NOTE — Progress Notes (Signed)
   Subjective:    Patient ID: Kendra Bray, female    DOB: [birth year], 81 y.o.   MRN: [id number]  HPI  N- SORE, THICK NAIL L-B/L TOENAILS D-LONG-TERM O-SLOWLY C-LONGER A-SHOES, PRESSURE T-SON IN LAW TRIM THEM Patient denies any pain associated with nails. She has difficulty reaching and trimming her toenails  Review of Systems  Cardiovascular: Positive for leg swelling.       Objective:   Physical Exam  Orientated 3  Vascular: DP and PT pulses 2/4 bilaterally  Neurological: Vibratory sensation nonreactive bilaterally Ankle reflex equal and reactive bilaterally Sensation to 10 g monofilament wire intact 5/5 bilaterally  Dermatological: Plantar keratoses third right MPJ Incurvated toenails 10 with normal trophic nail plates  Musculoskeletal: HAV deformities bilaterally Pes planus bilaterally      Assessment & Plan:   Assessment: Satisfactory neurovascular status Non-dystrophic toenails 10  Plan: Debrided toenails 10 as non-dystrophic I recommended patient to go to pedicurist for follow-up debridement of toenails

## 2014-06-24 DIAGNOSIS — L03115 Cellulitis of right lower limb: Secondary | ICD-10-CM | POA: Diagnosis not present

## 2014-06-25 ENCOUNTER — Other Ambulatory Visit: Payer: Self-pay | Admitting: Family Medicine

## 2014-06-25 DIAGNOSIS — L03115 Cellulitis of right lower limb: Secondary | ICD-10-CM | POA: Diagnosis not present

## 2014-06-29 DIAGNOSIS — M86171 Other acute osteomyelitis, right ankle and foot: Secondary | ICD-10-CM | POA: Diagnosis not present

## 2014-06-30 ENCOUNTER — Telehealth: Payer: Self-pay | Admitting: *Deleted

## 2014-06-30 NOTE — Telephone Encounter (Signed)
Call from [place] with [employer] [facility] to follow up on [id number] prosthesis order that was faxed. Informed her signed order has been sent to our HIM department to be faxed and scanned. Should receive it today or Monday.

## 2014-07-03 DIAGNOSIS — M1A0710 Idiopathic chronic gout, right ankle and foot, without tophus (tophi): Secondary | ICD-10-CM | POA: Diagnosis not present

## 2014-07-04 DIAGNOSIS — M109 Gout, unspecified: Secondary | ICD-10-CM | POA: Diagnosis not present

## 2014-07-04 DIAGNOSIS — I10 Essential (primary) hypertension: Secondary | ICD-10-CM | POA: Diagnosis not present

## 2014-07-04 DIAGNOSIS — N189 Chronic kidney disease, unspecified: Secondary | ICD-10-CM | POA: Diagnosis not present

## 2014-07-24 ENCOUNTER — Other Ambulatory Visit: Payer: Self-pay

## 2014-08-01 ENCOUNTER — Other Ambulatory Visit: Payer: Self-pay | Admitting: Family Medicine

## 2014-08-16 DIAGNOSIS — N184 Chronic kidney disease, stage 4 (severe): Secondary | ICD-10-CM | POA: Diagnosis not present

## 2014-08-16 DIAGNOSIS — E790 Hyperuricemia without signs of inflammatory arthritis and tophaceous disease: Secondary | ICD-10-CM | POA: Diagnosis not present

## 2014-09-04 DIAGNOSIS — I10 Essential (primary) hypertension: Secondary | ICD-10-CM | POA: Diagnosis not present

## 2014-09-04 DIAGNOSIS — N184 Chronic kidney disease, stage 4 (severe): Secondary | ICD-10-CM | POA: Diagnosis not present

## 2014-09-04 DIAGNOSIS — D631 Anemia in chronic kidney disease: Secondary | ICD-10-CM | POA: Diagnosis not present

## 2014-09-04 DIAGNOSIS — N2581 Secondary hyperparathyroidism of renal origin: Secondary | ICD-10-CM | POA: Diagnosis not present

## 2014-09-24 ENCOUNTER — Other Ambulatory Visit: Payer: Self-pay | Admitting: Family Medicine

## 2015-01-25 ENCOUNTER — Telehealth: Payer: Self-pay | Admitting: Family Medicine

## 2015-01-25 MED ORDER — TRIAMTERENE-HCTZ 37.5-25 MG PO TABS: 1.0000 | ORAL_TABLET | Freq: Every day | ORAL | Status: DC

## 2015-01-25 NOTE — Telephone Encounter (Signed)
Pt request refill of the following:   triamterene-hydrochlorothiazide (MAXZIDE-25) 37.5-25 MG per tablet   90 day supply    Phamacy: [facility]

## 2015-03-15 DIAGNOSIS — E790 Hyperuricemia without signs of inflammatory arthritis and tophaceous disease: Secondary | ICD-10-CM | POA: Diagnosis not present

## 2015-03-15 DIAGNOSIS — N184 Chronic kidney disease, stage 4 (severe): Secondary | ICD-10-CM | POA: Diagnosis not present

## 2015-03-15 DIAGNOSIS — N2581 Secondary hyperparathyroidism of renal origin: Secondary | ICD-10-CM | POA: Diagnosis not present

## 2015-03-15 DIAGNOSIS — N189 Chronic kidney disease, unspecified: Secondary | ICD-10-CM | POA: Diagnosis not present

## 2015-03-22 DIAGNOSIS — N184 Chronic kidney disease, stage 4 (severe): Secondary | ICD-10-CM | POA: Diagnosis not present

## 2015-03-22 DIAGNOSIS — I10 Essential (primary) hypertension: Secondary | ICD-10-CM | POA: Diagnosis not present

## 2015-03-22 DIAGNOSIS — N2581 Secondary hyperparathyroidism of renal origin: Secondary | ICD-10-CM | POA: Diagnosis not present

## 2015-03-22 DIAGNOSIS — D631 Anemia in chronic kidney disease: Secondary | ICD-10-CM | POA: Diagnosis not present

## 2015-03-22 DIAGNOSIS — E790 Hyperuricemia without signs of inflammatory arthritis and tophaceous disease: Secondary | ICD-10-CM | POA: Diagnosis not present

## 2015-03-28 DIAGNOSIS — N184 Chronic kidney disease, stage 4 (severe): Secondary | ICD-10-CM | POA: Diagnosis not present

## 2015-04-08 DIAGNOSIS — R05 Cough: Secondary | ICD-10-CM | POA: Diagnosis not present

## 2015-04-08 DIAGNOSIS — J209 Acute bronchitis, unspecified: Secondary | ICD-10-CM | POA: Diagnosis not present

## 2015-04-30 ENCOUNTER — Telehealth: Payer: Self-pay | Admitting: Oncology

## 2015-04-30 ENCOUNTER — Ambulatory Visit (HOSPITAL_BASED_OUTPATIENT_CLINIC_OR_DEPARTMENT_OTHER): Payer: Medicare Other | Admitting: Oncology

## 2015-04-30 VITALS — BP 133/53 | HR 93 | Temp 97.9°F | Resp 18 | Ht <= 58 in | Wt 157.0 lb

## 2015-04-30 DIAGNOSIS — Z853 Personal history of malignant neoplasm of breast: Secondary | ICD-10-CM

## 2015-04-30 NOTE — Telephone Encounter (Signed)
Gave and printed appt shced and avs for pt for April 2018

## 2015-04-30 NOTE — Progress Notes (Signed)
  [facility] OFFICE PROGRESS NOTE   Diagnosis:  Breast cancer  INTERVAL HISTORY:    Kendra Bray returns as scheduled. She reports being diagnosed with gout last summer. She has lost weight by changing her diet. She is maintained on allopurinol. No change at the chest wall or right breast. She is due for a mammogram. She recently had an upper respiratory infection. She is followed by  Nephrology for renal failure.  Objective:  Vital signs in last 24 hours:  Blood pressure 133/53, pulse 93, temperature 97.9 F (36.6 C), temperature source Oral, resp. rate 18, height 4\' 10"  (1.473 m), weight 157 lb (71.215 kg), SpO2 100 %.    HEENT:  Neck without mass Lymphatics:  No cervical, supraclavicular, or axillary nodes. Diffuse firmness in the left supra-clavicular fossa Resp:  Lungs clear bilaterally Cardio:  Regular rate and rhythm GI:  No hepatomegaly Vascular:  No leg edema  breasts: status post left mastectomy. No evidence for chest wall tumor recurrence.  There is a less than 1 cm firm nodule at the right breast 12:00 position 5-6 cm outside of the areola  Portacath/PICC-without erythema  Lab Results:  Lab Results  Component Value Date   WBC 5.4 02/28/2013   HGB 13.9 02/28/2013   HCT 42.7 02/28/2013   MCV 88.6 02/28/2013   PLT 362.0 02/28/2013   NEUTROABS 3.8 02/28/2013     Medications: I have reviewed the patient's current medications.  Assessment/Plan:   Kendra Bray remains in clinical remission from breast cancer. She was diagnosed with stage III left-sided breast cancer in July 1999. She completed adjuvant treatment with 5 years of Femara beginning in August 2004.  Disposition:   she will schedule a right mammogram this week. Kendra Bray would like to continue follow-up at the [facility]. She will return for an office visit in one year.  Betsy Coder, MD  04/30/2015  11:21 AM

## 2015-05-07 DIAGNOSIS — Z1231 Encounter for screening mammogram for malignant neoplasm of breast: Secondary | ICD-10-CM | POA: Diagnosis not present

## 2015-05-07 DIAGNOSIS — Z853 Personal history of malignant neoplasm of breast: Secondary | ICD-10-CM | POA: Diagnosis not present

## 2015-06-13 ENCOUNTER — Encounter: Payer: Medicare Other | Admitting: Family Medicine

## 2015-09-10 DIAGNOSIS — M109 Gout, unspecified: Secondary | ICD-10-CM | POA: Diagnosis not present

## 2015-09-10 DIAGNOSIS — M19031 Primary osteoarthritis, right wrist: Secondary | ICD-10-CM | POA: Diagnosis not present

## 2015-09-10 DIAGNOSIS — Z0000 Encounter for general adult medical examination without abnormal findings: Secondary | ICD-10-CM | POA: Diagnosis not present

## 2015-09-10 DIAGNOSIS — E782 Mixed hyperlipidemia: Secondary | ICD-10-CM | POA: Diagnosis not present

## 2015-09-10 DIAGNOSIS — I10 Essential (primary) hypertension: Secondary | ICD-10-CM | POA: Diagnosis not present

## 2015-10-10 DIAGNOSIS — N2581 Secondary hyperparathyroidism of renal origin: Secondary | ICD-10-CM | POA: Diagnosis not present

## 2015-10-10 DIAGNOSIS — N184 Chronic kidney disease, stage 4 (severe): Secondary | ICD-10-CM | POA: Diagnosis not present

## 2015-10-17 DIAGNOSIS — Z6833 Body mass index (BMI) 33.0-33.9, adult: Secondary | ICD-10-CM | POA: Diagnosis not present

## 2015-10-17 DIAGNOSIS — N2581 Secondary hyperparathyroidism of renal origin: Secondary | ICD-10-CM | POA: Diagnosis not present

## 2015-10-17 DIAGNOSIS — N183 Chronic kidney disease, stage 3 (moderate): Secondary | ICD-10-CM | POA: Diagnosis not present

## 2015-10-17 DIAGNOSIS — D631 Anemia in chronic kidney disease: Secondary | ICD-10-CM | POA: Diagnosis not present

## 2015-10-17 DIAGNOSIS — N189 Chronic kidney disease, unspecified: Secondary | ICD-10-CM | POA: Diagnosis not present

## 2015-10-17 DIAGNOSIS — I10 Essential (primary) hypertension: Secondary | ICD-10-CM | POA: Diagnosis not present

## 2016-04-11 ENCOUNTER — Ambulatory Visit
Admission: RE | Admit: 2016-04-11 | Discharge: 2016-04-11 | Disposition: A | Discharge status: Home / Self Care | Payer: Medicare Other | Source: Ambulatory Visit | Attending: Family Medicine | Admitting: Family Medicine

## 2016-04-11 ENCOUNTER — Other Ambulatory Visit: Payer: Self-pay | Admitting: Family Medicine

## 2016-04-11 DIAGNOSIS — M25551 Pain in right hip: Secondary | ICD-10-CM | POA: Diagnosis not present

## 2016-04-11 DIAGNOSIS — M25552 Pain in left hip: Secondary | ICD-10-CM | POA: Diagnosis not present

## 2016-05-01 DIAGNOSIS — N183 Chronic kidney disease, stage 3 (moderate): Secondary | ICD-10-CM | POA: Diagnosis not present

## 2016-05-01 DIAGNOSIS — N2581 Secondary hyperparathyroidism of renal origin: Secondary | ICD-10-CM | POA: Diagnosis not present

## 2016-05-05 ENCOUNTER — Ambulatory Visit: Payer: Medicare Other | Admitting: Oncology

## 2016-05-09 ENCOUNTER — Other Ambulatory Visit (HOSPITAL_COMMUNITY): Payer: Self-pay | Admitting: Nephrology

## 2016-05-09 DIAGNOSIS — I10 Essential (primary) hypertension: Secondary | ICD-10-CM | POA: Diagnosis not present

## 2016-05-09 DIAGNOSIS — Z6833 Body mass index (BMI) 33.0-33.9, adult: Secondary | ICD-10-CM | POA: Diagnosis not present

## 2016-05-09 DIAGNOSIS — N2581 Secondary hyperparathyroidism of renal origin: Secondary | ICD-10-CM | POA: Diagnosis not present

## 2016-05-09 DIAGNOSIS — N183 Chronic kidney disease, stage 3 (moderate): Secondary | ICD-10-CM | POA: Diagnosis not present

## 2016-05-09 DIAGNOSIS — D631 Anemia in chronic kidney disease: Secondary | ICD-10-CM | POA: Diagnosis not present

## 2016-05-15 ENCOUNTER — Encounter (HOSPITAL_COMMUNITY)
Admission: RE | Admit: 2016-05-15 | Discharge: 2016-05-15 | Disposition: A | Discharge status: Home / Self Care | Payer: Medicare Other | Source: Ambulatory Visit | Attending: Nephrology | Admitting: Nephrology

## 2016-05-15 MED ORDER — TECHNETIUM TC 99M SESTAMIBI GENERIC - CARDIOLITE
25.0000 | Freq: Once | INTRAVENOUS | Status: AC | PRN
  Administered 2016-05-15: 25 via INTRAVENOUS

## 2016-09-17 DIAGNOSIS — Z136 Encounter for screening for cardiovascular disorders: Secondary | ICD-10-CM | POA: Diagnosis not present

## 2016-09-17 DIAGNOSIS — Z6835 Body mass index (BMI) 35.0-35.9, adult: Secondary | ICD-10-CM | POA: Diagnosis not present

## 2016-09-17 DIAGNOSIS — I10 Essential (primary) hypertension: Secondary | ICD-10-CM | POA: Diagnosis not present

## 2016-09-17 DIAGNOSIS — Z0000 Encounter for general adult medical examination without abnormal findings: Secondary | ICD-10-CM | POA: Diagnosis not present

## 2016-09-17 DIAGNOSIS — M109 Gout, unspecified: Secondary | ICD-10-CM | POA: Diagnosis not present

## 2016-09-17 DIAGNOSIS — Z6833 Body mass index (BMI) 33.0-33.9, adult: Secondary | ICD-10-CM | POA: Diagnosis not present

## 2016-09-17 DIAGNOSIS — E611 Iron deficiency: Secondary | ICD-10-CM | POA: Diagnosis not present

## 2016-10-17 ENCOUNTER — Encounter: Payer: Self-pay | Admitting: Family Medicine

## 2017-01-28 DIAGNOSIS — N183 Chronic kidney disease, stage 3 (moderate): Secondary | ICD-10-CM | POA: Diagnosis not present

## 2017-02-02 DIAGNOSIS — D631 Anemia in chronic kidney disease: Secondary | ICD-10-CM | POA: Diagnosis not present

## 2017-02-02 DIAGNOSIS — N2581 Secondary hyperparathyroidism of renal origin: Secondary | ICD-10-CM | POA: Diagnosis not present

## 2017-02-02 DIAGNOSIS — N183 Chronic kidney disease, stage 3 (moderate): Secondary | ICD-10-CM | POA: Diagnosis not present

## 2017-02-02 DIAGNOSIS — I129 Hypertensive chronic kidney disease with stage 1 through stage 4 chronic kidney disease, or unspecified chronic kidney disease: Secondary | ICD-10-CM | POA: Diagnosis not present

## 2017-02-02 DIAGNOSIS — N189 Chronic kidney disease, unspecified: Secondary | ICD-10-CM | POA: Diagnosis not present

## 2017-07-21 DIAGNOSIS — M25772 Osteophyte, left ankle: Secondary | ICD-10-CM | POA: Diagnosis not present

## 2017-07-21 DIAGNOSIS — M25771 Osteophyte, right ankle: Secondary | ICD-10-CM | POA: Diagnosis not present

## 2017-08-13 DIAGNOSIS — M79676 Pain in unspecified toe(s): Secondary | ICD-10-CM | POA: Diagnosis not present

## 2017-08-13 DIAGNOSIS — R202 Paresthesia of skin: Secondary | ICD-10-CM | POA: Diagnosis not present

## 2017-08-13 DIAGNOSIS — G5601 Carpal tunnel syndrome, right upper limb: Secondary | ICD-10-CM | POA: Diagnosis not present

## 2017-08-13 DIAGNOSIS — E538 Deficiency of other specified B group vitamins: Secondary | ICD-10-CM | POA: Diagnosis not present

## 2017-08-13 DIAGNOSIS — E531 Pyridoxine deficiency: Secondary | ICD-10-CM | POA: Diagnosis not present

## 2017-08-13 DIAGNOSIS — R634 Abnormal weight loss: Secondary | ICD-10-CM | POA: Diagnosis not present

## 2017-08-13 DIAGNOSIS — G609 Hereditary and idiopathic neuropathy, unspecified: Secondary | ICD-10-CM | POA: Diagnosis not present

## 2017-08-13 DIAGNOSIS — G603 Idiopathic progressive neuropathy: Secondary | ICD-10-CM | POA: Diagnosis not present

## 2017-08-13 DIAGNOSIS — E559 Vitamin D deficiency, unspecified: Secondary | ICD-10-CM | POA: Diagnosis not present

## 2017-08-13 DIAGNOSIS — Z91018 Allergy to other foods: Secondary | ICD-10-CM | POA: Diagnosis not present

## 2017-08-13 DIAGNOSIS — M5417 Radiculopathy, lumbosacral region: Secondary | ICD-10-CM | POA: Diagnosis not present

## 2017-09-10 DIAGNOSIS — R252 Cramp and spasm: Secondary | ICD-10-CM | POA: Diagnosis not present

## 2017-09-10 DIAGNOSIS — M545 Low back pain: Secondary | ICD-10-CM | POA: Diagnosis not present

## 2017-09-10 DIAGNOSIS — E538 Deficiency of other specified B group vitamins: Secondary | ICD-10-CM | POA: Diagnosis not present

## 2017-09-10 DIAGNOSIS — G603 Idiopathic progressive neuropathy: Secondary | ICD-10-CM | POA: Diagnosis not present

## 2017-09-22 DIAGNOSIS — Z13220 Encounter for screening for lipoid disorders: Secondary | ICD-10-CM | POA: Diagnosis not present

## 2017-09-22 DIAGNOSIS — R7303 Prediabetes: Secondary | ICD-10-CM | POA: Diagnosis not present

## 2017-09-22 DIAGNOSIS — E538 Deficiency of other specified B group vitamins: Secondary | ICD-10-CM | POA: Diagnosis not present

## 2017-09-22 DIAGNOSIS — Z23 Encounter for immunization: Secondary | ICD-10-CM | POA: Diagnosis not present

## 2017-09-22 DIAGNOSIS — Z0000 Encounter for general adult medical examination without abnormal findings: Secondary | ICD-10-CM | POA: Diagnosis not present

## 2017-09-22 DIAGNOSIS — I10 Essential (primary) hypertension: Secondary | ICD-10-CM | POA: Diagnosis not present

## 2017-10-01 DIAGNOSIS — N183 Chronic kidney disease, stage 3 (moderate): Secondary | ICD-10-CM | POA: Diagnosis not present

## 2017-10-22 DIAGNOSIS — M545 Low back pain: Secondary | ICD-10-CM | POA: Diagnosis not present

## 2017-10-22 DIAGNOSIS — R202 Paresthesia of skin: Secondary | ICD-10-CM | POA: Diagnosis not present

## 2017-10-22 DIAGNOSIS — G5603 Carpal tunnel syndrome, bilateral upper limbs: Secondary | ICD-10-CM | POA: Diagnosis not present

## 2017-10-23 DIAGNOSIS — N2581 Secondary hyperparathyroidism of renal origin: Secondary | ICD-10-CM | POA: Diagnosis not present

## 2017-10-23 DIAGNOSIS — D631 Anemia in chronic kidney disease: Secondary | ICD-10-CM | POA: Diagnosis not present

## 2017-10-23 DIAGNOSIS — I129 Hypertensive chronic kidney disease with stage 1 through stage 4 chronic kidney disease, or unspecified chronic kidney disease: Secondary | ICD-10-CM | POA: Diagnosis not present

## 2017-10-23 DIAGNOSIS — N183 Chronic kidney disease, stage 3 (moderate): Secondary | ICD-10-CM | POA: Diagnosis not present

## 2017-10-27 DIAGNOSIS — N2581 Secondary hyperparathyroidism of renal origin: Secondary | ICD-10-CM | POA: Diagnosis not present

## 2017-12-03 DIAGNOSIS — M545 Low back pain: Secondary | ICD-10-CM | POA: Diagnosis not present

## 2017-12-03 DIAGNOSIS — R252 Cramp and spasm: Secondary | ICD-10-CM | POA: Diagnosis not present

## 2017-12-03 DIAGNOSIS — G603 Idiopathic progressive neuropathy: Secondary | ICD-10-CM | POA: Diagnosis not present

## 2017-12-03 DIAGNOSIS — F5101 Primary insomnia: Secondary | ICD-10-CM | POA: Diagnosis not present

## 2018-01-28 DIAGNOSIS — M545 Low back pain: Secondary | ICD-10-CM | POA: Diagnosis not present

## 2018-01-28 DIAGNOSIS — G603 Idiopathic progressive neuropathy: Secondary | ICD-10-CM | POA: Diagnosis not present

## 2018-01-28 DIAGNOSIS — G5603 Carpal tunnel syndrome, bilateral upper limbs: Secondary | ICD-10-CM | POA: Diagnosis not present

## 2018-01-28 DIAGNOSIS — F5101 Primary insomnia: Secondary | ICD-10-CM | POA: Diagnosis not present

## 2018-01-28 DIAGNOSIS — G3184 Mild cognitive impairment, so stated: Secondary | ICD-10-CM | POA: Diagnosis not present

## 2018-02-05 DIAGNOSIS — N183 Chronic kidney disease, stage 3 (moderate): Secondary | ICD-10-CM | POA: Diagnosis not present

## 2018-02-09 ENCOUNTER — Emergency Department (HOSPITAL_COMMUNITY): Payer: Medicare Other

## 2018-02-09 ENCOUNTER — Inpatient Hospital Stay (HOSPITAL_COMMUNITY)
Admission: EM | Admit: 2018-02-09 | Discharge: 2018-02-24 | DRG: 308 | Disposition: A | Discharge status: Hospice | Payer: Medicare Other | Attending: Internal Medicine | Admitting: Internal Medicine

## 2018-02-09 ENCOUNTER — Encounter (HOSPITAL_COMMUNITY): Payer: Self-pay | Admitting: Emergency Medicine

## 2018-02-09 ENCOUNTER — Inpatient Hospital Stay (HOSPITAL_COMMUNITY): Payer: Medicare Other

## 2018-02-09 ENCOUNTER — Other Ambulatory Visit: Payer: Self-pay

## 2018-02-09 DIAGNOSIS — H9190 Unspecified hearing loss, unspecified ear: Secondary | ICD-10-CM | POA: Diagnosis present

## 2018-02-09 DIAGNOSIS — I444 Left anterior fascicular block: Secondary | ICD-10-CM | POA: Diagnosis present

## 2018-02-09 DIAGNOSIS — Z9049 Acquired absence of other specified parts of digestive tract: Secondary | ICD-10-CM

## 2018-02-09 DIAGNOSIS — Z9010 Acquired absence of unspecified breast and nipple: Secondary | ICD-10-CM

## 2018-02-09 DIAGNOSIS — I480 Paroxysmal atrial fibrillation: Secondary | ICD-10-CM | POA: Diagnosis not present

## 2018-02-09 DIAGNOSIS — Z66 Do not resuscitate: Secondary | ICD-10-CM | POA: Diagnosis not present

## 2018-02-09 DIAGNOSIS — R079 Chest pain, unspecified: Secondary | ICD-10-CM | POA: Diagnosis not present

## 2018-02-09 DIAGNOSIS — Z515 Encounter for palliative care: Secondary | ICD-10-CM | POA: Diagnosis present

## 2018-02-09 DIAGNOSIS — I5021 Acute systolic (congestive) heart failure: Secondary | ICD-10-CM | POA: Diagnosis not present

## 2018-02-09 DIAGNOSIS — Z7401 Bed confinement status: Secondary | ICD-10-CM | POA: Diagnosis not present

## 2018-02-09 DIAGNOSIS — E875 Hyperkalemia: Secondary | ICD-10-CM

## 2018-02-09 DIAGNOSIS — D72829 Elevated white blood cell count, unspecified: Secondary | ICD-10-CM | POA: Diagnosis present

## 2018-02-09 DIAGNOSIS — M1711 Unilateral primary osteoarthritis, right knee: Secondary | ICD-10-CM | POA: Diagnosis present

## 2018-02-09 DIAGNOSIS — R7989 Other specified abnormal findings of blood chemistry: Secondary | ICD-10-CM | POA: Diagnosis not present

## 2018-02-09 DIAGNOSIS — Z79899 Other long term (current) drug therapy: Secondary | ICD-10-CM

## 2018-02-09 DIAGNOSIS — Z9089 Acquired absence of other organs: Secondary | ICD-10-CM

## 2018-02-09 DIAGNOSIS — I4892 Unspecified atrial flutter: Secondary | ICD-10-CM | POA: Diagnosis not present

## 2018-02-09 DIAGNOSIS — R443 Hallucinations, unspecified: Secondary | ICD-10-CM | POA: Diagnosis present

## 2018-02-09 DIAGNOSIS — I340 Nonrheumatic mitral (valve) insufficiency: Secondary | ICD-10-CM | POA: Diagnosis not present

## 2018-02-09 DIAGNOSIS — I679 Cerebrovascular disease, unspecified: Secondary | ICD-10-CM | POA: Diagnosis not present

## 2018-02-09 DIAGNOSIS — R4182 Altered mental status, unspecified: Secondary | ICD-10-CM | POA: Diagnosis not present

## 2018-02-09 DIAGNOSIS — R0602 Shortness of breath: Secondary | ICD-10-CM | POA: Diagnosis not present

## 2018-02-09 DIAGNOSIS — E872 Acidosis: Secondary | ICD-10-CM | POA: Diagnosis present

## 2018-02-09 DIAGNOSIS — N179 Acute kidney failure, unspecified: Secondary | ICD-10-CM | POA: Diagnosis present

## 2018-02-09 DIAGNOSIS — I519 Heart disease, unspecified: Secondary | ICD-10-CM | POA: Diagnosis not present

## 2018-02-09 DIAGNOSIS — I429 Cardiomyopathy, unspecified: Secondary | ICD-10-CM | POA: Diagnosis present

## 2018-02-09 DIAGNOSIS — D751 Secondary polycythemia: Secondary | ICD-10-CM | POA: Diagnosis present

## 2018-02-09 DIAGNOSIS — R1110 Vomiting, unspecified: Secondary | ICD-10-CM | POA: Diagnosis not present

## 2018-02-09 DIAGNOSIS — I872 Venous insufficiency (chronic) (peripheral): Secondary | ICD-10-CM | POA: Diagnosis present

## 2018-02-09 DIAGNOSIS — I370 Nonrheumatic pulmonary valve stenosis: Secondary | ICD-10-CM | POA: Diagnosis not present

## 2018-02-09 DIAGNOSIS — I4819 Other persistent atrial fibrillation: Secondary | ICD-10-CM | POA: Diagnosis not present

## 2018-02-09 DIAGNOSIS — D729 Disorder of white blood cells, unspecified: Secondary | ICD-10-CM | POA: Diagnosis not present

## 2018-02-09 DIAGNOSIS — I4891 Unspecified atrial fibrillation: Secondary | ICD-10-CM

## 2018-02-09 DIAGNOSIS — G9341 Metabolic encephalopathy: Secondary | ICD-10-CM | POA: Diagnosis not present

## 2018-02-09 DIAGNOSIS — Z853 Personal history of malignant neoplasm of breast: Secondary | ICD-10-CM | POA: Diagnosis not present

## 2018-02-09 DIAGNOSIS — Z6831 Body mass index (BMI) 31.0-31.9, adult: Secondary | ICD-10-CM

## 2018-02-09 DIAGNOSIS — I130 Hypertensive heart and chronic kidney disease with heart failure and stage 1 through stage 4 chronic kidney disease, or unspecified chronic kidney disease: Secondary | ICD-10-CM | POA: Diagnosis present

## 2018-02-09 DIAGNOSIS — I959 Hypotension, unspecified: Secondary | ICD-10-CM | POA: Diagnosis not present

## 2018-02-09 DIAGNOSIS — M2550 Pain in unspecified joint: Secondary | ICD-10-CM | POA: Diagnosis not present

## 2018-02-09 DIAGNOSIS — E669 Obesity, unspecified: Secondary | ICD-10-CM | POA: Diagnosis present

## 2018-02-09 DIAGNOSIS — R0600 Dyspnea, unspecified: Secondary | ICD-10-CM

## 2018-02-09 DIAGNOSIS — G9389 Other specified disorders of brain: Secondary | ICD-10-CM | POA: Diagnosis present

## 2018-02-09 DIAGNOSIS — G9340 Encephalopathy, unspecified: Secondary | ICD-10-CM

## 2018-02-09 DIAGNOSIS — I272 Pulmonary hypertension, unspecified: Secondary | ICD-10-CM | POA: Diagnosis present

## 2018-02-09 DIAGNOSIS — N184 Chronic kidney disease, stage 4 (severe): Secondary | ICD-10-CM | POA: Diagnosis present

## 2018-02-09 DIAGNOSIS — N183 Chronic kidney disease, stage 3 unspecified: Secondary | ICD-10-CM

## 2018-02-09 DIAGNOSIS — N189 Chronic kidney disease, unspecified: Secondary | ICD-10-CM | POA: Diagnosis not present

## 2018-02-09 DIAGNOSIS — I739 Peripheral vascular disease, unspecified: Secondary | ICD-10-CM | POA: Diagnosis not present

## 2018-02-09 DIAGNOSIS — Z7952 Long term (current) use of systemic steroids: Secondary | ICD-10-CM

## 2018-02-09 DIAGNOSIS — E876 Hypokalemia: Secondary | ICD-10-CM | POA: Diagnosis present

## 2018-02-09 DIAGNOSIS — M1A9XX0 Chronic gout, unspecified, without tophus (tophi): Secondary | ICD-10-CM | POA: Diagnosis present

## 2018-02-09 DIAGNOSIS — K759 Inflammatory liver disease, unspecified: Secondary | ICD-10-CM | POA: Diagnosis present

## 2018-02-09 DIAGNOSIS — R778 Other specified abnormalities of plasma proteins: Secondary | ICD-10-CM

## 2018-02-09 DIAGNOSIS — I483 Typical atrial flutter: Secondary | ICD-10-CM | POA: Diagnosis not present

## 2018-02-09 DIAGNOSIS — Z96651 Presence of right artificial knee joint: Secondary | ICD-10-CM | POA: Diagnosis present

## 2018-02-09 DIAGNOSIS — R001 Bradycardia, unspecified: Secondary | ICD-10-CM | POA: Diagnosis not present

## 2018-02-09 DIAGNOSIS — Z8249 Family history of ischemic heart disease and other diseases of the circulatory system: Secondary | ICD-10-CM

## 2018-02-09 DIAGNOSIS — I998 Other disorder of circulatory system: Secondary | ICD-10-CM | POA: Diagnosis not present

## 2018-02-09 DIAGNOSIS — I10 Essential (primary) hypertension: Secondary | ICD-10-CM | POA: Diagnosis not present

## 2018-02-09 DIAGNOSIS — M109 Gout, unspecified: Secondary | ICD-10-CM | POA: Diagnosis not present

## 2018-02-09 DIAGNOSIS — Z7189 Other specified counseling: Secondary | ICD-10-CM | POA: Diagnosis not present

## 2018-02-09 DIAGNOSIS — Z9181 History of falling: Secondary | ICD-10-CM

## 2018-02-09 DIAGNOSIS — R404 Transient alteration of awareness: Secondary | ICD-10-CM | POA: Diagnosis not present

## 2018-02-09 DIAGNOSIS — I361 Nonrheumatic tricuspid (valve) insufficiency: Secondary | ICD-10-CM | POA: Diagnosis not present

## 2018-02-09 DIAGNOSIS — T501X5A Adverse effect of loop [high-ceiling] diuretics, initial encounter: Secondary | ICD-10-CM | POA: Diagnosis present

## 2018-02-09 DIAGNOSIS — I5043 Acute on chronic combined systolic (congestive) and diastolic (congestive) heart failure: Secondary | ICD-10-CM | POA: Diagnosis present

## 2018-02-09 DIAGNOSIS — K210 Gastro-esophageal reflux disease with esophagitis: Secondary | ICD-10-CM | POA: Diagnosis present

## 2018-02-09 DIAGNOSIS — Z8673 Personal history of transient ischemic attack (TIA), and cerebral infarction without residual deficits: Secondary | ICD-10-CM

## 2018-02-09 DIAGNOSIS — R410 Disorientation, unspecified: Secondary | ICD-10-CM | POA: Diagnosis not present

## 2018-02-09 DIAGNOSIS — I509 Heart failure, unspecified: Secondary | ICD-10-CM | POA: Diagnosis not present

## 2018-02-09 DIAGNOSIS — N170 Acute kidney failure with tubular necrosis: Secondary | ICD-10-CM | POA: Diagnosis not present

## 2018-02-09 HISTORY — DX: Obesity, unspecified: E66.9

## 2018-02-09 HISTORY — DX: Malignant neoplasm of unspecified site of unspecified female breast: C50.919

## 2018-02-09 HISTORY — DX: Cerebral infarction, unspecified: I63.9

## 2018-02-09 HISTORY — DX: Venous insufficiency (chronic) (peripheral): I87.2

## 2018-02-09 HISTORY — DX: Chronic kidney disease, stage 4 (severe): N18.4

## 2018-02-09 HISTORY — DX: Gout, unspecified: M10.9

## 2018-02-09 LAB — I-STAT CG4 LACTIC ACID, ED: Lactic Acid, Venous: 1.8 mmol/L (ref 0.5–1.9)

## 2018-02-09 LAB — COMPREHENSIVE METABOLIC PANEL
ALBUMIN: 4.1 g/dL (ref 3.5–5.0)
ALK PHOS: 80 U/L (ref 38–126)
ALT: 56 U/L — AB (ref 0–44)
AST: 41 U/L (ref 15–41)
Anion gap: 13 (ref 5–15)
BILIRUBIN TOTAL: 1.1 mg/dL (ref 0.3–1.2)
BUN: 54 mg/dL — AB (ref 8–23)
CALCIUM: 10.3 mg/dL (ref 8.9–10.3)
CO2: 19 mmol/L — ABNORMAL LOW (ref 22–32)
CREATININE: 2.02 mg/dL — AB (ref 0.44–1.00)
Chloride: 108 mmol/L (ref 98–111)
GFR calc Af Amer: 25 mL/min — ABNORMAL LOW (ref >60)
GFR calc non Af Amer: 21 mL/min — ABNORMAL LOW (ref >60)
GLUCOSE: 147 mg/dL — AB (ref 70–99)
Potassium: 4.3 mmol/L (ref 3.5–5.1)
SODIUM: 140 mmol/L (ref 135–145)
TOTAL PROTEIN: 6.8 g/dL (ref 6.5–8.1)

## 2018-02-09 LAB — CG4 I-STAT (LACTIC ACID): Lactic Acid, Venous: 2.46 mmol/L (ref 0.5–1.9)

## 2018-02-09 LAB — POCT I-STAT, CHEM 8
BUN: 46 mg/dL — ABNORMAL HIGH (ref 8–23)
Calcium, Ion: 1.33 mmol/L (ref 1.15–1.40)
Chloride: 111 mmol/L (ref 98–111)
Creatinine, Ser: 1.9 mg/dL — ABNORMAL HIGH (ref 0.44–1.00)
Glucose, Bld: 148 mg/dL — ABNORMAL HIGH (ref 70–99)
HCT: 43 % (ref 36.0–46.0)
Hemoglobin: 14.6 g/dL (ref 12.0–15.0)
Potassium: 4.3 mmol/L (ref 3.5–5.1)
SODIUM: 138 mmol/L (ref 135–145)
TCO2: 18 mmol/L — ABNORMAL LOW (ref 22–32)

## 2018-02-09 LAB — CBC
HCT: 44.4 % (ref 36.0–46.0)
Hemoglobin: 14 g/dL (ref 12.0–15.0)
MCH: 31.8 pg (ref 26.0–34.0)
MCHC: 31.5 g/dL (ref 30.0–36.0)
MCV: 100.9 fL — AB (ref 80.0–100.0)
Platelets: 279 10*3/uL (ref 150–400)
RBC: 4.4 MIL/uL (ref 3.87–5.11)
RDW: 14.7 % (ref 11.5–15.5)
WBC: 12.9 10*3/uL — ABNORMAL HIGH (ref 4.0–10.5)
nRBC: 0.2 % (ref 0.0–0.2)

## 2018-02-09 LAB — URINALYSIS, ROUTINE W REFLEX MICROSCOPIC
Bilirubin Urine: NEGATIVE
Glucose, UA: NEGATIVE mg/dL
Hgb urine dipstick: NEGATIVE
Ketones, ur: 5 mg/dL — AB
Leukocytes, UA: NEGATIVE
Nitrite: NEGATIVE
Protein, ur: NEGATIVE mg/dL
Specific Gravity, Urine: 1.019 (ref 1.005–1.030)
pH: 5 (ref 5.0–8.0)

## 2018-02-09 LAB — MAGNESIUM: Magnesium: 2.4 mg/dL (ref 1.7–2.4)

## 2018-02-09 LAB — TROPONIN I: TROPONIN I: 0.79 ng/mL — AB (ref <0.03)

## 2018-02-09 LAB — TSH: TSH: 4.678 u[IU]/mL — ABNORMAL HIGH (ref 0.350–4.500)

## 2018-02-09 LAB — GLUCOSE, CAPILLARY: Glucose-Capillary: 138 mg/dL — ABNORMAL HIGH (ref 70–99)

## 2018-02-09 MED ORDER — ONDANSETRON HCL 4 MG/2ML IJ SOLN: 4.0000 mg | Freq: Four times a day (QID) | INTRAMUSCULAR | Status: DC | PRN

## 2018-02-09 MED ORDER — ACETAMINOPHEN 650 MG RE SUPP: 650.0000 mg | Freq: Four times a day (QID) | RECTAL | Status: DC | PRN

## 2018-02-09 MED ORDER — SODIUM CHLORIDE 0.9 % IV BOLUS
1000.0000 mL | Freq: Once | INTRAVENOUS | Status: AC
  Administered 2018-02-09: 1000 mL via INTRAVENOUS

## 2018-02-09 MED ORDER — ASPIRIN 325 MG PO TABS
325.0000 mg | ORAL_TABLET | Freq: Every day | ORAL | Status: DC
  Administered 2018-02-10: 325 mg via ORAL
  Filled 2018-02-09 (×2): qty 1

## 2018-02-09 MED ORDER — ACETAMINOPHEN 325 MG PO TABS: 650.0000 mg | ORAL_TABLET | Freq: Four times a day (QID) | ORAL | Status: DC | PRN

## 2018-02-09 MED ORDER — DILTIAZEM HCL-DEXTROSE 100-5 MG/100ML-% IV SOLN (PREMIX)
5.0000 mg/h | INTRAVENOUS | Status: DC
  Administered 2018-02-09 – 2018-02-10 (×2): 5 mg/h via INTRAVENOUS
  Filled 2018-02-09 (×3): qty 100

## 2018-02-09 MED ORDER — ONDANSETRON HCL 4 MG PO TABS: 4.0000 mg | ORAL_TABLET | Freq: Four times a day (QID) | ORAL | Status: DC | PRN

## 2018-02-09 MED ORDER — METOPROLOL TARTRATE 25 MG PO TABS
25.0000 mg | ORAL_TABLET | Freq: Two times a day (BID) | ORAL | Status: DC
  Administered 2018-02-10 (×2): 25 mg via ORAL
  Filled 2018-02-09 (×3): qty 1

## 2018-02-09 MED ORDER — ALLOPURINOL 100 MG PO TABS
100.0000 mg | ORAL_TABLET | Freq: Every day | ORAL | Status: DC
  Administered 2018-02-10 – 2018-02-24 (×15): 100 mg via ORAL
  Filled 2018-02-09 (×15): qty 1

## 2018-02-09 MED ORDER — DILTIAZEM LOAD VIA INFUSION
10.0000 mg | Freq: Once | INTRAVENOUS | Status: AC
  Administered 2018-02-09: 10 mg via INTRAVENOUS
  Filled 2018-02-09: qty 10

## 2018-02-09 MED ORDER — METOPROLOL TARTRATE 25 MG PO TABS
25.0000 mg | ORAL_TABLET | Freq: Once | ORAL | Status: AC
  Administered 2018-02-09: 25 mg via ORAL
  Filled 2018-02-09: qty 1

## 2018-02-09 NOTE — ED Provider Notes (Signed)
[facility] DEPT Provider Note   CSN: [id number] Arrival date & time: 02/09/18  1553     History   Chief Complaint Chief Complaint  Patient presents with  . Hallucinations  . Altered Mental Status  . Shortness of Breath    HPI Kendra Bray is a 85 y.o. female.  HPI    85 year old female with past medical history as below here with altered mental status.  The history is provided by patient's daughter primarily as well as the patient, though limited due to patient's confusion.  Per report, patient has been getting regular steroid injections for her back.  She has been otherwise well.  She is normally awake, alert, and lives independently.  Over the last several days, the patient has been increasingly confused.  Daughter has been calling her on the phone and she has been hallucinating, thinking that other people were in her house, including her daughter.  She admits that she was confused, and felt like she was seeing and hearing "crazy things."  She has had associated generalized fatigue, mild cough, and shortness of breath.  No urinary symptoms.  She has no history of similar symptoms.  Denies any overt chest pain.  No recent medication changes other than the steroid injections for her back.  No other complaints.   Past Medical History:  Diagnosis Date  . Arthritis   . Cancer [facility])    breast - left  . Chicken pox   . Chronic kidney disease   . GERD (gastroesophageal reflux disease)   . Hepatitis    jaundice  . Hypertension   . Menopause     Patient Active Problem List   Diagnosis Date Noted  . Atrial fibrillation with RVR ([place]) 02/09/2018  . Anemia 02/26/2012  . OSTEOARTHRITIS, KNEE, RIGHT 03/13/2009  . VITAMIN D DEFICIENCY 01/25/2009  . ALLERGIC RHINITIS 07/20/2008  . RENAL FAILURE 02/15/2008  . ARTHRITIS, HX OF 09/15/2006  . OSTEOPOROSIS 09/09/2006  . HYPERTENSION 09/07/2006  . GERD 09/07/2006  . BREAST CANCER, HX OF 09/07/2006     Past Surgical History:  Procedure Laterality Date  . APPENDECTOMY    . BREAST SURGERY     cancer  . CHOLECYSTECTOMY    . TONSILLECTOMY AND ADENOIDECTOMY    . TOTAL KNEE ARTHROPLASTY     right     OB History   No obstetric history on file.      Home Medications    Prior to Admission medications   Medication Sig Start Date End Date Taking? Authorizing Provider  allopurinol (ZYLOPRIM) 100 MG tablet Take 10,200 mg by mouth daily.  04/19/15  Yes [provider]  aspirin 325 MG tablet Take 325 mg by mouth daily.   Yes [provider]  cholecalciferol (VITAMIN D) 1000 UNITS tablet Take 2,000 Units by mouth daily.    Yes [provider]  cyanocobalamin (,VITAMIN B-12,) 1000 MCG/ML injection Inject 1,000 mcg into the muscle every 30 (thirty) days. 11/16/17  Yes [provider]  famotidine (PEPCID) 10 MG tablet Take 10 mg by mouth daily as needed for heartburn or indigestion.   Yes [provider]  NON FORMULARY Stool softner   Yes [provider]    Family History Family History  Problem Relation Age of Onset  . Cancer Other   . Coronary artery disease Other     Social History Social History   Tobacco Use  . Smoking status: Never Smoker  . Smokeless tobacco: Never Used  Substance Use Topics  . Alcohol use: No  . Drug use: No     Allergies   Prednisone   Review of Systems Review of Systems  Constitutional: Positive for fatigue. Negative for chills and fever.  HENT: Negative for congestion and rhinorrhea.   Eyes: Negative for visual disturbance.  Respiratory: Positive for cough and shortness of breath. Negative for wheezing.   Cardiovascular: Negative for chest pain and leg swelling.  Gastrointestinal: Negative for abdominal pain, diarrhea, nausea and vomiting.  Genitourinary: Negative for dysuria and flank pain.  Musculoskeletal: Negative for neck pain and neck stiffness.  Skin: Negative for rash and wound.   Allergic/Immunologic: Negative for immunocompromised state.  Neurological: Positive for weakness. Negative for syncope and headaches.  Psychiatric/Behavioral: Positive for confusion.  All other systems reviewed and are negative.    Physical Exam Updated Vital Signs BP 110/70   Pulse (!) 37   Temp 99.1 F (37.3 C) (Rectal)   Resp (!) 29   SpO2 97%   Physical Exam Vitals signs and nursing note reviewed.  Constitutional:      General: She is not in acute distress.    Appearance: She is well-developed. She is ill-appearing.  HENT:     Head: Normocephalic and atraumatic.  Eyes:     Conjunctiva/sclera: Conjunctivae normal.  Neck:     Musculoskeletal: Neck supple.  Cardiovascular:     Rate and Rhythm: Tachycardia present. Rhythm regularly irregular.     Heart sounds: Normal heart sounds. No murmur. No friction rub.  Pulmonary:     Effort: Pulmonary effort is normal. No respiratory distress.     Breath sounds: Normal breath sounds. No wheezing or rales.  Abdominal:     General: There is no distension.     Palpations: Abdomen is soft.     Tenderness: There is no abdominal tenderness.  Skin:    General: Skin is warm.     Capillary Refill: Capillary refill takes less than 2 seconds.  Neurological:     Mental Status: She is alert and oriented to person, place, and time.     GCS: GCS eye subscore is 4. GCS verbal subscore is 5. GCS motor subscore is 6.     Sensory: Sensation is intact.     Motor: Motor function is intact. No abnormal muscle tone.     Coordination: Coordination is intact.     Comments: Memory slightly impaired, exam o/w unremarkable.      ED Treatments / Results  Labs (all labs ordered are listed, but only abnormal results are displayed) Labs Reviewed  COMPREHENSIVE METABOLIC PANEL - Abnormal; Notable for the following components:      Result Value   CO2 19 (*)    Glucose, Bld 147 (*)    BUN 54 (*)    Creatinine, Ser 2.02 (*)    ALT 56 (*)    GFR  calc non Af Amer 21 (*)    GFR calc Af Amer 25 (*)    All other components within normal limits  CBC - Abnormal; Notable for the following components:   WBC 12.9 (*)    MCV 100.9 (*)    All other components within normal limits  TROPONIN I - Abnormal; Notable for the following components:   Troponin I 0.79 (*)    All other components within normal limits  TSH - Abnormal; Notable for the following components:   TSH 4.678 (*)    All other components within normal limits  URINALYSIS, ROUTINE W  REFLEX MICROSCOPIC - Abnormal; Notable for the following components:   Ketones, ur 5 (*)    All other components within normal limits  GLUCOSE, CAPILLARY - Abnormal; Notable for the following components:   Glucose-Capillary 138 (*)    All other components within normal limits  POCT I-STAT, CHEM 8 - Abnormal; Notable for the following components:   BUN 46 (*)    Creatinine, Ser 1.90 (*)    Glucose, Bld 148 (*)    TCO2 18 (*)    All other components within normal limits  CG4 I-STAT (LACTIC ACID) - Abnormal; Notable for the following components:   Lactic Acid, Venous 2.46 (*)    All other components within normal limits  CULTURE, BLOOD (ROUTINE X 2)  CULTURE, BLOOD (ROUTINE X 2)  MAGNESIUM  CBG MONITORING, ED  I-STAT CHEM 8, ED  I-STAT CG4 LACTIC ACID, ED  I-STAT CG4 LACTIC ACID, ED    EKG None  Radiology Ct Head Wo Contrast  Result Date: 02/09/2018 CLINICAL DATA:  85 year old female with unexplained altered mental status. Visual hallucinations, confusion and shortness of breath for 2 days. EXAM: CT HEAD WITHOUT CONTRAST TECHNIQUE: Contiguous axial images were obtained from the base of the skull through the vertex without intravenous contrast. COMPARISON:  None. FINDINGS: Brain: Chronic encephalomalacia in the right inferior frontal gyrus with regional white matter gliosis. Patchy and confluent cerebral white matter hypodensity elsewhere. No midline shift, ventriculomegaly, mass effect,  evidence of mass lesion, intracranial hemorrhage or evidence of cortically based acute infarction. No other cortical encephalomalacia. Vascular: Calcified atherosclerosis at the skull base. No suspicious intracranial vascular hyperdensity. Skull: Negative. Sinuses/Orbits: Visualized paranasal sinuses and mastoids are well pneumatized. Other: Negative visible orbits aside from postoperative changes. Trace intravenous gas about the right temporalis muscle, probably related to IV access. Other visualized scalp soft tissues are within normal limits. IMPRESSION: 1. Chronic right inferior frontal encephalomalacia is probably due to a prior anterior division right MCA infarct. 2. Moderate superimposed cerebral white matter changes, most commonly due to chronic small vessel disease. 3. No acute intracranial abnormality. Electronically Signed   By: Genevie Ann M.D.   On: 02/09/2018 18:25   Dg Chest Portable 1 View  Result Date: 02/09/2018 CLINICAL DATA:  Chest pain and shortness of breath for 2 days EXAM: PORTABLE CHEST 1 VIEW COMPARISON:  04/08/2015 FINDINGS: Cardiac shadow is enlarged but stable. The lungs are well aerated bilaterally. No focal infiltrate or sizable effusion is seen. Degenerative changes of the thoracic spine are noted. IMPRESSION: No acute abnormality noted. Electronically Signed   By: Inez Catalina M.D.   On: 02/09/2018 17:00    Procedures .Critical Care Performed by: Duffy Bruce, MD Authorized by: Duffy Bruce, MD   Critical care provider statement:    Critical care time (minutes):  45   Critical care time was exclusive of:  Separately billable procedures and treating other patients and teaching time   Critical care was necessary to treat or prevent imminent or life-threatening deterioration of the following conditions:  Cardiac failure, circulatory failure and respiratory failure   Critical care was time spent personally by me on the following activities:  Development of treatment  plan with patient or surrogate, discussions with consultants, evaluation of patient's response to treatment, examination of patient, obtaining history from patient or surrogate, ordering and performing treatments and interventions, ordering and review of laboratory studies, ordering and review of radiographic studies, pulse oximetry, re-evaluation of patient's condition and review of old charts   I assumed  direction of critical care for this patient from another provider in my specialty: no     (including critical care time)  Medications Ordered in ED Medications  diltiazem (CARDIZEM) 1 mg/mL load via infusion 10 mg (10 mg Intravenous Bolus from Bag 02/09/18 1644)    And  diltiazem (CARDIZEM) 100 mg in dextrose 5% 166mL (1 mg/mL) infusion (15 mg/hr Intravenous Restarted 02/09/18 2200)  sodium chloride 0.9 % bolus 1,000 mL (0 mLs Intravenous Stopped 02/09/18 1800)  sodium chloride 0.9 % bolus 1,000 mL (1,000 mLs Intravenous New Bag/Given 02/09/18 1858)  metoprolol tartrate (LOPRESSOR) tablet 25 mg (25 mg Oral Given 02/09/18 2109)     Initial Impression / Assessment and Plan / ED Course  I have reviewed the triage vital signs and the nursing notes.  Pertinent labs & imaging results that were available during my care of the patient were reviewed by me and considered in my medical decision making (see chart for details).     85 year old female here with altered mental status.  Clinically, she appears significantly better than her vital signs, with heart rate in the 180s on arrival.  There was no response to vagal maneuvers and she does have moderate variability consistent with likely new onset atrial fibrillation.  Given her history of steroid injections and now hallucinations, I suspect she may have steroid-induced psychosis with component of subsequent A. fib RVR.  Patient given diltiazem bolus and drip with improvement in her heart rate and blood pressure.  Unknown onset of A. fib as she is  asymptomatic from the A. fib perspective.  CT head negative for mass or acute stroke.  Chest x-ray clear.  UA is pending which she has no urinary symptoms, though encephalopathy from UTI is also a consideration.  She has a positive troponin which is suspect is demand and EKG is without ST segment changes.  No lower extremity edema, hypoxia, or evidence to suggest PE.  CHA2Ds2-VASc Score for Atrial Fibrillation    Patient Score  Age <65 = 0 65-74 = 1 > 75 = 2 2  Sex Female = 0 Female = 1 1  CHF History No = 0  Yes = 1 0  HTN History No = 0  Yes = 1 1  Stroke/TIA/TE History No = 0  Yes = 1 1  Vascular Disease History No = 0  Yes = 1 0  Diabetes History No = 0  Yes = 1 0  Total:  5   7.2 % stroke rate/year from a score of 5   Final Clinical Impressions(s) / ED Diagnoses   Final diagnoses:  Atrial fibrillation with rapid ventricular response (HCC)  Elevated troponin  Encephalopathy    ED Discharge Orders    None       Duffy Bruce, MD 02/09/18 2219

## 2018-02-09 NOTE — ED Notes (Signed)
Give metoprolol 25 mg po x1 dose and hold diltaziem drip for MRI then restart immediately after MRI.

## 2018-02-09 NOTE — ED Notes (Signed)
Date and time results received: 02/09/18 18:17  Test: Troponin  Critical Value: 0.79  Name of Provider Notified: Dr. Crissie Reese   Orders Received? Or Actions Taken?: Continue to monitor and await for new orders

## 2018-02-09 NOTE — ED Notes (Signed)
Patient transported to MRI. Cardizem gtt on hold HR 70's to low 80's

## 2018-02-09 NOTE — H&P (Addendum)
History and Physical    Kendra Bray [facility] LOV:[id number] DOB: [birth year] DOA: 02/09/2018  PCP: Dorena Cookey, MD  Patient coming from: Home.  Chief Complaint: Confusion and hallucination.  HPI: Kendra Bray is a 85 y.o. female with history of chronic kidney disease, breast cancer in remission, gout was brought to the ER after patient was found to be confused.  As per the family who discussed with the ER physician patient has not been able to be reached for the last 4 days and fashion the daughter had arrived from [place] to check on the patient.  Patient was found to be confused and was brought to the ER.  As per the family patient also was having some hallucinations.  Patient did not complain of any headache chest pain or shortness of breath nausea vomiting or diarrhea.  ED Course: In the ER patient was found to be in A. fib with RVR.  Appeared nonfocal.  Times of confusion.  CT head was unremarkable MRI of the brain was done which is showing nothing acute.  UA is showing nothing acute.  Chest x-ray does not show any infection.  Patient does have leukocytosis.  Patient admitted for further observation.  Review of Systems: As per HPI, rest all negative.   Past Medical History:  Diagnosis Date  . Arthritis   . Cancer [facility])    breast - left  . Chicken pox   . Chronic kidney disease   . GERD (gastroesophageal reflux disease)   . Hepatitis    jaundice  . Hypertension   . Menopause     Past Surgical History:  Procedure Laterality Date  . APPENDECTOMY    . BREAST SURGERY     cancer  . CHOLECYSTECTOMY    . TONSILLECTOMY AND ADENOIDECTOMY    . TOTAL KNEE ARTHROPLASTY     right     reports that she has never smoked. She has never used smokeless tobacco. She reports that she does not drink alcohol or use drugs.  Allergies  Allergen Reactions  . Prednisone Other (See Comments)    hallucinations    Family History  Problem Relation Age of Onset  . Cancer Other   .  Coronary artery disease Other     Prior to Admission medications   Medication Sig Start Date End Date Taking? Authorizing Provider  allopurinol (ZYLOPRIM) 100 MG tablet Take 10,200 mg by mouth daily.  04/19/15  Yes [provider]  aspirin 325 MG tablet Take 325 mg by mouth daily.   Yes [provider]  cholecalciferol (VITAMIN D) 1000 UNITS tablet Take 2,000 Units by mouth daily.    Yes [provider]  cyanocobalamin (,VITAMIN B-12,) 1000 MCG/ML injection Inject 1,000 mcg into the muscle every 30 (thirty) days. 11/16/17  Yes [provider]  famotidine (PEPCID) 10 MG tablet Take 10 mg by mouth daily as needed for heartburn or indigestion.   Yes [provider]  NON FORMULARY Stool softner   Yes [provider]    Physical Exam: Vitals:   02/09/18 2108 02/09/18 2200 02/09/18 2215 02/09/18 2230  BP:  110/70 (!) 92/59 (!) 87/75  Pulse: (!) 37     Resp: (!) 27 (!) 29 (!) 30 20  Temp:      TempSrc:      SpO2: 97%         Constitutional: Moderately built and nourished. Vitals:   02/09/18 2108 02/09/18 2200 02/09/18 2215 02/09/18 2230  BP:  110/70 Marland Kitchen)  92/59 (!) 87/75  Pulse: (!) 37     Resp: (!) 27 (!) 29 (!) 30 20  Temp:      TempSrc:      SpO2: 97%      Eyes: Anicteric no pallor. ENMT: No discharge from the ears eyes nose or mouth. Neck: No mass felt.  No neck rigidity but no JVD appreciated. Respiratory: No rhonchi or crepitations. Cardiovascular: S1-S2 heard. Abdomen: Soft nontender bowel sounds present. Musculoskeletal: No edema.  No joint effusion. Skin: No rash. Neurologic: Alert awake oriented to time place and person.  Moves all extremities. Psychiatric: Appears normal per normal affect.   Labs on Admission: I have personally reviewed following labs and imaging studies  CBC: Recent Labs  Lab 02/09/18 1637 02/09/18 1642  WBC  --  12.9*  HGB 14.6 14.0  HCT 43.0 44.4  MCV  --  100.9*  PLT  --  616    Basic Metabolic Panel: Recent Labs  Lab 02/09/18 1633 02/09/18 1637 02/09/18 1642  NA  --  138 140  K  --  4.3 4.3  CL  --  111 108  CO2  --   --  19*  GLUCOSE  --  148* 147*  BUN  --  46* 54*  CREATININE  --  1.90* 2.02*  CALCIUM  --   --  10.3  MG 2.4  --   --    GFR: CrCl cannot be calculated (Unknown ideal weight.). Liver Function Tests: Recent Labs  Lab 02/09/18 1642  AST 41  ALT 56*  ALKPHOS 80  BILITOT 1.1  PROT 6.8  ALBUMIN 4.1   No results for input(s): LIPASE, AMYLASE in the last 168 hours. No results for input(s): AMMONIA in the last 168 hours. Coagulation Profile: No results for input(s): INR, PROTIME in the last 168 hours. Cardiac Enzymes: Recent Labs  Lab 02/09/18 1633  TROPONINI 0.79*   BNP (last 3 results) No results for input(s): PROBNP in the last 8760 hours. HbA1C: No results for input(s): HGBA1C in the last 72 hours. CBG: Recent Labs  Lab 02/09/18 1702  GLUCAP 138*   Lipid Profile: No results for input(s): CHOL, HDL, LDLCALC, TRIG, CHOLHDL, LDLDIRECT in the last 72 hours. Thyroid Function Tests: Recent Labs    02/09/18 1633  TSH 4.678*   Anemia Panel: No results for input(s): VITAMINB12, FOLATE, FERRITIN, TIBC, IRON, RETICCTPCT in the last 72 hours. Urine analysis:    Component Value Date/Time   COLORURINE YELLOW 02/09/2018 2003   APPEARANCEUR CLEAR 02/09/2018 2003   LABSPEC 1.019 02/09/2018 2003   PHURINE 5.0 02/09/2018 2003   [place] 02/09/2018 2003   HGBUR NEGATIVE 02/09/2018 2003   HGBUR negative 02/14/2010 1317   BILIRUBINUR NEGATIVE 02/09/2018 2003   BILIRUBINUR n 02/28/2013 1112   KETONESUR 5 (A) 02/09/2018 2003   PROTEINUR NEGATIVE 02/09/2018 2003   UROBILINOGEN 0.2 02/28/2013 1112   UROBILINOGEN 0.2 02/14/2010 1317   NITRITE NEGATIVE 02/09/2018 2003   LEUKOCYTESUR NEGATIVE 02/09/2018 2003   Sepsis Labs: @LABRCNTIP (procalcitonin:4,lacticidven:4) )No results found for this or any previous visit  (from the past 240 hour(s)).   Radiological Exams on Admission: Ct Head Wo Contrast  Result Date: 02/09/2018 CLINICAL DATA:  86 year old female with unexplained altered mental status. Visual hallucinations, confusion and shortness of breath for 2 days. EXAM: CT HEAD WITHOUT CONTRAST TECHNIQUE: Contiguous axial images were obtained from the base of the skull through the vertex without intravenous contrast. COMPARISON:  None. FINDINGS: Brain: Chronic encephalomalacia in the  right inferior frontal gyrus with regional white matter gliosis. Patchy and confluent cerebral white matter hypodensity elsewhere. No midline shift, ventriculomegaly, mass effect, evidence of mass lesion, intracranial hemorrhage or evidence of cortically based acute infarction. No other cortical encephalomalacia. Vascular: Calcified atherosclerosis at the skull base. No suspicious intracranial vascular hyperdensity. Skull: Negative. Sinuses/Orbits: Visualized paranasal sinuses and mastoids are well pneumatized. Other: Negative visible orbits aside from postoperative changes. Trace intravenous gas about the right temporalis muscle, probably related to IV access. Other visualized scalp soft tissues are within normal limits. IMPRESSION: 1. Chronic right inferior frontal encephalomalacia is probably due to a prior anterior division right MCA infarct. 2. Moderate superimposed cerebral white matter changes, most commonly due to chronic small vessel disease. 3. No acute intracranial abnormality. Electronically Signed   By: Genevie Ann M.D.   On: 02/09/2018 18:25   Mr Brain Wo Contrast  Result Date: 02/09/2018 CLINICAL DATA:  Altered mental status EXAM: MRI HEAD WITHOUT CONTRAST TECHNIQUE: Multiplanar, multiecho pulse sequences of the brain and surrounding structures were obtained without intravenous contrast. COMPARISON:  Head CT 02/09/2018 FINDINGS: BRAIN: There is no acute infarct, acute hemorrhage, hydrocephalus or extra-axial collection. The  midline structures are normal. No midline shift or other mass effect. Old right frontal lobe infarct encephalomalacia. Diffuse confluent hyperintense T2-weighted signal within the periventricular, deep and juxtacortical white matter, most commonly due to chronic ischemic microangiopathy. Generalized atrophy without lobar predilection. Susceptibility-sensitive sequences show no chronic microhemorrhage or superficial siderosis. VASCULAR: Major intracranial arterial and venous sinus flow voids are normal. SKULL AND UPPER CERVICAL SPINE: Calvarial bone marrow signal is normal. There is no skull base mass. Visualized upper cervical spine and soft tissues are normal. SINUSES/ORBITS: No fluid levels or advanced mucosal thickening. No mastoid or middle ear effusion. The orbits are normal. IMPRESSION: 1. No acute intracranial abnormality. 2. Chronic ischemic microangiopathy and generalized advanced atrophy. 3. Old right frontal lobe infarct associated encephalomalacia. Electronically Signed   By: Ulyses Jarred M.D.   On: 02/09/2018 22:25   Dg Chest Portable 1 View  Result Date: 02/09/2018 CLINICAL DATA:  Chest pain and shortness of breath for 2 days EXAM: PORTABLE CHEST 1 VIEW COMPARISON:  04/08/2015 FINDINGS: Cardiac shadow is enlarged but stable. The lungs are well aerated bilaterally. No focal infiltrate or sizable effusion is seen. Degenerative changes of the thoracic spine are noted. IMPRESSION: No acute abnormality noted. Electronically Signed   By: Inez Catalina M.D.   On: 02/09/2018 17:00    EKG: Independently reviewed.  A. fib with RVR.  Assessment/Plan Principal Problem:   Atrial fibrillation with RVR (HCC) Active Problems:   BREAST CANCER, HX OF   Acute encephalopathy   CKD (chronic kidney disease), stage III ([place])    1. A. fib with RVR new onset -patient's chads 2 vasc score is at least 2.  Patient on heparin.  Patient was started on Cardizem infusion.  I also ordered metoprolol.  Once patient's  heart rate is controlled will try to wean off Cardizem. 2. Acute encephalopathy - back to baseline at the time of my exam but has been have episodes of confusion in the ER. MRI Aaron Edelman negative. Patient is afebrile. Will monitor closely. 3. History of gout on allopurinol. 4. History of breast cancer in remission.   DVT prophylaxis: Heparin. Code Status: Full code. Family Communication: No family at the bedside. Disposition Plan: Home. Consults called: None. Admission status: Inpatient.   Rise Patience MD [facility] Pager [phone].  If 7PM-7AM, please  contact night-coverage [email or web address] Password [facility]  02/09/2018, 11:42 PM

## 2018-02-09 NOTE — ED Notes (Signed)
Bed: SH38 Expected date:  Expected time:  Means of arrival:  Comments: Held for stepdown/ICU

## 2018-02-09 NOTE — ED Notes (Signed)
Patient transported to and returned from CT.

## 2018-02-09 NOTE — ED Triage Notes (Signed)
Pt been having visual hallucinations, confusion and SOB since Sunday. Pt has cough and congestion.  Denies any urinary problems. Reports had diarrhea about 5 days ago.

## 2018-02-09 NOTE — ED Notes (Signed)
Patient transported from MRI to room Res B dilitazem gtt restarted at 15 mg / hr

## 2018-02-10 ENCOUNTER — Inpatient Hospital Stay (HOSPITAL_COMMUNITY): Payer: Medicare Other

## 2018-02-10 ENCOUNTER — Encounter (HOSPITAL_COMMUNITY): Payer: Self-pay | Admitting: Physician Assistant

## 2018-02-10 ENCOUNTER — Other Ambulatory Visit: Payer: Self-pay

## 2018-02-10 DIAGNOSIS — I370 Nonrheumatic pulmonary valve stenosis: Secondary | ICD-10-CM

## 2018-02-10 DIAGNOSIS — I4891 Unspecified atrial fibrillation: Secondary | ICD-10-CM

## 2018-02-10 DIAGNOSIS — I429 Cardiomyopathy, unspecified: Secondary | ICD-10-CM

## 2018-02-10 DIAGNOSIS — I361 Nonrheumatic tricuspid (valve) insufficiency: Secondary | ICD-10-CM

## 2018-02-10 DIAGNOSIS — N183 Chronic kidney disease, stage 3 (moderate): Secondary | ICD-10-CM

## 2018-02-10 DIAGNOSIS — I998 Other disorder of circulatory system: Secondary | ICD-10-CM

## 2018-02-10 DIAGNOSIS — G9340 Encephalopathy, unspecified: Secondary | ICD-10-CM

## 2018-02-10 DIAGNOSIS — I340 Nonrheumatic mitral (valve) insufficiency: Secondary | ICD-10-CM

## 2018-02-10 DIAGNOSIS — I739 Peripheral vascular disease, unspecified: Secondary | ICD-10-CM

## 2018-02-10 LAB — CBC
HEMATOCRIT: 40 % (ref 36.0–46.0)
Hemoglobin: 12.3 g/dL (ref 12.0–15.0)
MCH: 31.9 pg (ref 26.0–34.0)
MCHC: 30.8 g/dL (ref 30.0–36.0)
MCV: 103.6 fL — ABNORMAL HIGH (ref 80.0–100.0)
Platelets: 218 10*3/uL (ref 150–400)
RBC: 3.86 MIL/uL — ABNORMAL LOW (ref 3.87–5.11)
RDW: 14.9 % (ref 11.5–15.5)
WBC: 11.8 10*3/uL — ABNORMAL HIGH (ref 4.0–10.5)
nRBC: 0.3 % — ABNORMAL HIGH (ref 0.0–0.2)

## 2018-02-10 LAB — BASIC METABOLIC PANEL
Anion gap: 11 (ref 5–15)
BUN: 55 mg/dL — ABNORMAL HIGH (ref 8–23)
CO2: 18 mmol/L — ABNORMAL LOW (ref 22–32)
Calcium: 9.4 mg/dL (ref 8.9–10.3)
Chloride: 110 mmol/L (ref 98–111)
Creatinine, Ser: 1.77 mg/dL — ABNORMAL HIGH (ref 0.44–1.00)
GFR calc Af Amer: 29 mL/min — ABNORMAL LOW (ref >60)
GFR calc non Af Amer: 25 mL/min — ABNORMAL LOW (ref >60)
Glucose, Bld: 138 mg/dL — ABNORMAL HIGH (ref 70–99)
Potassium: 4.3 mmol/L (ref 3.5–5.1)
Sodium: 139 mmol/L (ref 135–145)

## 2018-02-10 LAB — AMMONIA: AMMONIA: 27 umol/L (ref 9–35)

## 2018-02-10 LAB — VITAMIN B12: Vitamin B-12: 2568 pg/mL — ABNORMAL HIGH (ref 180–914)

## 2018-02-10 LAB — HEPARIN LEVEL (UNFRACTIONATED)
Heparin Unfractionated: 2.16 IU/mL — ABNORMAL HIGH (ref 0.30–0.70)
Heparin Unfractionated: >2.2 IU/mL — ABNORMAL HIGH (ref 0.30–0.70)

## 2018-02-10 LAB — ECHOCARDIOGRAM COMPLETE
Height: 60 in
Weight: 2596.14 oz

## 2018-02-10 LAB — TROPONIN I
Troponin I: 0.59 ng/mL (ref <0.03)
Troponin I: 0.56 ng/mL (ref <0.03)

## 2018-02-10 LAB — T4, FREE: Free T4: 1.24 ng/dL (ref 0.82–1.77)

## 2018-02-10 MED ORDER — HEPARIN BOLUS VIA INFUSION
2000.0000 [IU] | Freq: Once | INTRAVENOUS | Status: AC
  Administered 2018-02-10: 2000 [IU] via INTRAVENOUS
  Filled 2018-02-10: qty 2000

## 2018-02-10 MED ORDER — LORAZEPAM 0.5 MG PO TABS
0.2500 mg | ORAL_TABLET | Freq: Once | ORAL | Status: AC
  Administered 2018-02-10: 0.25 mg via ORAL
  Filled 2018-02-10: qty 1

## 2018-02-10 MED ORDER — HEPARIN (PORCINE) 25000 UT/250ML-% IV SOLN
800.0000 [IU]/h | INTRAVENOUS | Status: DC
  Administered 2018-02-10: 900 [IU]/h via INTRAVENOUS
  Administered 2018-02-11 – 2018-02-12 (×2): 750 [IU]/h via INTRAVENOUS
  Administered 2018-02-13 – 2018-02-16 (×3): 800 [IU]/h via INTRAVENOUS
  Filled 2018-02-10 (×6): qty 250

## 2018-02-10 NOTE — Progress Notes (Signed)
Pharmacy - IV heparin  Assessment/Plan Please see note from Raelene Bott, PharmD earlier today for full details.  Briefly, 85 y.o. female on IV heparin for Afib.    Heparin level drawn at noon today was markedly elevated at 2.16 on 900 units with 2000 unit bolus given > 8 hrs ago. Some concern for draw from inappropriate vein so RN redrew lab from alternate site  Sample sent to lab but per staff volume was insufficient, so new order placed to draw lab  Called lab this evening, they stated they were unsuccessful x3 attempts at redraw.    Difficult situation as we would prefer not to use lovenox given CKD and needs for urgent procedures/surgeries. We have not been able to get an accurate heparin level. At this time I have decided to drop the heparin rate to 750/hr which is closer to our usual target of 12 units/kg/hr based on a heparin dosing weight of 62kg. No bleeding issues noted by nursing at [facility]. Hope we can get accurate labs in am.   Erin Hearing PharmD., BCPS Clinical Pharmacist 02/10/2018 6:03 PM

## 2018-02-10 NOTE — ED Notes (Signed)
20 gauge PIV inserted in RAC. Mittens applied.

## 2018-02-10 NOTE — Progress Notes (Signed)
  Echocardiogram 2D Echocardiogram has been performed.  Kendra Bray 02/10/2018, 1:26 PM

## 2018-02-10 NOTE — ED Notes (Signed)
Pt agitated and pulling on tubes while verbalizing that she wants to get out of the bed and go home. Pulled IV out. Mittens ordered.

## 2018-02-10 NOTE — Consult Note (Addendum)
Hospital Consult    Reason for Consult:  Ischemia RLE in setting of afib Requesting Physician:  Tyrell Antonio MRN #:  [id number]  History of Present Illness: This is a 85 y.o. female who presented to [facility] ED today with c/o pt being confused and having hallucinations.  Daughter states that she lives in [place] and just came down yesterday but her mother was saying that she had been seeing her earlier in the week and upset with her but she wasn't there.  She states that she was confused and hallucinating before with methylprednisolone injection in the past but was worse this time (last injection 01/28/2018).    While in the ED, the pt was found to be in afib and her RLE was cooler that the left.  She did not have any evidence of cyanosis or necrotic areas or discolorations.  Dr. Donnetta Hutching was consulted and pt was transferred to [facility] for further evaluation.   Pt states that she can feel her feet and does not report any numbness or pain.   Daughter states that when she goes out, she uses a cane to walk, but in the house gets around pretty good using the wall to steady herself.   She has a hx of CKD III, breast cancer that is in remission, arthritis, gout, chronic right inferior frontal encephalomalacia probably due to a prior anterior division MCA infarct found on head CT yesterday.   She is on a daily aspirin.  She takes allopurinol for gout.   Past Medical History:  Diagnosis Date  . Arthritis   . Breast cancer ([place])    breast - left  . Chicken pox   . CKD (chronic kidney disease), stage IV ([place])   . GERD (gastroesophageal reflux disease)   . Gout   . Hepatitis    jaundice  . Hypertension   . Menopause   . Obesity   . Stroke [facility])    a. occult stroke noted on brain imaging 01/2018.  Marland Kitchen Venous insufficiency     Past Surgical History:  Procedure Laterality Date  . APPENDECTOMY    . BREAST SURGERY     cancer  . CHOLECYSTECTOMY    . TONSILLECTOMY AND ADENOIDECTOMY    . TOTAL KNEE ARTHROPLASTY      right    Allergies  Allergen Reactions  . Prednisone Other (See Comments)    hallucinations    Prior to Admission medications   Medication Sig Start Date End Date Taking? Authorizing Provider  allopurinol (ZYLOPRIM) 100 MG tablet Take 10,200 mg by mouth daily.  04/19/15  Yes [provider]  aspirin 325 MG tablet Take 325 mg by mouth daily.   Yes [provider]  cholecalciferol (VITAMIN D) 1000 UNITS tablet Take 2,000 Units by mouth daily.    Yes [provider]  cyanocobalamin (,VITAMIN B-12,) 1000 MCG/ML injection Inject 1,000 mcg into the muscle every 30 (thirty) days. 11/16/17  Yes [provider]  famotidine (PEPCID) 10 MG tablet Take 10 mg by mouth daily as needed for heartburn or indigestion.   Yes [provider]  NON FORMULARY Stool softner   Yes [provider]    Social History   Socioeconomic History  . Marital status: Widowed    Spouse name: Not on file  . Number of children: Not on file  . Years of education: Not on file  . Highest education level: Not on file  Occupational History  . Not on file  Social Needs  . Financial  resource strain: Not on file  . Food insecurity:    Worry: Not on file    Inability: Not on file  . Transportation needs:    Medical: Not on file    Non-medical: Not on file  Tobacco Use  . Smoking status: Never Smoker  . Smokeless tobacco: Never Used  Substance and Sexual Activity  . Alcohol use: No  . Drug use: No  . Sexual activity: Not on file  Lifestyle  . Physical activity:    Days per week: Not on file    Minutes per session: Not on file  . Stress: Not on file  Relationships  . Social connections:    Talks on phone: Not on file    Gets together: Not on file    Attends religious service: Not on file    Active member of club or organization: Not on file    Attends meetings of clubs or organizations: Not on file    Relationship status: Not on file  . Intimate partner  violence:    Fear of current or ex partner: Not on file    Emotionally abused: Not on file    Physically abused: Not on file    Forced sexual activity: Not on file  Other Topics Concern  . Not on file  Social History Narrative  . Not on file     Family History  Problem Relation Age of Onset  . Cancer Other   . Coronary artery disease Other     ROS: [x]  Positive   [ ]  Negative   [ ]  All sytems reviewed and are negative  Cardiac: [x]  afib  Vascular: []  pain in legs while walking []  pain in legs at rest []  pain in legs at night []  non-healing ulcers []  hx of DVT []  swelling in legs  Pulmonary: []  productive cough []  asthma/wheezing []  home O2  Neurologic: []  weakness in []  arms []  legs []  numbness in []  arms []  legs []  hx of CVA []  mini stroke [] difficulty speaking or slurred speech []  temporary loss of vision in one eye []  dizziness  Hematologic: [x]  hx of cancer-breast  Endocrine:   []  diabetes []  thyroid disease  GI [x]  GERD   GU: [x]  CKD/renal failure []  HD--[]  M/W/F or []  T/T/S  Psychiatric: [x]  hallucinations/confusion  Musculoskeletal: [x]  arthritis [x]  gout   Integumentary: []  rashes []  ulcers  Constitutional: []  fever []  chills   Physical Examination  Vitals:   02/10/18 1330 02/10/18 1504  BP: 111/68 108/67  Pulse: 64   Resp: (!) 28   Temp:    SpO2: 100%    Body mass index is 31.35 kg/m.  General:  WDWN in NAD Gait: Not observed HENT: WNL, normocephalic Pulmonary: normal non-labored breathing Cardiac: irregular Abdomen:  soft, NT/ND, no masses Skin: without rashes Vascular Exam/Pulses:  Right Left  Femoral 2+ (normal) 2+ (normal)  Popliteal 2+ (normal) Unable to palpate   AT Monophasic doppler signal Monophasic doppler signal  PT Biphasic doppler signal Biphasic doppler signal  Peroneal Faint doppler signal Faint doppler signal   Extremities: without ischemic changes, without Gangrene , without cellulitis;  without open wounds; right foot is slightly cooler than the left;  Motor and sensory are in tact.  There is no tenderness to palpation right calf.  Musculoskeletal: no muscle wasting or atrophy  Neurologic: A&O X 3;  No focal weakness or paresthesias are detected; speech is fluent/normal Psychiatric:  The pt has Normal affect.   CBC  Component Value Date/Time   WBC 11.8 (H) 02/10/2018 0830   RBC 3.86 (L) 02/10/2018 0830   HGB 12.3 02/10/2018 0830   HCT 40.0 02/10/2018 0830   PLT 218 02/10/2018 0830   MCV 103.6 (H) 02/10/2018 0830   MCH 31.9 02/10/2018 0830   MCHC 30.8 02/10/2018 0830   RDW 14.9 02/10/2018 0830   LYMPHSABS 1.0 02/28/2013 1009   MONOABS 0.4 02/28/2013 1009   EOSABS 0.1 02/28/2013 1009   BASOSABS 0.0 02/28/2013 1009    BMET    Component Value Date/Time   NA 139 02/10/2018 0917   K 4.3 02/10/2018 0917   CL 110 02/10/2018 0917   CO2 18 (L) 02/10/2018 0917   GLUCOSE 138 (H) 02/10/2018 0917   BUN 55 (H) 02/10/2018 0917   CREATININE 1.77 (H) 02/10/2018 0917   CALCIUM 9.4 02/10/2018 0917   GFRNONAA 25 (L) 02/10/2018 0917   GFRAA 29 (L) 02/10/2018 0917    COAGS: Lab Results  Component Value Date   INR 1.08 05/02/2009     Non-Invasive Vascular Imaging:   ABI's and RLE arterial duplex ordered stat and pending  Statin:  No. Beta Blocker:  No. Aspirin:  Yes.   ACEI:  No. ARB:  No. CCB use:  No Other antiplatelets/anticoagulants:  Yes.   Heparin gtt ordered   ASSESSMENT/PLAN: This is a 85 y.o. female admitted with hallucinations and confusion found to be in afib with possible ischemia RLE and VVS is consulted for evaluation.   -pt does have doppler signals present bilateral feet.  She has a palpable right popliteal pulse.   Her right foot is somewhat cooler than the left.  Her motor and sensation are in tact.  -ABI's and RLE arterial duplex have been ordered stat and I have spoken with the vascular lab.  Will hold on CTA given her renal function.    -most likely will not need intervention today, but will continue npo until vascular studies have been completed. -heparin gtt to be started for afib and would also be tx for ischemia   Leontine Locket, PA-C [facility] [phone]  I have examined the patient, reviewed and agree with above.  Patient with new onset rapid A. fib.  Was found to have coolness in the right leg versus left.  Confused her physical exam was unlocked reliable.  She was transferred to [facility] for vascular evaluation.  She does have motor and sensory function intact bilateral lower extremities.  She is able to answer questions appropriately.  She is not having any pain currently.  Her daughter and son-in-law are present with her at the hospital.  Brisk Doppler flow in both feet.  Noninvasive studies were obtained showing normal triphasic waveforms bilaterally and duplex shows no evidence of occlusive disease.  No evidence of embolus to right lower extremity.  Will resume diet.  Will not follow actively  Curt Jews, MD 02/10/2018 8:10 PM

## 2018-02-10 NOTE — Progress Notes (Signed)
Pharmacy - IV heparin  Assessment:    Please see note from Raelene Bott, PharmD earlier today for full details.  Briefly, 85 y.o. female on IV heparin for Afib. Supposed to transfer to [facility] for VVS workup   Heparin level drawn at noon today was markedly elevated at 2.16 on 900 units with 2000 unit bolus given > 8 hrs ago. Some concern for draw from inappropriate vein so RN redrew lab from alternate site  Sample sent to lab but per staff volume was insufficient, so new order placed to draw lab  Patient transferred to [facility] before third level could be drawn  Plan:  Handed off patient to [place] who will re-order STAT HL upon arrival  Because I felt true elevated level to be unlikely given the circumstances, I did not hold heparin infusion prior to transfer  Heparin to be adjusted per lab redraw as appropriate   Reuel Boom, PharmD, BCPS [phone] 02/10/2018, 4:32 PM

## 2018-02-10 NOTE — ED Notes (Signed)
ED TO INPATIENT HANDOFF REPORT  Name/Age/Gender Kendra Bray 85 y.o. female  Code Status    Code Status Orders  (From admission, onward)         Start     Ordered   02/09/18 2340  Full code  Continuous     02/09/18 2342        Code Status History    This patient has a current code status but no historical code status.    Advance Directive Documentation     Most Recent Value  Type of Advance Directive  Healthcare Power of Attorney, Living will  Pre-existing out of facility DNR order (yellow form or pink MOST form)  -  "MOST" Form in Place?  -      Home/SNF/Other Home  Chief Complaint short of breath /  Level of Care/Admitting Diagnosis ED Disposition    ED Disposition Condition [place]: [place] [[id number]]  Level of Care: Progressive [102]  Diagnosis: Atrial fibrillation ([place]) [427.31.ICD-9-CM]  Admitting Physician: Elmarie Shiley [[id number]]  Attending Physician: Elmarie Shiley [phone]  Estimated length of stay: 3 - 4 days  Certification:: I certify this patient will need inpatient services for at least 2 midnights  PT Class (Do Not Modify): Inpatient [101]  PT Acc Code (Do Not Modify): Private [1]       Medical History Past Medical History:  Diagnosis Date  . Arthritis   . Breast cancer ([place])    breast - left  . Chicken pox   . CKD (chronic kidney disease), stage IV ([place])   . GERD (gastroesophageal reflux disease)   . Gout   . Hepatitis    jaundice  . Hypertension   . Menopause   . Obesity   . Stroke [facility])    a. occult stroke noted on brain imaging 01/2018.  Marland Kitchen Venous insufficiency     Allergies Allergies  Allergen Reactions  . Prednisone Other (See Comments)    hallucinations    IV Location/Drains/Wounds Patient Lines/Drains/Airways Status   Active Line/Drains/Airways    Name:   Placement date:   Placement time:   Site:   Days:   Peripheral IV (Ped) 02/10/18 Antecubital   02/10/18     0029     less than 1          Labs/Imaging Results for orders placed or performed during the hospital encounter of 02/09/18 (from the past 48 hour(s))  Troponin I - ONCE - STAT     Status: Abnormal   Collection Time: 02/09/18  4:33 PM  Result Value Ref Range   Troponin I 0.79 (HH) <0.03 ng/mL    Comment: CRITICAL RESULT CALLED TO, READ BACK BY AND VERIFIED WITH: [facility] AT 7858 ON 02/09/18 BY N.THOMPSON Performed at [facility], [place] [address] [place], [place] [address]   Magnesium     Status: None   Collection Time: 02/09/18  4:33 PM  Result Value Ref Range   Magnesium 2.4 1.7 - 2.4 mg/dL    Comment: Performed at [facility], [place] [address], [place], [place] [address]  TSH     Status: Abnormal   Collection Time: 02/09/18  4:33 PM  Result Value Ref Range   TSH 4.678 (H) 0.350 - 4.500 uIU/mL    Comment: Performed by a 3rd Generation assay with a functional sensitivity of <=0.01 uIU/mL. Performed at [facility], [place] [address] [place], [place] [address]  I-STAT, chem 8     Status: Abnormal   Collection Time: 02/09/18  4:37 PM  Result Value Ref Range   Sodium 138 135 - 145 mmol/L   Potassium 4.3 3.5 - 5.1 mmol/L   Chloride 111 98 - 111 mmol/L   BUN 46 (H) 8 - 23 mg/dL   Creatinine, Ser 1.90 (H) 0.44 - 1.00 mg/dL   Glucose, Bld 148 (H) 70 - 99 mg/dL   Calcium, Ion 1.33 1.15 - 1.40 mmol/L   TCO2 18 (L) 22 - 32 mmol/L   Hemoglobin 14.6 12.0 - 15.0 g/dL   HCT 43.0 36.0 - 46.0 %  CG4 I-STAT (Lactic acid)     Status: Abnormal   Collection Time: 02/09/18  4:37 PM  Result Value Ref Range   Lactic Acid, Venous 2.46 (HH) 0.5 - 1.9 mmol/L   Comment NOTIFIED PHYSICIAN   Comprehensive metabolic panel     Status: Abnormal   Collection Time: 02/09/18  4:42 PM  Result Value Ref Range   Sodium 140 135 - 145 mmol/L   Potassium 4.3 3.5 - 5.1 mmol/L   Chloride 108 98 - 111 mmol/L   CO2 19 (L) 22 - 32 mmol/L   Glucose, Bld 147  (H) 70 - 99 mg/dL   BUN 54 (H) 8 - 23 mg/dL   Creatinine, Ser 2.02 (H) 0.44 - 1.00 mg/dL   Calcium 10.3 8.9 - 10.3 mg/dL   Total Protein 6.8 6.5 - 8.1 g/dL   Albumin 4.1 3.5 - 5.0 g/dL   AST 41 15 - 41 U/L   ALT 56 (H) 0 - 44 U/L   Alkaline Phosphatase 80 38 - 126 U/L   Total Bilirubin 1.1 0.3 - 1.2 mg/dL   GFR calc non Af Amer 21 (L) >60 mL/min   GFR calc Af Amer 25 (L) >60 mL/min   Anion gap 13 5 - 15    Comment: Performed at [facility], [place] [address], [place], [place] [address]  CBC     Status: Abnormal   Collection Time: 02/09/18  4:42 PM  Result Value Ref Range   WBC 12.9 (H) 4.0 - 10.5 K/uL   RBC 4.40 3.87 - 5.11 MIL/uL   Hemoglobin 14.0 12.0 - 15.0 g/dL   HCT 44.4 36.0 - 46.0 %   MCV 100.9 (H) 80.0 - 100.0 fL   MCH 31.8 26.0 - 34.0 pg   MCHC 31.5 30.0 - 36.0 g/dL   RDW 14.7 11.5 - 15.5 %   Platelets 279 150 - 400 K/uL   nRBC 0.2 0.0 - 0.2 %    Comment: Performed at [facility], [place] [address] [place], [place] [address]  Blood culture (routine x 2)     Status: None (Preliminary result)   Collection Time: 02/09/18  4:42 PM  Result Value Ref Range   Specimen Description      BLOOD RIGHT ANTECUBITAL Performed at [facility], [place] [address], [place], [place] [address]    Special Requests      BOTTLES DRAWN AEROBIC AND ANAEROBIC Blood Culture results may not be optimal due to an excessive volume of blood received in culture bottles Performed at [place] [address], [place], [place] [address]    Culture      NO GROWTH < 24 HOURS Performed at [place] [address], [place], [place] [address]    Report Status PENDING   Glucose, capillary  Status: Abnormal   Collection Time: 02/09/18  5:02 PM  Result Value Ref Range   Glucose-Capillary 138 (H) 70 - 99 mg/dL  Urinalysis, Routine w reflex microscopic     Status: Abnormal   Collection Time: 02/09/18  8:03 PM  Result  Value Ref Range   Color, Urine YELLOW YELLOW   APPearance CLEAR CLEAR   Specific Gravity, Urine 1.019 1.005 - 1.030   pH 5.0 5.0 - 8.0   Glucose, UA NEGATIVE NEGATIVE mg/dL   Hgb urine dipstick NEGATIVE NEGATIVE   Bilirubin Urine NEGATIVE NEGATIVE   Ketones, ur 5 (A) NEGATIVE mg/dL   Protein, ur NEGATIVE NEGATIVE mg/dL   Nitrite NEGATIVE NEGATIVE   Leukocytes, UA NEGATIVE NEGATIVE    Comment: Performed at [place] [address] [place], [place] [address]  I-Stat CG4 Lactic Acid, ED     Status: None   Collection Time: 02/09/18  8:38 PM  Result Value Ref Range   Lactic Acid, Venous 1.80 0.5 - 1.9 mmol/L  CBC     Status: Abnormal   Collection Time: 02/10/18  8:30 AM  Result Value Ref Range   WBC 11.8 (H) 4.0 - 10.5 K/uL   RBC 3.86 (L) 3.87 - 5.11 MIL/uL   Hemoglobin 12.3 12.0 - 15.0 g/dL   HCT 40.0 36.0 - 46.0 %   MCV 103.6 (H) 80.0 - 100.0 fL   MCH 31.9 26.0 - 34.0 pg   MCHC 30.8 30.0 - 36.0 g/dL   RDW 14.9 11.5 - 15.5 %   Platelets 218 150 - 400 K/uL   nRBC 0.3 (H) 0.0 - 0.2 %    Comment: Performed at [facility], [place] [address] [place], [place] [address]  Troponin I - Now Then Q6H     Status: Abnormal   Collection Time: 02/10/18  8:30 AM  Result Value Ref Range   Troponin I 0.59 (HH) <0.03 ng/mL    Comment: CRITICAL VALUE NOTED.  VALUE IS CONSISTENT WITH PREVIOUSLY REPORTED AND CALLED VALUE. Performed at [facility], [place] [address] [place], [place] [address]   Vitamin B12     Status: Abnormal   Collection Time: 02/10/18  9:17 AM  Result Value Ref Range   Vitamin B-12 2,568 (H) 180 - 914 pg/mL    Comment: RESULTS CONFIRMED BY MANUAL DILUTION (NOTE) This assay is not validated for testing neonatal or myeloproliferative syndrome specimens for Vitamin B12 levels. Performed at [facility], [place] [address], [place], [place] [address]   Basic metabolic panel     Status: Abnormal    Collection Time: 02/10/18  9:17 AM  Result Value Ref Range   Sodium 139 135 - 145 mmol/L   Potassium 4.3 3.5 - 5.1 mmol/L   Chloride 110 98 - 111 mmol/L   CO2 18 (L) 22 - 32 mmol/L   Glucose, Bld 138 (H) 70 - 99 mg/dL   BUN 55 (H) 8 - 23 mg/dL   Creatinine, Ser 1.77 (H) 0.44 - 1.00 mg/dL   Calcium 9.4 8.9 - 10.3 mg/dL   GFR calc non Af Amer 25 (L) >60 mL/min   GFR calc Af Amer 29 (L) >60 mL/min   Anion gap 11 5 - 15    Comment: Performed at [facility], [place] [address], [place], [place] [address]  Ammonia     Status: None   Collection Time: 02/10/18 10:30 AM  Result Value Ref Range   Ammonia 27 9 -  35 umol/L    Comment: Performed at [facility], [place] [address] [place], [place] [address]   Ct Head Wo Contrast  Result Date: 02/09/2018 CLINICAL DATA:  85 year old female with unexplained altered mental status. Visual hallucinations, confusion and shortness of breath for 2 days. EXAM: CT HEAD WITHOUT CONTRAST TECHNIQUE: Contiguous axial images were obtained from the base of the skull through the vertex without intravenous contrast. COMPARISON:  None. FINDINGS: Brain: Chronic encephalomalacia in the right inferior frontal gyrus with regional white matter gliosis. Patchy and confluent cerebral white matter hypodensity elsewhere. No midline shift, ventriculomegaly, mass effect, evidence of mass lesion, intracranial hemorrhage or evidence of cortically based acute infarction. No other cortical encephalomalacia. Vascular: Calcified atherosclerosis at the skull base. No suspicious intracranial vascular hyperdensity. Skull: Negative. Sinuses/Orbits: Visualized paranasal sinuses and mastoids are well pneumatized. Other: Negative visible orbits aside from postoperative changes. Trace intravenous gas about the right temporalis muscle, probably related to IV access. Other visualized scalp soft tissues are within normal limits. IMPRESSION: 1. Chronic right inferior  frontal encephalomalacia is probably due to a prior anterior division right MCA infarct. 2. Moderate superimposed cerebral white matter changes, most commonly due to chronic small vessel disease. 3. No acute intracranial abnormality. Electronically Signed   By: Genevie Ann M.D.   On: 02/09/2018 18:25   Mr Brain Wo Contrast  Result Date: 02/09/2018 CLINICAL DATA:  Altered mental status EXAM: MRI HEAD WITHOUT CONTRAST TECHNIQUE: Multiplanar, multiecho pulse sequences of the brain and surrounding structures were obtained without intravenous contrast. COMPARISON:  Head CT 02/09/2018 FINDINGS: BRAIN: There is no acute infarct, acute hemorrhage, hydrocephalus or extra-axial collection. The midline structures are normal. No midline shift or other mass effect. Old right frontal lobe infarct encephalomalacia. Diffuse confluent hyperintense T2-weighted signal within the periventricular, deep and juxtacortical white matter, most commonly due to chronic ischemic microangiopathy. Generalized atrophy without lobar predilection. Susceptibility-sensitive sequences show no chronic microhemorrhage or superficial siderosis. VASCULAR: Major intracranial arterial and venous sinus flow voids are normal. SKULL AND UPPER CERVICAL SPINE: Calvarial bone marrow signal is normal. There is no skull base mass. Visualized upper cervical spine and soft tissues are normal. SINUSES/ORBITS: No fluid levels or advanced mucosal thickening. No mastoid or middle ear effusion. The orbits are normal. IMPRESSION: 1. No acute intracranial abnormality. 2. Chronic ischemic microangiopathy and generalized advanced atrophy. 3. Old right frontal lobe infarct associated encephalomalacia. Electronically Signed   By: Ulyses Jarred M.D.   On: 02/09/2018 22:25   Dg Chest Portable 1 View  Result Date: 02/09/2018 CLINICAL DATA:  Chest pain and shortness of breath for 2 days EXAM: PORTABLE CHEST 1 VIEW COMPARISON:  04/08/2015 FINDINGS: Cardiac shadow is enlarged but  stable. The lungs are well aerated bilaterally. No focal infiltrate or sizable effusion is seen. Degenerative changes of the thoracic spine are noted. IMPRESSION: No acute abnormality noted. Electronically Signed   By: Inez Catalina M.D.   On: 02/09/2018 17:00    Pending Labs Unresulted Labs (From admission, onward)    Start     Ordered   02/11/18 0500  Hemoglobin A1c  Tomorrow morning,   R     02/10/18 0931   02/11/18 0500  Lipid panel  Tomorrow morning,   R     02/10/18 1031   02/11/18 0500  Comprehensive metabolic panel  Tomorrow morning,   R     02/10/18 1031   02/11/18 0500  CBC  Tomorrow morning,   R     02/10/18 1031  02/10/18 1200  Heparin level (unfractionated)  Once-Timed,   STAT     02/10/18 0305   02/10/18 0903  T3, free  Once,   R     02/10/18 0903   02/10/18 0903  T4, free  Once,   R     02/10/18 0903   02/09/18 2341  Troponin I - Now Then Q6H  Now then every 6 hours,   R     02/09/18 2342   02/09/18 1633  Blood culture (routine x 2)  BLOOD CULTURE X 2,   STAT     02/09/18 1632          Vitals/Pain Today's Vitals   02/10/18 0830 02/10/18 0900 02/10/18 0930 02/10/18 1201  BP: (!) 126/91 (!) 131/107 129/89 (!) 104/59  Pulse: (!) 131 (!) 132 (!) 134 64  Resp: (!) 22 (!) 26 16 (!) 34  Temp:    97.8 F (36.6 C)  TempSrc:    Oral  SpO2: 99% 99% 99% 96%  Weight:      Height:        Isolation Precautions No active isolations  Medications Medications  diltiazem (CARDIZEM) 1 mg/mL load via infusion 10 mg (10 mg Intravenous Bolus from Bag 02/09/18 1644)    And  diltiazem (CARDIZEM) 100 mg in dextrose 5% 137mL (1 mg/mL) infusion (7.5 mg/hr Intravenous Rate/Dose Change 02/10/18 0531)  allopurinol (ZYLOPRIM) tablet 100 mg (100 mg Oral Given 02/10/18 0931)  aspirin tablet 325 mg (325 mg Oral Given 02/10/18 0931)  acetaminophen (TYLENOL) tablet 650 mg (has no administration in time range)    Or  acetaminophen (TYLENOL) suppository 650 mg (has no administration in time  range)  ondansetron (ZOFRAN) tablet 4 mg (has no administration in time range)    Or  ondansetron (ZOFRAN) injection 4 mg (has no administration in time range)  metoprolol tartrate (LOPRESSOR) tablet 25 mg (25 mg Oral Given 02/10/18 0932)  heparin ADULT infusion 100 units/mL (25000 units/275mL sodium chloride 0.45%) (900 Units/hr Intravenous New Bag/Given 02/10/18 0330)  sodium chloride 0.9 % bolus 1,000 mL (0 mLs Intravenous Stopped 02/09/18 1800)  sodium chloride 0.9 % bolus 1,000 mL (1,000 mLs Intravenous New Bag/Given 02/09/18 1858)  metoprolol tartrate (LOPRESSOR) tablet 25 mg (25 mg Oral Given 02/09/18 2109)  heparin bolus via infusion 2,000 Units (2,000 Units Intravenous Bolus from Bag 02/10/18 0330)  LORazepam (ATIVAN) tablet 0.25 mg (0.25 mg Oral Given 02/10/18 0411)    Mobility walks with device

## 2018-02-10 NOTE — Consult Note (Addendum)
Cardiology Consultation:   Patient ID: Kendra Bray; [id number]; 07-11-1927   Admit date: 02/09/2018 Date of Consult: 02/10/2018  Primary Care Provider: Dorena Cookey, MD Primary Cardiologist: Sanda Klein, MD - new Primary Electrophysiologist:  None  Chief Complaint: confusion, hallucinations  Patient Profile:   Kendra Bray is a 85 y.o. female with a hx of CKD stage III-IV (prior Cr 1.5-2), HTN, old stroke on imaging this admission, venous insufficiency, reflux esophagitis, breast CA s/p mastectomy, obesity, OA, gout, hepatitis who is being seen today for the evaluation of new onset AF RVR and elevated troponin at the request of Dr. Tyrell Antonio.  History of Present Illness:   The patient is currently alone in her room and is unable to provide any reliable history. She is speaking nonsensically. Apparently the patient has not been able to be reached for 4 days and daughter arrived from [place] to check on her. She was confused and having hallucinations. In speaking with the patient she is able to answer some questions but volunteers answers to questions I never asked or unrelated to the conversation. I asked her if she feels palpitations and she makes reference to recently being in the hospital with a HR in the 130s. I think she might be talking about today. She does report feeling a little short of breath. She denies any CP.   She was found to be in AF RVR and given a diltiazem bolus and started on a drip. It sounds like that was stopped because of hypotension then resumed after blood pressure improved. At one point last night (~2200) she had HR in the upper 30s-low 40s for a brief period of time. More recently she has been persistently tachycardic in the 130s. Diltiazem drip remains @ 10mg /hr and she was started on oral Lopressor 25mg  BID. The fixed HR appeared to be SVT vs atrial flutter. EKG was requested but she went back to AF with variable rate before this could be captured.  Labs show leukocytosis with WBC 12.9 and macrocytosis, troponin 0.79->0.59, lactic acidosis 2.46-> normalized to 1.80, abnormal TSH 4.678, BUN 54/Cr 2.02, K 4.3, glucose 147, UA with ketones but otherwise benign, blood cx pending, mild temp elevation of 99.1 in the ER. CXR showed stable cardiac enlargement but otherwise no specific acute findings. Imaging of the brain (CT/MRI) shows no acute intracranial pathology; chronic ischemic microangiopathy and old right frontal lobe infarct with associated encephalomalacia.  Of note, her right forefoot is notably much cooler than the left but she is not complaining of any acute limb pain. Blood pressures are being checked on that leg but only q30 minutes, so I would not expect this to cause this degree of coldness. The extremity had been under the blanket just as the contralateral one.  CHADSVASC is tentatively 6 for HTN, age x 2, stroke x 2 (occult), female. LVEF unknown and no prior A1C on file. F/u BMET pending.   Past Medical History:  Diagnosis Date  . Arthritis   . Breast cancer ([place])    breast - left  . Chicken pox   . CKD (chronic kidney disease), stage IV ([place])   . GERD (gastroesophageal reflux disease)   . Gout   . Hepatitis    jaundice  . Hypertension   . Menopause   . Obesity   . Stroke [facility])    a. occult stroke noted on brain imaging 01/2018.  Marland Kitchen Venous insufficiency     Past Surgical History:  Procedure Laterality Date  .  APPENDECTOMY    . BREAST SURGERY     cancer  . CHOLECYSTECTOMY    . TONSILLECTOMY AND ADENOIDECTOMY    . TOTAL KNEE ARTHROPLASTY     right     Inpatient Medications: Scheduled Meds: . allopurinol  100 mg Oral Daily  . aspirin  325 mg Oral Daily  . metoprolol tartrate  25 mg Oral BID   Continuous Infusions: . diltiazem (CARDIZEM) infusion 10 mg/hr (02/10/18 0531)  . heparin 900 Units/hr (02/10/18 0330)   PRN Meds: acetaminophen **OR** acetaminophen, ondansetron **OR** ondansetron (ZOFRAN) IV  Home  Meds: Prior to Admission medications   Medication Sig Start Date End Date Taking? Authorizing Provider  allopurinol (ZYLOPRIM) 100 MG tablet Take 10,200 mg by mouth daily.  04/19/15  Yes [provider]  aspirin 325 MG tablet Take 325 mg by mouth daily.   Yes [provider]  cholecalciferol (VITAMIN D) 1000 UNITS tablet Take 2,000 Units by mouth daily.    Yes [provider]  cyanocobalamin (,VITAMIN B-12,) 1000 MCG/ML injection Inject 1,000 mcg into the muscle every 30 (thirty) days. 11/16/17  Yes [provider]  famotidine (PEPCID) 10 MG tablet Take 10 mg by mouth daily as needed for heartburn or indigestion.   Yes [provider]  NON FORMULARY Stool softner   Yes [provider]    Allergies:    Allergies  Allergen Reactions  . Prednisone Other (See Comments)    hallucinations    Social History:   Social History   Socioeconomic History  . Marital status: Widowed    Spouse name: Not on file  . Number of children: Not on file  . Years of education: Not on file  . Highest education level: Not on file  Occupational History  . Not on file  Social Needs  . Financial resource strain: Not on file  . Food insecurity:    Worry: Not on file    Inability: Not on file  . Transportation needs:    Medical: Not on file    Non-medical: Not on file  Tobacco Use  . Smoking status: Never Smoker  . Smokeless tobacco: Never Used  Substance and Sexual Activity  . Alcohol use: No  . Drug use: No  . Sexual activity: Not on file  Lifestyle  . Physical activity:    Days per week: Not on file    Minutes per session: Not on file  . Stress: Not on file  Relationships  . Social connections:    Talks on phone: Not on file    Gets together: Not on file    Attends religious service: Not on file    Active member of club or organization: Not on file    Attends meetings of clubs or organizations: Not on file    Relationship status: Not on  file  . Intimate partner violence:    Fear of current or ex partner: Not on file    Emotionally abused: Not on file    Physically abused: Not on file    Forced sexual activity: Not on file  Other Topics Concern  . Not on file  Social History Narrative  . Not on file    Family History:   The patient's family history includes Cancer in an other family member; Coronary artery disease in an other family member.  ROS:  Unreliable historian so unable to accurately obtain  Physical Exam/Data:   Vitals:   02/10/18 0700 02/10/18 0800 02/10/18 0830 02/10/18 0900  BP: (!) 112/91 106/70 (!) 126/91 (!) 131/107  Pulse: (!) 131 (!) 44 (!) 131 (!) 132  Resp: (!) 32 (!) 36 (!) 22 (!) 26  Temp:      TempSrc:      SpO2: 97% 99% 99% 99%  Weight:      Height:        Intake/Output Summary (Last 24 hours) at 02/10/2018 0935 Last data filed at 02/09/2018 1800 Gross per 24 hour  Intake 1000 ml  Output -  Net 1000 ml   Last 3 Weights 02/10/2018 04/30/2015 05/01/2014  Weight (lbs) 162 lb 4.1 oz 157 lb 166 lb 11.2 oz  Weight (kg) 73.6 kg 71.215 kg 75.615 kg    Body mass index is 31.69 kg/m.  General: Well developed, well nourished obese WF, in no acute distress. Head: Normocephalic, atraumatic, sclera non-icteric, no xanthomas, nares are without discharge Neck: Negative for carotid bruits. JVD not elevated. Lungs: Poor inspiratory effort, coarse BS throughout. Breathing is unlabored. Heart: Irregularly irregular with rapid rate, with S1 S2. No murmurs, rubs, or gallops appreciated. Abdomen: Soft, non-tender, non-distended with normoactive bowel sounds. No hepatomegaly. No rebound/guarding. No obvious abdominal masses. Msk:  Strength and tone appear normal for age. Extremities: No clubbing or cyanosis. No edema. Right forefoot significantly cooler than left but no acute pain, pallor or parasthesias reported. Pedal pulses cannot be palpated in either extremity Neuro: Alert and oriented to self only.  Cannot state where she is or what the date is.  Psych:  Pleasant affect  EKG:  The EKG was personally reviewed and demonstrates atrial fib RVR with LAFB, nonspecific STT changes  Relevant CV Studies: No prior values  Laboratory Data:  Chemistry Recent Labs  Lab 02/09/18 1637 02/09/18 1642  NA 138 140  K 4.3 4.3  CL 111 108  CO2  --  19*  GLUCOSE 148* 147*  BUN 46* 54*  CREATININE 1.90* 2.02*  CALCIUM  --  10.3  GFRNONAA  --  21*  GFRAA  --  25*  ANIONGAP  --  13    Recent Labs  Lab 02/09/18 1642  PROT 6.8  ALBUMIN 4.1  AST 41  ALT 56*  ALKPHOS 80  BILITOT 1.1   Hematology Recent Labs  Lab 02/09/18 1637 02/09/18 1642 02/10/18 0830  WBC  --  12.9* 11.8*  RBC  --  4.40 3.86*  HGB 14.6 14.0 12.3  HCT 43.0 44.4 40.0  MCV  --  100.9* 103.6*  MCH  --  31.8 31.9  MCHC  --  31.5 30.8  RDW  --  14.7 14.9  PLT  --  279 218   Cardiac Enzymes Recent Labs  Lab 02/09/18 1633 02/10/18 0830  TROPONINI 0.79* 0.59*   No results for input(s): TROPIPOC in the last 168 hours.  BNPNo results for input(s): BNP, PROBNP in the last 168 hours.  DDimer No results for input(s): DDIMER in the last 168 hours.  Radiology/Studies:  Ct Head Wo Contrast  Result Date: 02/09/2018 CLINICAL DATA:  85 year old female with unexplained altered mental status. Visual hallucinations, confusion and shortness of breath for 2 days. EXAM: CT HEAD WITHOUT CONTRAST TECHNIQUE: Contiguous axial images were obtained from the base of the skull through the vertex without intravenous contrast. COMPARISON:  None. FINDINGS: Brain: Chronic encephalomalacia in the right inferior frontal gyrus with regional white matter gliosis. Patchy and confluent cerebral white matter hypodensity elsewhere. No midline shift, ventriculomegaly, mass effect, evidence of mass lesion, intracranial hemorrhage or evidence of  cortically based acute infarction. No other cortical encephalomalacia. Vascular: Calcified atherosclerosis at  the skull base. No suspicious intracranial vascular hyperdensity. Skull: Negative. Sinuses/Orbits: Visualized paranasal sinuses and mastoids are well pneumatized. Other: Negative visible orbits aside from postoperative changes. Trace intravenous gas about the right temporalis muscle, probably related to IV access. Other visualized scalp soft tissues are within normal limits. IMPRESSION: 1. Chronic right inferior frontal encephalomalacia is probably due to a prior anterior division right MCA infarct. 2. Moderate superimposed cerebral white matter changes, most commonly due to chronic small vessel disease. 3. No acute intracranial abnormality. Electronically Signed   By: Genevie Ann M.D.   On: 02/09/2018 18:25   Mr Brain Wo Contrast  Result Date: 02/09/2018 CLINICAL DATA:  Altered mental status EXAM: MRI HEAD WITHOUT CONTRAST TECHNIQUE: Multiplanar, multiecho pulse sequences of the brain and surrounding structures were obtained without intravenous contrast. COMPARISON:  Head CT 02/09/2018 FINDINGS: BRAIN: There is no acute infarct, acute hemorrhage, hydrocephalus or extra-axial collection. The midline structures are normal. No midline shift or other mass effect. Old right frontal lobe infarct encephalomalacia. Diffuse confluent hyperintense T2-weighted signal within the periventricular, deep and juxtacortical white matter, most commonly due to chronic ischemic microangiopathy. Generalized atrophy without lobar predilection. Susceptibility-sensitive sequences show no chronic microhemorrhage or superficial siderosis. VASCULAR: Major intracranial arterial and venous sinus flow voids are normal. SKULL AND UPPER CERVICAL SPINE: Calvarial bone marrow signal is normal. There is no skull base mass. Visualized upper cervical spine and soft tissues are normal. SINUSES/ORBITS: No fluid levels or advanced mucosal thickening. No mastoid or middle ear effusion. The orbits are normal. IMPRESSION: 1. No acute intracranial  abnormality. 2. Chronic ischemic microangiopathy and generalized advanced atrophy. 3. Old right frontal lobe infarct associated encephalomalacia. Electronically Signed   By: Ulyses Jarred M.D.   On: 02/09/2018 22:25   Dg Chest Portable 1 View  Result Date: 02/09/2018 CLINICAL DATA:  Chest pain and shortness of breath for 2 days EXAM: PORTABLE CHEST 1 VIEW COMPARISON:  04/08/2015 FINDINGS: Cardiac shadow is enlarged but stable. The lungs are well aerated bilaterally. No focal infiltrate or sizable effusion is seen. Degenerative changes of the thoracic spine are noted. IMPRESSION: No acute abnormality noted. Electronically Signed   By: Inez Catalina M.D.   On: 02/09/2018 17:00    Assessment and Plan:   1. Acute encephalopathy - imaging is unremarkable for acute finding. Suspect whatever process that is driving the confusion is also a physiologic stressor which has prompted her atrial arrhythmia. Mental status changes persist - needs more extensive eval per IM. She gets steroid injections for her back per ED note so there is question of steroid induced psychosis. Leukocytosis, macrocytosis, lactic acidosis also noted on labs. Blood cultures pending.   2. Newly recognized atrial fib/possible flutter as well - unknown duration. She appears to have atrial fib with variable HR as well as a fixed HR in the 130s likely representing atrial flutter vs SVT. Repeat EKG was requested but she went back to Afib by the time that was captured. Continue current diltiazem and metoprolol as ordered, current HR 105. Pending echocardiogram. TSH mildly elevated - does not typically drive tachycardia but will need further eval by IM. Heparin per pharmacy has been started. I also alerted Dr. Tyrell Antonio to the temperature difference in her lower extremities who will be assessing doppler pulses and possible imaging if indicated. Not clear if this is acute issue or related to the BP cuff on that leg, but this was only  being checked q30  mins. Pending further discussion with MD.  3. Elevated troponin - she is not reporting any chest pain. Continue heparin for now - 2nd value was downtrending. Await echocardiogram. Consider decreasing ASA to 81mg  daily (presently ordered as 325mg  by IM but not clear if this is for other reasons). Continue BB. Check lipids in AM. Will discuss further management with Dr. Sallyanne Kuster. Given advanced age, CKD and acute confusion patient is not currently a candidate for invasive management but decisions can be made as more information becomes available.  4. CKD stage IV - prior baseline Cr appears 1.5-2, similar to this admission.   For questions or updates, please contact [place] Please consult [email or web address] for contact info under Cardiology/STEMI.    Signed, Charlie Pitter, PA-C  02/10/2018 9:35 AM   I have seen and examined the patient along with Charlie Pitter, PA-C .  I have reviewed the chart, notes and new data.  I agree with PA's note.  Key new complaints: she remains confused, history obtained from chart and from the patient's daughter.  Appears comfortable lying fully flat in bed and denies chest pain or dyspnea. Key examination changes: Irregularly irregular rhythm, decreased breath sounds in both lung bases, irregular rhythm, holosystolic murmur at the apex, cool toes of the right foot Key new findings / data: Atrial fibrillation on electrocardiogram, minimal abnormalities in cardiac troponin I, lactic acid and renal parameters suggestive of hypovolemia.  Limited bedside review of her echo images shows global hypokinesia of the left ventricle with an ejection fraction of about 30%, (maybe with some regional wall motion abnormalities particularly obvious in the posterior wall).  Valve has myxomatous changes without overt prolapse.  There is moderate mitral regurgitation centrally directed.  The left atrium is dilated.  Left pleural effusion is seen.  RI shows evidence of old right frontal  infarct, consider embolic event in the past.  PLAN: At this point she appears to be compensated hemodynamically, without overt heart failure.  However, she has marked reduction left ventricular systolic function.  Avoid excessive intravenous fluids. Will need work-up for cause of her cardiomyopathy.  Because of her age, extensive multivessel CAD is a prime possibility, but tachycardia cardiomyopathy is also considered.  Evaded troponin is not necessarily diagnostic of a recent acute coronary event, but is a poor prognostic sign. Rate control should be preferably attained with beta-blockers rather than diltiazem. It is possible we will need to perform coronary angiography.  We will use intravenous heparin for anticoagulation for the time being until there is no plan for invasive procedures. Vascular consultation is requested to evaluate for possible ischemic limb.  This could be related to PAD or embolism from atrial fibrillation.  Sanda Klein, MD, [place] [phone] 02/10/2018, 1:06 PM

## 2018-02-10 NOTE — Progress Notes (Signed)
PROGRESS NOTE    Kendra Bray [facility]  TWS:[id number] DOB: 1927-04-27 DOA: 02/09/2018 PCP: Dorena Cookey, MD    Brief Narrative: 85 year old with past medical history significant for chronic kidney disease, breast cancer in remission, gout who was brought to the ER by family after patient was found to be confused.  Patient was not being able to be reached by family for the last 4 days prior to admission.  Daughter came from [place] to check on patient and found that her mother was more confused, having hallucination. Evaluation in the ED patient was found to be in A. fib RVR, his confusion persist. He was also found to have right lower extremity cooler than the left.  She has TBL pulses on the right but decreased pedal pulses.  She does not have any evidence of cyanosis or or black discolored discoloration in the right foot.  Case was discussed with vascular doctor and plan is for patient to be transferred to [facility] for urgent evaluation..    Assessment & Plan:   Principal Problem:   Atrial fibrillation with RVR (HCC) Active Problems:   BREAST CANCER, HX OF   Acute encephalopathy   CKD (chronic kidney disease), stage III (HCC)  1-A. fib with RVR;  On IV Cardizem, IV heparin. On oral metoprolol. Heart rate was still elevated in the 130 range.  Cardiology has been consulted.  2-acute metabolic encephalopathy; MRI was negative for acute stroke. I will get B12 level, ammonia level, TSH. Chest x ray negative for infection, UA negative. Follow blood culture.   CKD stage III;  Cr at 2 on admission.  Prior cr 1.6---2.0 Improved after IV fluids.   Right foot, concern for ischemia, patient right foot is cold to palpation more than left.  On IV heparin.  Pulse heard on doppler.  Plan to transfer to [facility] for vascular evaluation.   Estimated body mass index is 31.69 kg/m as calculated from the following:   Height as of this encounter: 5' (1.524 m).   Weight as of this encounter:  73.6 kg.   DVT prophylaxis: Heparin.  Code Status: Full code Family Communication: Daughter at bedside.  Disposition Plan: Transfer to [facility] for vascular evaluation  Consultants:   Cardiology  Vascular.    Procedures:    Antimicrobials: none   Subjective: Alert, confuse hard of hearing.   Objective: Vitals:   02/10/18 0530 02/10/18 0531 02/10/18 0700 02/10/18 0800  BP: (!) 121/91 (!) 121/91 (!) 112/91 106/70  Pulse: (!) 131 (!) 131 (!) 131 (!) 44  Resp: (!) 29 20 (!) 32 (!) 36  Temp:      TempSrc:      SpO2: 96%  97% 99%  Weight:      Height:        Intake/Output Summary (Last 24 hours) at 02/10/2018 0833 Last data filed at 02/09/2018 1800 Gross per 24 hour  Intake 1000 ml  Output -  Net 1000 ml   Filed Weights   02/10/18 0300  Weight: 73.6 kg    Examination:  General exam: Appears calm and comfortable  Respiratory system: Clear to auscultation. Respiratory effort normal. Cardiovascular system: S1 & S2 heard, RRR. No JVD, murmurs, rubs, gallops or clicks. Gastrointestinal system: Abdomen is nondistended, soft and nontender. No organomegaly or masses felt. Normal bowel sounds heard. Central nervous system: Alert and not oriented.  Extremities: Symmetric 5 x 5 power. Right foot cooler than lest, no cyanosis no discoloration    Data Reviewed:  I have personally reviewed following labs and imaging studies  CBC: Recent Labs  Lab 02/09/18 1637 02/09/18 1642  WBC  --  12.9*  HGB 14.6 14.0  HCT 43.0 44.4  MCV  --  100.9*  PLT  --  742   Basic Metabolic Panel: Recent Labs  Lab 02/09/18 1633 02/09/18 1637 02/09/18 1642  NA  --  138 140  K  --  4.3 4.3  CL  --  111 108  CO2  --   --  19*  GLUCOSE  --  148* 147*  BUN  --  46* 54*  CREATININE  --  1.90* 2.02*  CALCIUM  --   --  10.3  MG 2.4  --   --    GFR: Estimated Creatinine Clearance: 17.2 mL/min (A) (by C-G formula based on SCr of 2.02 mg/dL (H)). Liver Function Tests: Recent Labs    Lab 02/09/18 1642  AST 41  ALT 56*  ALKPHOS 80  BILITOT 1.1  PROT 6.8  ALBUMIN 4.1   No results for input(s): LIPASE, AMYLASE in the last 168 hours. No results for input(s): AMMONIA in the last 168 hours. Coagulation Profile: No results for input(s): INR, PROTIME in the last 168 hours. Cardiac Enzymes: Recent Labs  Lab 02/09/18 1633  TROPONINI 0.79*   BNP (last 3 results) No results for input(s): PROBNP in the last 8760 hours. HbA1C: No results for input(s): HGBA1C in the last 72 hours. CBG: Recent Labs  Lab 02/09/18 1702  GLUCAP 138*   Lipid Profile: No results for input(s): CHOL, HDL, LDLCALC, TRIG, CHOLHDL, LDLDIRECT in the last 72 hours. Thyroid Function Tests: Recent Labs    02/09/18 1633  TSH 4.678*   Anemia Panel: No results for input(s): VITAMINB12, FOLATE, FERRITIN, TIBC, IRON, RETICCTPCT in the last 72 hours. Sepsis Labs: Recent Labs  Lab 02/09/18 1637 02/09/18 2038  LATICACIDVEN 2.46* 1.80    No results found for this or any previous visit (from the past 240 hour(s)).       Radiology Studies: Ct Head Wo Contrast  Result Date: 02/09/2018 CLINICAL DATA:  85 year old female with unexplained altered mental status. Visual hallucinations, confusion and shortness of breath for 2 days. EXAM: CT HEAD WITHOUT CONTRAST TECHNIQUE: Contiguous axial images were obtained from the base of the skull through the vertex without intravenous contrast. COMPARISON:  None. FINDINGS: Brain: Chronic encephalomalacia in the right inferior frontal gyrus with regional white matter gliosis. Patchy and confluent cerebral white matter hypodensity elsewhere. No midline shift, ventriculomegaly, mass effect, evidence of mass lesion, intracranial hemorrhage or evidence of cortically based acute infarction. No other cortical encephalomalacia. Vascular: Calcified atherosclerosis at the skull base. No suspicious intracranial vascular hyperdensity. Skull: Negative. Sinuses/Orbits:  Visualized paranasal sinuses and mastoids are well pneumatized. Other: Negative visible orbits aside from postoperative changes. Trace intravenous gas about the right temporalis muscle, probably related to IV access. Other visualized scalp soft tissues are within normal limits. IMPRESSION: 1. Chronic right inferior frontal encephalomalacia is probably due to a prior anterior division right MCA infarct. 2. Moderate superimposed cerebral white matter changes, most commonly due to chronic small vessel disease. 3. No acute intracranial abnormality. Electronically Signed   By: Genevie Ann M.D.   On: 02/09/2018 18:25   Mr Brain Wo Contrast  Result Date: 02/09/2018 CLINICAL DATA:  Altered mental status EXAM: MRI HEAD WITHOUT CONTRAST TECHNIQUE: Multiplanar, multiecho pulse sequences of the brain and surrounding structures were obtained without intravenous contrast. COMPARISON:  Head CT 02/09/2018 FINDINGS:  BRAIN: There is no acute infarct, acute hemorrhage, hydrocephalus or extra-axial collection. The midline structures are normal. No midline shift or other mass effect. Old right frontal lobe infarct encephalomalacia. Diffuse confluent hyperintense T2-weighted signal within the periventricular, deep and juxtacortical white matter, most commonly due to chronic ischemic microangiopathy. Generalized atrophy without lobar predilection. Susceptibility-sensitive sequences show no chronic microhemorrhage or superficial siderosis. VASCULAR: Major intracranial arterial and venous sinus flow voids are normal. SKULL AND UPPER CERVICAL SPINE: Calvarial bone marrow signal is normal. There is no skull base mass. Visualized upper cervical spine and soft tissues are normal. SINUSES/ORBITS: No fluid levels or advanced mucosal thickening. No mastoid or middle ear effusion. The orbits are normal. IMPRESSION: 1. No acute intracranial abnormality. 2. Chronic ischemic microangiopathy and generalized advanced atrophy. 3. Old right frontal lobe  infarct associated encephalomalacia. Electronically Signed   By: Ulyses Jarred M.D.   On: 02/09/2018 22:25   Dg Chest Portable 1 View  Result Date: 02/09/2018 CLINICAL DATA:  Chest pain and shortness of breath for 2 days EXAM: PORTABLE CHEST 1 VIEW COMPARISON:  04/08/2015 FINDINGS: Cardiac shadow is enlarged but stable. The lungs are well aerated bilaterally. No focal infiltrate or sizable effusion is seen. Degenerative changes of the thoracic spine are noted. IMPRESSION: No acute abnormality noted. Electronically Signed   By: Inez Catalina M.D.   On: 02/09/2018 17:00        Scheduled Meds: . allopurinol  100 mg Oral Daily  . aspirin  325 mg Oral Daily  . metoprolol tartrate  25 mg Oral BID   Continuous Infusions: . diltiazem (CARDIZEM) infusion 7.5 mg/hr (02/10/18 0531)  . heparin 900 Units/hr (02/10/18 0330)     LOS: 1 day    Time spent: 35 minutes,     Belkys A Regalado, MD [facility]   If 7PM-7AM, please contact night-coverage [email or web address] Password [facility] 02/10/2018, 8:33 AM

## 2018-02-10 NOTE — Progress Notes (Signed)
ANTICOAGULATION CONSULT NOTE - Initial Consult  Pharmacy Consult for Heparin Indication: atrial fibrillation  Allergies  Allergen Reactions  . Prednisone Other (See Comments)    hallucinations    Patient Measurements: Height: 5' (152.4 cm) Weight: 162 lb 4.1 oz (73.6 kg) IBW/kg (Calculated) : 45.5 Heparin Dosing Weight:   Vital Signs: Temp: 99.1 F (37.3 C) (01/14 1708) Temp Source: Rectal (01/14 1708) BP: 114/73 (01/15 0145) Pulse Rate: 96 (01/15 0145)  Labs: Recent Labs    02/09/18 1633 02/09/18 1637 02/09/18 1642  HGB  --  14.6 14.0  HCT  --  43.0 44.4  PLT  --   --  279  CREATININE  --  1.90* 2.02*  TROPONINI 0.79*  --   --     Estimated Creatinine Clearance: 17.2 mL/min (A) (by C-G formula based on SCr of 2.02 mg/dL (H)).   Medical History: Past Medical History:  Diagnosis Date  . Arthritis   . Cancer [facility])    breast - left  . Chicken pox   . Chronic kidney disease   . GERD (gastroesophageal reflux disease)   . Hepatitis    jaundice  . Hypertension   . Menopause     Medications:  Infusions:  . diltiazem (CARDIZEM) infusion Stopped (02/09/18 2250)  . heparin      Assessment: Patient with afib.  No oral anticoagulants noted on med rec.   Goal of Therapy:  Heparin level 0.3-0.7 units/ml Monitor platelets by anticoagulation protocol: Yes   Plan:  Heparin bolus 2000 units iv x1 Heparin drip at 900  units/hr Daily CBC Next heparin level at [place], [place] 02/10/2018,3:06 AM

## 2018-02-10 NOTE — ED Notes (Signed)
Pt agitated and trying to get OOB. Biting on mittens with intention to remove them. Pt being redirected but continues to bite on and try to pull off mittens.

## 2018-02-10 NOTE — Progress Notes (Signed)
ABIs and TBIs completed - Preliminary results found in Chart review under CV Proc. [place] Myrka Sylva,RVS 02/10/2018, 5:24 PM

## 2018-02-10 NOTE — ED Notes (Addendum)
Reordered Heparin level due to pharmacy consultation on whether the first level is accurate due to only being able to draw Heparin level from one limb. This RN will attempt to redraw heparin level before patient is transferred to [facility].

## 2018-02-11 ENCOUNTER — Other Ambulatory Visit: Payer: Self-pay

## 2018-02-11 LAB — COMPREHENSIVE METABOLIC PANEL
ALT: 99 U/L — ABNORMAL HIGH (ref 0–44)
AST: 65 U/L — ABNORMAL HIGH (ref 15–41)
Albumin: 3.3 g/dL — ABNORMAL LOW (ref 3.5–5.0)
Alkaline Phosphatase: 82 U/L (ref 38–126)
Anion gap: 12 (ref 5–15)
BUN: 57 mg/dL — ABNORMAL HIGH (ref 8–23)
CHLORIDE: 112 mmol/L — AB (ref 98–111)
CO2: 15 mmol/L — ABNORMAL LOW (ref 22–32)
Calcium: 8.9 mg/dL (ref 8.9–10.3)
Creatinine, Ser: 1.81 mg/dL — ABNORMAL HIGH (ref 0.44–1.00)
GFR, EST AFRICAN AMERICAN: 28 mL/min — AB (ref >60)
GFR, EST NON AFRICAN AMERICAN: 25 mL/min — AB (ref >60)
Glucose, Bld: 128 mg/dL — ABNORMAL HIGH (ref 70–99)
Potassium: 5 mmol/L (ref 3.5–5.1)
Sodium: 139 mmol/L (ref 135–145)
Total Bilirubin: 1 mg/dL (ref 0.3–1.2)
Total Protein: 5.8 g/dL — ABNORMAL LOW (ref 6.5–8.1)

## 2018-02-11 LAB — CBC
HCT: 40.1 % (ref 36.0–46.0)
Hemoglobin: 12.7 g/dL (ref 12.0–15.0)
MCH: 31.1 pg (ref 26.0–34.0)
MCHC: 31.7 g/dL (ref 30.0–36.0)
MCV: 98 fL (ref 80.0–100.0)
NRBC: 0.6 % — AB (ref 0.0–0.2)
Platelets: 243 10*3/uL (ref 150–400)
RBC: 4.09 MIL/uL (ref 3.87–5.11)
RDW: 14.8 % (ref 11.5–15.5)
WBC: 12.4 10*3/uL — AB (ref 4.0–10.5)

## 2018-02-11 LAB — T3, FREE: T3, Free: 2.1 pg/mL (ref 2.0–4.4)

## 2018-02-11 LAB — LIPID PANEL
Cholesterol: 129 mg/dL (ref 0–200)
HDL: 37 mg/dL — ABNORMAL LOW (ref >40)
LDL CALC: 75 mg/dL (ref 0–99)
TRIGLYCERIDES: 84 mg/dL (ref <150)
Total CHOL/HDL Ratio: 3.5 RATIO
VLDL: 17 mg/dL (ref 0–40)

## 2018-02-11 LAB — HEMOGLOBIN A1C
Hgb A1c MFr Bld: 6.1 % — ABNORMAL HIGH (ref 4.8–5.6)
Mean Plasma Glucose: 128.37 mg/dL

## 2018-02-11 LAB — HEPARIN LEVEL (UNFRACTIONATED)
HEPARIN UNFRACTIONATED: 0.52 [IU]/mL (ref 0.30–0.70)
Heparin Unfractionated: 0.55 IU/mL (ref 0.30–0.70)

## 2018-02-11 MED ORDER — ASPIRIN 81 MG PO CHEW
81.0000 mg | CHEWABLE_TABLET | Freq: Once | ORAL | Status: AC
  Administered 2018-02-11: 81 mg via ORAL
  Filled 2018-02-11: qty 1

## 2018-02-11 MED ORDER — METOPROLOL TARTRATE 50 MG PO TABS
50.0000 mg | ORAL_TABLET | Freq: Two times a day (BID) | ORAL | Status: DC
  Administered 2018-02-11 (×2): 50 mg via ORAL
  Filled 2018-02-11 (×2): qty 1

## 2018-02-11 MED ORDER — ISOSORB DINITRATE-HYDRALAZINE 20-37.5 MG PO TABS
0.5000 | ORAL_TABLET | Freq: Three times a day (TID) | ORAL | Status: DC
  Administered 2018-02-11 (×2): 0.5 via ORAL
  Filled 2018-02-11 (×2): qty 1

## 2018-02-11 NOTE — Progress Notes (Signed)
Progress Note  Patient Name: Kendra Bray Date of Encounter: 02/11/2018  Primary Cardiologist: Sanda Klein, MD   Subjective   Very sleepy.  Opens eyes to voice, but does not interact. Rhythm appears to be atrial flutter with 4: 1 AV block and consistently heart rate of 65 bpm.  Inpatient Medications    Scheduled Meds: . allopurinol  100 mg Oral Daily  . aspirin  325 mg Oral Daily  . metoprolol tartrate  25 mg Oral BID   Continuous Infusions: . diltiazem (CARDIZEM) infusion 5 mg/hr (02/10/18 1712)  . heparin 750 Units/hr (02/11/18 0739)   PRN Meds: acetaminophen **OR** acetaminophen, ondansetron **OR** ondansetron (ZOFRAN) IV   Vital Signs    Vitals:   02/10/18 2222 02/11/18 0008 02/11/18 0558 02/11/18 0755  BP: 122/72 101/72 110/64 110/67  Pulse:  66 65 66  Resp: (!) 29 20 20  (!) 22  Temp:  97.6 F (36.4 C) 98.1 F (36.7 C)   TempSrc:  Oral Oral   SpO2:  98% 97%   Weight:   73 kg   Height:        Intake/Output Summary (Last 24 hours) at 02/11/2018 0906 Last data filed at 02/11/2018 0558 Gross per 24 hour  Intake 522.99 ml  Output 300 ml  Net 222.99 ml   Last 3 Weights 02/11/2018 02/10/2018 02/10/2018  Weight (lbs) 160 lb 15 oz 160 lb 8 oz 162 lb 4.1 oz  Weight (kg) 73 kg 72.802 kg 73.6 kg      Telemetry    Atrial flutter with 4: 1 AV block- Personally Reviewed  ECG    Probable atrial fibrillation, cannot exclude atrial flutter with rapid ventricular response, left anterior fascicular block - Personally Reviewed  Physical Exam  Asleep, appears comfortable, lying fully supine in bed without respiratory difficulty GEN: No acute distress.   Neck: No JVD Cardiac: RRR, no murmurs, rubs, or gallops.  Respiratory: Clear to auscultation bilaterally. GI: Soft, nontender, non-distended  MS: No edema; No deformity. Neuro:   Unable to evaluate Psych:  Unable to evaluate  Labs    Chemistry Recent Labs  Lab 02/09/18 1642 02/10/18 0917  02/11/18 0500  NA 140 139 139  K 4.3 4.3 5.0  CL 108 110 112*  CO2 19* 18* 15*  GLUCOSE 147* 138* 128*  BUN 54* 55* 57*  CREATININE 2.02* 1.77* 1.81*  CALCIUM 10.3 9.4 8.9  PROT 6.8  --  5.8*  ALBUMIN 4.1  --  3.3*  AST 41  --  65*  ALT 56*  --  99*  ALKPHOS 80  --  82  BILITOT 1.1  --  1.0  GFRNONAA 21* 25* 25*  GFRAA 25* 29* 28*  ANIONGAP 13 11 12      Hematology Recent Labs  Lab 02/09/18 1642 02/10/18 0830 02/11/18 0500  WBC 12.9* 11.8* 12.4*  RBC 4.40 3.86* 4.09  HGB 14.0 12.3 12.7  HCT 44.4 40.0 40.1  MCV 100.9* 103.6* 98.0  MCH 31.8 31.9 31.1  MCHC 31.5 30.8 31.7  RDW 14.7 14.9 14.8  PLT 279 218 243    Cardiac Enzymes Recent Labs  Lab 02/09/18 1633 02/10/18 0830 02/10/18 1147  TROPONINI 0.79* 0.59* 0.56*   No results for input(s): TROPIPOC in the last 168 hours.   BNPNo results for input(s): BNP, PROBNP in the last 168 hours.   DDimer No results for input(s): DDIMER in the last 168 hours.   Radiology    Ct Head Wo Contrast  Result Date:  02/09/2018 CLINICAL DATA:  85 year old female with unexplained altered mental status. Visual hallucinations, confusion and shortness of breath for 2 days. EXAM: CT HEAD WITHOUT CONTRAST TECHNIQUE: Contiguous axial images were obtained from the base of the skull through the vertex without intravenous contrast. COMPARISON:  None. FINDINGS: Brain: Chronic encephalomalacia in the right inferior frontal gyrus with regional white matter gliosis. Patchy and confluent cerebral white matter hypodensity elsewhere. No midline shift, ventriculomegaly, mass effect, evidence of mass lesion, intracranial hemorrhage or evidence of cortically based acute infarction. No other cortical encephalomalacia. Vascular: Calcified atherosclerosis at the skull base. No suspicious intracranial vascular hyperdensity. Skull: Negative. Sinuses/Orbits: Visualized paranasal sinuses and mastoids are well pneumatized. Other: Negative visible orbits aside from  postoperative changes. Trace intravenous gas about the right temporalis muscle, probably related to IV access. Other visualized scalp soft tissues are within normal limits. IMPRESSION: 1. Chronic right inferior frontal encephalomalacia is probably due to a prior anterior division right MCA infarct. 2. Moderate superimposed cerebral white matter changes, most commonly due to chronic small vessel disease. 3. No acute intracranial abnormality. Electronically Signed   By: Genevie Ann M.D.   On: 02/09/2018 18:25   Mr Brain Wo Contrast  Result Date: 02/09/2018 CLINICAL DATA:  Altered mental status EXAM: MRI HEAD WITHOUT CONTRAST TECHNIQUE: Multiplanar, multiecho pulse sequences of the brain and surrounding structures were obtained without intravenous contrast. COMPARISON:  Head CT 02/09/2018 FINDINGS: BRAIN: There is no acute infarct, acute hemorrhage, hydrocephalus or extra-axial collection. The midline structures are normal. No midline shift or other mass effect. Old right frontal lobe infarct encephalomalacia. Diffuse confluent hyperintense T2-weighted signal within the periventricular, deep and juxtacortical white matter, most commonly due to chronic ischemic microangiopathy. Generalized atrophy without lobar predilection. Susceptibility-sensitive sequences show no chronic microhemorrhage or superficial siderosis. VASCULAR: Major intracranial arterial and venous sinus flow voids are normal. SKULL AND UPPER CERVICAL SPINE: Calvarial bone marrow signal is normal. There is no skull base mass. Visualized upper cervical spine and soft tissues are normal. SINUSES/ORBITS: No fluid levels or advanced mucosal thickening. No mastoid or middle ear effusion. The orbits are normal. IMPRESSION: 1. No acute intracranial abnormality. 2. Chronic ischemic microangiopathy and generalized advanced atrophy. 3. Old right frontal lobe infarct associated encephalomalacia. Electronically Signed   By: Ulyses Jarred M.D.   On: 02/09/2018 22:25     Dg Chest Portable 1 View  Result Date: 02/09/2018 CLINICAL DATA:  Chest pain and shortness of breath for 2 days EXAM: PORTABLE CHEST 1 VIEW COMPARISON:  04/08/2015 FINDINGS: Cardiac shadow is enlarged but stable. The lungs are well aerated bilaterally. No focal infiltrate or sizable effusion is seen. Degenerative changes of the thoracic spine are noted. IMPRESSION: No acute abnormality noted. Electronically Signed   By: Inez Catalina M.D.   On: 02/09/2018 17:00   Vas [place] Burnard Bunting With/wo Tbi  Result Date: 02/10/2018 LOWER EXTREMITY DOPPLER STUDY Indications: Peripheral artery disease.  Comparison Study: No studies for comparison Performing Technologist: Birdena Crandall, [place] [facility]  Examination Guidelines: A complete evaluation includes at minimum, Doppler waveform signals and systolic blood pressure reading at the level of bilateral brachial, anterior tibial, and posterior tibial arteries, when vessel segments are accessible. Bilateral testing is considered an integral part of a complete examination. Photoelectric Plethysmograph (PPG) waveforms and toe systolic pressure readings are included as required and additional duplex testing as needed. Limited examinations for reoccurring indications may be performed as noted.  ABI Findings: +---------+------------------+-----+---------+--------+ Right    Rt Pressure (mmHg)IndexWaveform Comment  +---------+------------------+-----+---------+--------+ Brachial  triphasicIV site  +---------+------------------+-----+---------+--------+ PTA      130               1.18 triphasic         +---------+------------------+-----+---------+--------+ DP       103               0.94 triphasic         +---------+------------------+-----+---------+--------+ Great Toe104               0.95                   +---------+------------------+-----+---------+--------+ +---------+------------------+-----+--------+-------+ Left     Lt Pressure  (mmHg)IndexWaveformComment +---------+------------------+-----+--------+-------+ Brachial 110                                    +---------+------------------+-----+--------+-------+ PTA      139               1.26                 +---------+------------------+-----+--------+-------+ DP       137               1.25                 +---------+------------------+-----+--------+-------+ Great Toe77                0.70                 +---------+------------------+-----+--------+-------+ +-------+-----------+-----------+------------+------------+ ABI/TBIToday's ABIToday's TBIPrevious ABIPrevious TBI +-------+-----------+-----------+------------+------------+ Right  1.18       0.95                                +-------+-----------+-----------+------------+------------+ Left   1.26       0.70                                +-------+-----------+-----------+------------+------------+  Summary: Right: Resting right ankle-brachial index is within normal range. No evidence of significant right lower extremity arterial disease. The right toe-brachial index is normal. Left: Resting left ankle-brachial index is within normal range. No evidence of significant left lower extremity arterial disease. The left toe-brachial index is normal.  *See table(s) above for measurements and observations.  Electronically signed by Curt Jews MD on 02/10/2018 at 8:03:13 PM.   Final    Vas [place] Lower Extremity Arterial Duplex  Result Date: 02/10/2018 LOWER EXTREMITY ARTERIAL DUPLEX STUDY  Current ABI: Rt 1.18 Lt 1.26  Examination Guidelines: A complete evaluation includes B-mode imaging, spectral Doppler, color Doppler, and power Doppler as needed of all accessible portions of each vessel. Bilateral testing is considered an integral part of a complete examination. Limited examinations for reoccurring indications may be performed as noted.  Right Duplex Findings:  +-----------+--------+-----+--------+---------+--------+            PSV cm/sRatioStenosisWaveform Comments +-----------+--------+-----+--------+---------+--------+ CFA Distal 56                   triphasic         +-----------+--------+-----+--------+---------+--------+ DFA        50                   triphasic         +-----------+--------+-----+--------+---------+--------+ SFA Prox   88  triphasic         +-----------+--------+-----+--------+---------+--------+ SFA Mid    44                   triphasic         +-----------+--------+-----+--------+---------+--------+ SFA Distal 43                   triphasic         +-----------+--------+-----+--------+---------+--------+ POP Prox   39                   triphasic         +-----------+--------+-----+--------+---------+--------+ ATA Distal 45                   triphasic         +-----------+--------+-----+--------+---------+--------+ PTA Prox   50                   triphasic         +-----------+--------+-----+--------+---------+--------+ PTA Distal 43                   triphasic         +-----------+--------+-----+--------+---------+--------+ PERO Distal26                   triphasic         +-----------+--------+-----+--------+---------+--------+  Summary: Right: Normal examination. No evidence of arterial occlusive disease.  See table(s) above for measurements and observations. Electronically signed by Curt Jews MD on 02/10/2018 at 8:04:03 PM.    Final     Cardiac Studies   ECHO 02/11/2018 - Left ventricle: The cavity size was moderately dilated. Wall   thickness was increased in a pattern of mild LVH. Systolic   function was severely reduced. The estimated ejection fraction   was in the range of 15% to 20%. Diffuse hypokinesis. - Mitral valve: Mildly calcified annulus. Mildly thickened, mildly   calcified leaflets . There was moderate regurgitation. - Left  atrium: The atrium was moderately dilated. - Right ventricle: The cavity size was moderately dilated. Systolic   function was moderately reduced. - Tricuspid valve: There was mild-moderate regurgitation. - Pulmonary arteries: Systolic pressure was moderately increased.   PA peak pressure: 51 mm Hg (S).  Patient Profile     85 y.o. female presenting with altered mental status, found to have atrial fibrillation with rapid ventricular response and severely depressed left ventricular systolic function without overt heart failure, evidence of old stroke on brain MRI, and a background of systemic hypertension, CKD stage III-4, remote history of mastectomy for breast cancer.  Assessment & Plan    1. CMP: Severely depressed left ventricular systolic function with a global pattern.  It is quite possible that she has tachycardia-related cardiomyopathy, but cannot exclude multivessel CAD in this 85 year old woman.  No reports of angina, but no reliable history can be obtained.  No overt hypervolemia on physical exam.  Able to lie flat.  This point he does not need diuretics.  Has fairly advanced chronic kidney disease.  Will start hydralazine nitrates rather than RAAS inhibitors.  Stop diltiazem, prefer to use beta-blockers for rate control and heart failure.  Troponin elevation is mild" flat" and does not signify coronary insufficiency, although it is associated with poor prognosis.  The profile is generally favorable with an LDL cholesterol of only 75 without medication.  Low HDL. 2. Atrial flutter: Now very well rate controlled on low doses of medication, suggesting that she also has  some intrinsic AV node disease, supported by the presence of left anterior fascicular block.  On intravenous heparin.  It is likely we will have to consider coronary angiography in this patient so we will leave on intravenous heparin for now.  Long-term probably a candidate for Eliquis 2.5 mg twice daily based on advanced age and  poor kidney function. CHADSVasc 7 or higher (age 71, hypertension, gender, CHF, via stroke 2, possible vascular disease).  Do's the dose of aspirin to 81 mg daily 3.  Altered mental status: Reason for this is quite unclear.  She has a large frontal stroke but this appears to be old.  It is difficult to proceed with further cardiac evaluation until we clarify the reason for this.  Family is not at the bedside today.  Her daughter, who was here yesterday and is her closest caregiver, lives in [place].     For questions or updates, please contact [place] Please consult [email or web address] for contact info under        Signed, Sanda Klein, MD  02/11/2018, 9:06 AM

## 2018-02-11 NOTE — Progress Notes (Signed)
Patient ID: Kendra Bray, female   DOB: 07-12-1927, 85 y.o.   MRN: [id number]  PROGRESS NOTE    Kendra Bray  TIR:[id number] DOB: [birth year] DOA: 02/09/2018 PCP: Dorena Cookey, MD   Brief Narrative:  Patient-year-old female with history of chronic kidney disease, breast cancer in remission, gout presented on 02/09/2018 after she was found to be confused.  She was found to have A. fib with RVR along with persistent confusion in the ED.  She was also found to have cooler right lower extremity.  She was transferred to [facility] for vascular evaluation.  Cardiology was also consulted.  She was initially started on Cardizem and heparin drips.   Assessment & Plan:   Principal Problem:   Atrial fibrillation with RVR (HCC) Active Problems:   BREAST CANCER, HX OF   Acute encephalopathy   CKD (chronic kidney disease), stage III (HCC)   Atrial fibrillation (HCC)  Paroxysmal atrial fibrillation with rapid ventricular rate -Cardiology following.  Currently rate controlled.  Initially started on Cardizem drip which has been changed to oral metoprolol.  Continue heparin drip for now.  Acute metabolic encephalopathy -MRI of the brain was negative for acute infarct. -Monitor mental status.  Fall precautions. -Vitamin B12 absorption limits.  TSH slightly elevated but normal T4 and T3. -Mental status is improving but still confused. -Chest x-ray was negative for infection, UA was negative.  Acute cardiomyopathy -Echo showed EF of 15 to 20% with diffuse hypokinesis.  Follow cardiology recommendations.  Strict input and output.  Daily weights.  Mildly elevated troponins -Most likely secondary to demand ischemia from above.  Troponins did not trend up.  Right lower extremity ischemia was ruled out -Vascular surgery evaluation appreciated.  No evidence of occlusive arterial disease on imaging.  No further work-up needed as per vascular surgery.  Chronic kidney disease stage III with  metabolic acidosis -Creatinine 1.81 today.  Prior creatinine 1.6-2 -Monitor  Leukocytosis -Probably reactive.   DVT prophylaxis: Heparin Code Status: DNR.  Confirmed with daughter at bedside Family Communication: None at bedside Disposition Plan: Depends on clinical outcome  Consultants: Cardiology/vascular surgery  Procedures:  ECHO 02/11/2018 - Left ventricle: The cavity size was moderately dilated. Wall thickness was increased in a pattern of mild LVH. Systolic function was severely reduced. The estimated ejection fraction was in the range of 15% to 20%. Diffuse hypokinesis. - Mitral valve: Mildly calcified annulus. Mildly thickened, mildly calcified leaflets . There was moderate regurgitation. - Left atrium: The atrium was moderately dilated. - Right ventricle: The cavity size was moderately dilated. Systolic function was moderately reduced. - Tricuspid valve: There was mild-moderate regurgitation. - Pulmonary arteries: Systolic pressure was moderately increased. PA peak pressure: 51 mm Hg (S).  Antimicrobials: None   Subjective: Patient seen and examined at bedside.  She is awake and answers some questions but is pleasantly confused.  Daughter at bedside thinks that her mental status has improved slightly but is not at baseline yet.  No overnight fever or vomiting.  No worsening shortness of breath reported. Objective: Vitals:   02/11/18 0008 02/11/18 0558 02/11/18 0755 02/11/18 1041  BP: 101/72 110/64 110/67 111/72  Pulse: 66 65 66   Resp: 20 20 (!) 22   Temp: 97.6 F (36.4 C) 98.1 F (36.7 C)    TempSrc: Oral Oral    SpO2: 98% 97%    Weight:  73 kg    Height:        Intake/Output Summary (Last 24 hours) at  02/11/2018 1130 Last data filed at 02/11/2018 0558 Gross per 24 hour  Intake 522.99 ml  Output 300 ml  Net 222.99 ml   Filed Weights   02/10/18 0300 02/10/18 1504 02/11/18 0558  Weight: 73.6 kg 72.8 kg 73 kg    Examination:  General  exam: Elderly female lying in bed.  No distress.  Awake but pleasantly confused respiratory system: Bilateral decreased breath sounds at bases with some scattered crackles, no wheezing Cardiovascular system: S1 & S2 heard, Rate controlled Gastrointestinal system: Abdomen is nondistended, soft and nontender. Normal bowel sounds heard. Extremities: No cyanosis, clubbing; trace edema Central nervous system: Awake, pleasantly confused.  No focal neurological deficits.  Moving extremities Skin: No rashes, lesions or ulcers Psychiatry: Unable to evaluate because of mental status    Data Reviewed: I have personally reviewed following labs and imaging studies  CBC: Recent Labs  Lab 02/09/18 1637 02/09/18 1642 02/10/18 0830 02/11/18 0500  WBC  --  12.9* 11.8* 12.4*  HGB 14.6 14.0 12.3 12.7  HCT 43.0 44.4 40.0 40.1  MCV  --  100.9* 103.6* 98.0  PLT  --  279 218 470   Basic Metabolic Panel: Recent Labs  Lab 02/09/18 1633 02/09/18 1637 02/09/18 1642 02/10/18 0917 02/11/18 0500  NA  --  138 140 139 139  K  --  4.3 4.3 4.3 5.0  CL  --  111 108 110 112*  CO2  --   --  19* 18* 15*  GLUCOSE  --  148* 147* 138* 128*  BUN  --  46* 54* 55* 57*  CREATININE  --  1.90* 2.02* 1.77* 1.81*  CALCIUM  --   --  10.3 9.4 8.9  MG 2.4  --   --   --   --    GFR: Estimated Creatinine Clearance: 19.2 mL/min (A) (by C-G formula based on SCr of 1.81 mg/dL (H)). Liver Function Tests: Recent Labs  Lab 02/09/18 1642 02/11/18 0500  AST 41 65*  ALT 56* 99*  ALKPHOS 80 82  BILITOT 1.1 1.0  PROT 6.8 5.8*  ALBUMIN 4.1 3.3*   No results for input(s): LIPASE, AMYLASE in the last 168 hours. Recent Labs  Lab 02/10/18 1030  AMMONIA 27   Coagulation Profile: No results for input(s): INR, PROTIME in the last 168 hours. Cardiac Enzymes: Recent Labs  Lab 02/09/18 1633 02/10/18 0830 02/10/18 1147  TROPONINI 0.79* 0.59* 0.56*   BNP (last 3 results) No results for input(s): PROBNP in the last 8760  hours. HbA1C: Recent Labs    02/11/18 0500  HGBA1C 6.1*   CBG: Recent Labs  Lab 02/09/18 1702  GLUCAP 138*   Lipid Profile: Recent Labs    02/11/18 0500  CHOL 129  HDL 37*  LDLCALC 75  TRIG 84  CHOLHDL 3.5   Thyroid Function Tests: Recent Labs    02/09/18 1633 02/10/18 0917  TSH 4.678*  --   FREET4  --  1.24  T3FREE  --  2.1   Anemia Panel: Recent Labs    02/10/18 0917  VITAMINB12 2,568*   Sepsis Labs: Recent Labs  Lab 02/09/18 1637 02/09/18 2038  LATICACIDVEN 2.46* 1.80    Recent Results (from the past 240 hour(s))  Blood culture (routine x 2)     Status: None (Preliminary result)   Collection Time: 02/09/18  4:38 PM  Result Value Ref Range Status   Specimen Description   Final    BLOOD RIGHT HAND Performed at [facility],  [place] [address] [place], [place] [address]    Special Requests   Final    BOTTLES DRAWN AEROBIC AND ANAEROBIC Blood Culture results may not be optimal due to an inadequate volume of blood received in culture bottles Performed at [facility] [address], [place], [place] [address]    Culture   Final    NO GROWTH 1 DAY Performed at [facility], [place] [address] [place], [place] [address]    Report Status PENDING  Incomplete  Blood culture (routine x 2)     Status: None (Preliminary result)   Collection Time: 02/09/18  4:42 PM  Result Value Ref Range Status   Specimen Description   Final    BLOOD RIGHT ANTECUBITAL Performed at [facility] [address] [place], [place] [address]    Special Requests   Final    BOTTLES DRAWN AEROBIC AND ANAEROBIC Blood Culture results may not be optimal due to an excessive volume of blood received in culture bottles Performed at [facility] [address] [place], [place] [address]    Culture   Final    NO GROWTH 2 DAYS Performed at [facility] [address], [place], [place] [address]     Report Status PENDING  Incomplete         Radiology Studies: Ct Head Wo Contrast  Result Date: 02/09/2018 CLINICAL DATA:  85 year old female with unexplained altered mental status. Visual hallucinations, confusion and shortness of breath for 2 days. EXAM: CT HEAD WITHOUT CONTRAST TECHNIQUE: Contiguous axial images were obtained from the base of the skull through the vertex without intravenous contrast. COMPARISON:  None. FINDINGS: Brain: Chronic encephalomalacia in the right inferior frontal gyrus with regional white matter gliosis. Patchy and confluent cerebral white matter hypodensity elsewhere. No midline shift, ventriculomegaly, mass effect, evidence of mass lesion, intracranial hemorrhage or evidence of cortically based acute infarction. No other cortical encephalomalacia. Vascular: Calcified atherosclerosis at the skull base. No suspicious intracranial vascular hyperdensity. Skull: Negative. Sinuses/Orbits: Visualized paranasal sinuses and mastoids are well pneumatized. Other: Negative visible orbits aside from postoperative changes. Trace intravenous gas about the right temporalis muscle, probably related to IV access. Other visualized scalp soft tissues are within normal limits. IMPRESSION: 1. Chronic right inferior frontal encephalomalacia is probably due to a prior anterior division right MCA infarct. 2. Moderate superimposed cerebral white matter changes, most commonly due to chronic small vessel disease. 3. No acute intracranial abnormality. Electronically Signed   By: Genevie Ann M.D.   On: 02/09/2018 18:25   Mr Brain Wo Contrast  Result Date: 02/09/2018 CLINICAL DATA:  Altered mental status EXAM: MRI HEAD WITHOUT CONTRAST TECHNIQUE: Multiplanar, multiecho pulse sequences of the brain and surrounding structures were obtained without intravenous contrast. COMPARISON:  Head CT 02/09/2018 FINDINGS: BRAIN: There is no acute infarct, acute hemorrhage, hydrocephalus or extra-axial collection. The  midline structures are normal. No midline shift or other mass effect. Old right frontal lobe infarct encephalomalacia. Diffuse confluent hyperintense T2-weighted signal within the periventricular, deep and juxtacortical white matter, most commonly due to chronic ischemic microangiopathy. Generalized atrophy without lobar predilection. Susceptibility-sensitive sequences show no chronic microhemorrhage or superficial siderosis. VASCULAR: Major intracranial arterial and venous sinus flow voids are normal. SKULL AND UPPER CERVICAL SPINE: Calvarial bone marrow signal is normal. There is no skull base mass. Visualized upper cervical spine and soft tissues are normal. SINUSES/ORBITS: No fluid levels or advanced mucosal thickening. No mastoid or middle ear effusion.  The orbits are normal. IMPRESSION: 1. No acute intracranial abnormality. 2. Chronic ischemic microangiopathy and generalized advanced atrophy. 3. Old right frontal lobe infarct associated encephalomalacia. Electronically Signed   By: Ulyses Jarred M.D.   On: 02/09/2018 22:25   Dg Chest Portable 1 View  Result Date: 02/09/2018 CLINICAL DATA:  Chest pain and shortness of breath for 2 days EXAM: PORTABLE CHEST 1 VIEW COMPARISON:  04/08/2015 FINDINGS: Cardiac shadow is enlarged but stable. The lungs are well aerated bilaterally. No focal infiltrate or sizable effusion is seen. Degenerative changes of the thoracic spine are noted. IMPRESSION: No acute abnormality noted. Electronically Signed   By: Inez Catalina M.D.   On: 02/09/2018 17:00   Vas [place] Burnard Bunting With/wo Tbi  Result Date: 02/10/2018 LOWER EXTREMITY DOPPLER STUDY Indications: Peripheral artery disease.  Comparison Study: No studies for comparison Performing Technologist: Birdena Crandall, [place] [facility]  Examination Guidelines: A complete evaluation includes at minimum, Doppler waveform signals and systolic blood pressure reading at the level of bilateral brachial, anterior tibial, and posterior tibial arteries,  when vessel segments are accessible. Bilateral testing is considered an integral part of a complete examination. Photoelectric Plethysmograph (PPG) waveforms and toe systolic pressure readings are included as required and additional duplex testing as needed. Limited examinations for reoccurring indications may be performed as noted.  ABI Findings: +---------+------------------+-----+---------+--------+ Right    Rt Pressure (mmHg)IndexWaveform Comment  +---------+------------------+-----+---------+--------+ Brachial                        triphasicIV site  +---------+------------------+-----+---------+--------+ PTA      130               1.18 triphasic         +---------+------------------+-----+---------+--------+ DP       103               0.94 triphasic         +---------+------------------+-----+---------+--------+ Great Toe104               0.95                   +---------+------------------+-----+---------+--------+ +---------+------------------+-----+--------+-------+ Left     Lt Pressure (mmHg)IndexWaveformComment +---------+------------------+-----+--------+-------+ Brachial 110                                    +---------+------------------+-----+--------+-------+ PTA      139               1.26                 +---------+------------------+-----+--------+-------+ DP       137               1.25                 +---------+------------------+-----+--------+-------+ Great Toe77                0.70                 +---------+------------------+-----+--------+-------+ +-------+-----------+-----------+------------+------------+ ABI/TBIToday's ABIToday's TBIPrevious ABIPrevious TBI +-------+-----------+-----------+------------+------------+ Right  1.18       0.95                                +-------+-----------+-----------+------------+------------+ Left   1.26       0.70                                 +-------+-----------+-----------+------------+------------+  Summary: Right: Resting right ankle-brachial index is within normal range. No evidence of significant right lower extremity arterial disease. The right toe-brachial index is normal. Left: Resting left ankle-brachial index is within normal range. No evidence of significant left lower extremity arterial disease. The left toe-brachial index is normal.  *See table(s) above for measurements and observations.  Electronically signed by Curt Jews MD on 02/10/2018 at 8:03:13 PM.   Final    Vas [place] Lower Extremity Arterial Duplex  Result Date: 02/10/2018 LOWER EXTREMITY ARTERIAL DUPLEX STUDY  Current ABI: Rt 1.18 Lt 1.26  Examination Guidelines: A complete evaluation includes B-mode imaging, spectral Doppler, color Doppler, and power Doppler as needed of all accessible portions of each vessel. Bilateral testing is considered an integral part of a complete examination. Limited examinations for reoccurring indications may be performed as noted.  Right Duplex Findings: +-----------+--------+-----+--------+---------+--------+            PSV cm/sRatioStenosisWaveform Comments +-----------+--------+-----+--------+---------+--------+ CFA Distal 56                   triphasic         +-----------+--------+-----+--------+---------+--------+ DFA        50                   triphasic         +-----------+--------+-----+--------+---------+--------+ SFA Prox   88                   triphasic         +-----------+--------+-----+--------+---------+--------+ SFA Mid    44                   triphasic         +-----------+--------+-----+--------+---------+--------+ SFA Distal 43                   triphasic         +-----------+--------+-----+--------+---------+--------+ POP Prox   39                   triphasic         +-----------+--------+-----+--------+---------+--------+ ATA Distal 45                   triphasic          +-----------+--------+-----+--------+---------+--------+ PTA Prox   50                   triphasic         +-----------+--------+-----+--------+---------+--------+ PTA Distal 43                   triphasic         +-----------+--------+-----+--------+---------+--------+ PERO Distal26                   triphasic         +-----------+--------+-----+--------+---------+--------+  Summary: Right: Normal examination. No evidence of arterial occlusive disease.  See table(s) above for measurements and observations. Electronically signed by Curt Jews MD on 02/10/2018 at 8:04:03 PM.    Final         Scheduled Meds: . allopurinol  100 mg Oral Daily  . isosorbide-hydrALAZINE  0.5 tablet Oral TID  . metoprolol tartrate  50 mg Oral BID   Continuous Infusions: . heparin 750 Units/hr (02/11/18 0739)     LOS: 2 days        Aline August, MD [facility] Pager [phone]  If 7PM-7AM, please contact night-coverage [email or web address] Password [email or web address] 02/11/2018, 11:30 AM

## 2018-02-11 NOTE — Progress Notes (Addendum)
[facility] for heparin  Indication: chest pain/ACS  Allergies  Allergen Reactions  . Prednisone Other (See Comments)    hallucinations    Patient Measurements: Height: 5' (152.4 cm) Weight: 160 lb 15 oz (73 kg) IBW/kg (Calculated) : 45.5 Heparin Dosing Weight: 62kg  Vital Signs: Temp: 98.1 F (36.7 C) (01/16 0558) Temp Source: Oral (01/16 0558) BP: 110/67 (01/16 0755) Pulse Rate: 66 (01/16 0755)  Labs: Recent Labs    02/09/18 1633  02/09/18 1642 02/10/18 0830 02/10/18 0917 02/10/18 1147 02/10/18 1837 02/11/18 0500  HGB  --    < > 14.0 12.3  --   --   --  12.7  HCT  --    < > 44.4 40.0  --   --   --  40.1  PLT  --   --  279 218  --   --   --  243  HEPARINUNFRC  --   --   --   --   --  2.16* >2.20* 0.55  CREATININE  --    < > 2.02*  --  1.77*  --   --  1.81*  TROPONINI 0.79*  --   --  0.59*  --  0.56*  --   --    < > = values in this interval not displayed.    Estimated Creatinine Clearance: 19.2 mL/min (A) (by C-G formula based on SCr of 1.81 mg/dL (H)).   Medical History: Past Medical History:  Diagnosis Date  . Arthritis   . Breast cancer ([place])    breast - left  . Chicken pox   . CKD (chronic kidney disease), stage IV ([place])   . GERD (gastroesophageal reflux disease)   . Gout   . Hepatitis    jaundice  . Hypertension   . Menopause   . Obesity   . Stroke [facility])    a. occult stroke noted on brain imaging 01/2018.  Marland Kitchen Venous insufficiency      Assessment: 85 yo female with afib/RVR and possible ACS on heparin. Also with work-up for RLE ischemia (no occlusive disease on noninvasive studies) -SCr= 1.77 (baseline ~ 2), CrCl ~ 20 -CBC stable -heparin level at goal   Goal of Therapy:  Heparin level 0.3-0.7 units/ml Monitor platelets by anticoagulation protocol: Yes   Plan:  -No heparin changes needed -Will confirm heparin level today -Daily heparin level and CBC  Hildred Laser, PharmD Clinical  Pharmacist **Pharmacist phone directory can now be found on [email or web address] ([email or web address]).  Listed under [place].  Addendum -heparin level remains at goal   Plan -No heparin changes needed -Daily heparin level and CBC  Hildred Laser, PharmD Clinical Pharmacist **Pharmacist phone directory can now be found on [email or web address] ([email or web address]).  Listed under [place].

## 2018-02-12 LAB — PROTIME-INR
INR: 1.18
Prothrombin Time: 14.9 seconds (ref 11.4–15.2)

## 2018-02-12 LAB — CBC
HCT: 37.2 % (ref 36.0–46.0)
Hemoglobin: 11.9 g/dL — ABNORMAL LOW (ref 12.0–15.0)
MCH: 31.1 pg (ref 26.0–34.0)
MCHC: 32 g/dL (ref 30.0–36.0)
MCV: 97.1 fL (ref 80.0–100.0)
NRBC: 0.5 % — AB (ref 0.0–0.2)
PLATELETS: 248 10*3/uL (ref 150–400)
RBC: 3.83 MIL/uL — ABNORMAL LOW (ref 3.87–5.11)
RDW: 15.1 % (ref 11.5–15.5)
WBC: 12.8 10*3/uL — ABNORMAL HIGH (ref 4.0–10.5)

## 2018-02-12 LAB — COMPREHENSIVE METABOLIC PANEL
ALBUMIN: 3.2 g/dL — AB (ref 3.5–5.0)
ALT: 86 U/L — ABNORMAL HIGH (ref 0–44)
ANION GAP: 7 (ref 5–15)
AST: 41 U/L (ref 15–41)
Alkaline Phosphatase: 81 U/L (ref 38–126)
BILIRUBIN TOTAL: 0.9 mg/dL (ref 0.3–1.2)
BUN: 61 mg/dL — ABNORMAL HIGH (ref 8–23)
CALCIUM: 9.6 mg/dL (ref 8.9–10.3)
CO2: 18 mmol/L — ABNORMAL LOW (ref 22–32)
Chloride: 109 mmol/L (ref 98–111)
Creatinine, Ser: 1.92 mg/dL — ABNORMAL HIGH (ref 0.44–1.00)
GFR calc Af Amer: 26 mL/min — ABNORMAL LOW (ref >60)
GFR calc non Af Amer: 23 mL/min — ABNORMAL LOW (ref >60)
Glucose, Bld: 129 mg/dL — ABNORMAL HIGH (ref 70–99)
Potassium: 4.4 mmol/L (ref 3.5–5.1)
Sodium: 134 mmol/L — ABNORMAL LOW (ref 135–145)
Total Protein: 5.4 g/dL — ABNORMAL LOW (ref 6.5–8.1)

## 2018-02-12 LAB — HEPARIN LEVEL (UNFRACTIONATED): Heparin Unfractionated: 0.52 IU/mL (ref 0.30–0.70)

## 2018-02-12 LAB — MAGNESIUM: Magnesium: 2.2 mg/dL (ref 1.7–2.4)

## 2018-02-12 MED ORDER — METOPROLOL TARTRATE 50 MG PO TABS
50.0000 mg | ORAL_TABLET | Freq: Three times a day (TID) | ORAL | Status: DC
  Administered 2018-02-12 – 2018-02-19 (×22): 50 mg via ORAL
  Filled 2018-02-12 (×22): qty 1

## 2018-02-12 MED ORDER — SODIUM CHLORIDE 0.9 % IV SOLN
INTRAVENOUS | Status: DC
  Administered 2018-02-12 (×2): via INTRAVENOUS
  Administered 2018-02-13: 125 mL/h via INTRAVENOUS
  Administered 2018-02-13: via INTRAVENOUS

## 2018-02-12 NOTE — Care Management Important Message (Signed)
Important Message  Patient Details  Name: Kendra Bray MRN: [id number] Date of Birth: [birth year]   Medicare Important Message Given:  Yes    Kendra Bray 02/12/2018, 5:01 PM

## 2018-02-12 NOTE — Progress Notes (Signed)
Patient ID: Kendra Bray, female   DOB: 02-Sep-1927, 85 y.o.   MRN: [id number]  PROGRESS NOTE    Kendra Bray  PPI:[id number] DOB: [birth year] DOA: 02/09/2018 PCP: Dorena Cookey, MD   Brief Narrative:  Patient-year-old female with history of chronic kidney disease, breast cancer in remission, gout presented on 02/09/2018 after she was found to be confused.  She was found to have A. fib with RVR along with persistent confusion in the ED.  She was also found to have cooler right lower extremity.  She was transferred to [facility] for vascular evaluation.  Cardiology was also consulted.  She was initially started on Cardizem and heparin drips.   Assessment & Plan:   Principal Problem:   Atrial fibrillation with RVR (HCC) Active Problems:   BREAST CANCER, HX OF   Acute encephalopathy   CKD (chronic kidney disease), stage III (HCC)   Atrial fibrillation (HCC)  Paroxysmal atrial fibrillation with rapid ventricular rate -Cardiology following.  Currently tachycardic.  Initially started on Cardizem drip which has been changed to oral metoprolol.  Dose has been increased by cardiology.  Continue heparin drip for now. -Might need TEE/cardioversion if rate does not improve.  Acute metabolic encephalopathy -MRI of the brain was negative for acute infarct. -Monitor mental status.  Fall precautions. -Vitamin B12 absorption limits.  TSH slightly elevated but normal T4 and T3. -Mental status is improving but still slightly confused. -Chest x-ray was negative for infection, UA was negative.  Acute cardiomyopathy -Echo showed EF of 15 to 20% with diffuse hypokinesis.  Strict input and output.  Daily weights.  Cardiology is planning to do cardiac cath once renal function is slightly stabilized.  Mildly elevated troponins -Most likely secondary to demand ischemia from above.  Troponins did not trend up.  Right lower extremity ischemia was ruled out -Vascular surgery evaluation  appreciated.  No evidence of occlusive arterial disease on imaging.  No further work-up needed as per vascular surgery.  Chronic kidney disease stage III with metabolic acidosis -Creatinine 1.92  today.  Patient has been started on IV fluids by cardiology.  Prior creatinine 1.6-2 -Monitor  Leukocytosis -Probably reactive.  Monitor   DVT prophylaxis: Heparin Code Status: DNR.   Family Communication: Daughter at bedside Disposition Plan: Depends on clinical outcome  Consultants: Cardiology/vascular surgery  Procedures:  ECHO 02/11/2018 - Left ventricle: The cavity size was moderately dilated. Wall thickness was increased in a pattern of mild LVH. Systolic function was severely reduced. The estimated ejection fraction was in the range of 15% to 20%. Diffuse hypokinesis. - Mitral valve: Mildly calcified annulus. Mildly thickened, mildly calcified leaflets . There was moderate regurgitation. - Left atrium: The atrium was moderately dilated. - Right ventricle: The cavity size was moderately dilated. Systolic function was moderately reduced. - Tricuspid valve: There was mild-moderate regurgitation. - Pulmonary arteries: Systolic pressure was moderately increased. PA peak pressure: 51 mm Hg (S).  Antimicrobials: None   Subjective: Patient seen and examined at bedside.  She is awake but pleasantly confused.  No overnight fever, nausea, vomiting or worsening shortness of breath  objective: Vitals:   02/12/18 0003 02/12/18 0024 02/12/18 0406 02/12/18 0808  BP: 100/75 (!) 98/59 92/73 (!) 122/91  Pulse: (!) 130 (!) 129 (!) 129 (!) 130  Resp: (!) 22  (!) 25 18  Temp:  (!) 97.4 F (36.3 C) (!) 97.4 F (36.3 C)   TempSrc:  Oral Oral   SpO2: 95% 94% 95% 100%  Weight:  73.3 kg   Height:        Intake/Output Summary (Last 24 hours) at 02/12/2018 1021 Last data filed at 02/12/2018 0800 Gross per 24 hour  Intake 369.45 ml  Output 400 ml  Net -30.55 ml   Filed Weights     02/10/18 1504 02/11/18 0558 02/12/18 0406  Weight: 72.8 kg 73 kg 73.3 kg    Examination:  General exam: Elderly female lying in bed.  No acute distress.  Awake but pleasantly confused  respiratory system: Bilateral decreased breath sounds at bases with some scattered basilar crackles  cardiovascular system: S1 & S2 heard, tachycardic Gastrointestinal system: Abdomen is nondistended, soft and nontender. Normal bowel sounds heard. Extremities: No cyanosis; trace edema    Data Reviewed: I have personally reviewed following labs and imaging studies  CBC: Recent Labs  Lab 02/09/18 1637 02/09/18 1642 02/10/18 0830 02/11/18 0500 02/12/18 0359  WBC  --  12.9* 11.8* 12.4* 12.8*  HGB 14.6 14.0 12.3 12.7 11.9*  HCT 43.0 44.4 40.0 40.1 37.2  MCV  --  100.9* 103.6* 98.0 97.1  PLT  --  279 218 243 308   Basic Metabolic Panel: Recent Labs  Lab 02/09/18 1633 02/09/18 1637 02/09/18 1642 02/10/18 0917 02/11/18 0500 02/12/18 0359  NA  --  138 140 139 139 134*  K  --  4.3 4.3 4.3 5.0 4.4  CL  --  111 108 110 112* 109  CO2  --   --  19* 18* 15* 18*  GLUCOSE  --  148* 147* 138* 128* 129*  BUN  --  46* 54* 55* 57* 61*  CREATININE  --  1.90* 2.02* 1.77* 1.81* 1.92*  CALCIUM  --   --  10.3 9.4 8.9 9.6  MG 2.4  --   --   --   --  2.2   GFR: Estimated Creatinine Clearance: 18.1 mL/min (A) (by C-G formula based on SCr of 1.92 mg/dL (H)). Liver Function Tests: Recent Labs  Lab 02/09/18 1642 02/11/18 0500 02/12/18 0359  AST 41 65* 41  ALT 56* 99* 86*  ALKPHOS 80 82 81  BILITOT 1.1 1.0 0.9  PROT 6.8 5.8* 5.4*  ALBUMIN 4.1 3.3* 3.2*   No results for input(s): LIPASE, AMYLASE in the last 168 hours. Recent Labs  Lab 02/10/18 1030  AMMONIA 27   Coagulation Profile: Recent Labs  Lab 02/12/18 0359  INR 1.18   Cardiac Enzymes: Recent Labs  Lab 02/09/18 1633 02/10/18 0830 02/10/18 1147  TROPONINI 0.79* 0.59* 0.56*   BNP (last 3 results) No results for input(s): PROBNP  in the last 8760 hours. HbA1C: Recent Labs    02/11/18 0500  HGBA1C 6.1*   CBG: Recent Labs  Lab 02/09/18 1702  GLUCAP 138*   Lipid Profile: Recent Labs    02/11/18 0500  CHOL 129  HDL 37*  LDLCALC 75  TRIG 84  CHOLHDL 3.5   Thyroid Function Tests: Recent Labs    02/09/18 1633 02/10/18 0917  TSH 4.678*  --   FREET4  --  1.24  T3FREE  --  2.1   Anemia Panel: Recent Labs    02/10/18 0917  VITAMINB12 2,568*   Sepsis Labs: Recent Labs  Lab 02/09/18 1637 02/09/18 2038  LATICACIDVEN 2.46* 1.80    Recent Results (from the past 240 hour(s))  Blood culture (routine x 2)     Status: None (Preliminary result)   Collection Time: 02/09/18  4:38 PM  Result Value Ref Range Status  Specimen Description   Final    BLOOD RIGHT HAND Performed at [facility] [address] [place], [place] [address]    Special Requests   Final    BOTTLES DRAWN AEROBIC AND ANAEROBIC Blood Culture results may not be optimal due to an inadequate volume of blood received in culture bottles Performed at [facility] [address] [place], [place] [address]    Culture   Final    NO GROWTH 2 DAYS Performed at [facility] [address] [place], [place] [address]    Report Status PENDING  Incomplete  Blood culture (routine x 2)     Status: None (Preliminary result)   Collection Time: 02/09/18  4:42 PM  Result Value Ref Range Status   Specimen Description   Final    BLOOD RIGHT ANTECUBITAL Performed at [facility] [address], [place], [place] [address]    Special Requests   Final    BOTTLES DRAWN AEROBIC AND ANAEROBIC Blood Culture results may not be optimal due to an excessive volume of blood received in culture bottles Performed at [facility] [address], [place], [place] [address]    Culture   Final    NO GROWTH 3 DAYS Performed at [facility], [place] [address]  [place], [place] [address]    Report Status PENDING  Incomplete         Radiology Studies: Vas [place] Abi With/wo Tbi  Result Date: 02/10/2018 LOWER EXTREMITY DOPPLER STUDY Indications: Peripheral artery disease.  Comparison Study: No studies for comparison Performing Technologist: Birdena Crandall, [place] [facility]  Examination Guidelines: A complete evaluation includes at minimum, Doppler waveform signals and systolic blood pressure reading at the level of bilateral brachial, anterior tibial, and posterior tibial arteries, when vessel segments are accessible. Bilateral testing is considered an integral part of a complete examination. Photoelectric Plethysmograph (PPG) waveforms and toe systolic pressure readings are included as required and additional duplex testing as needed. Limited examinations for reoccurring indications may be performed as noted.  ABI Findings: +---------+------------------+-----+---------+--------+ Right    Rt Pressure (mmHg)IndexWaveform Comment  +---------+------------------+-----+---------+--------+ Brachial                        triphasicIV site  +---------+------------------+-----+---------+--------+ PTA      130               1.18 triphasic         +---------+------------------+-----+---------+--------+ DP       103               0.94 triphasic         +---------+------------------+-----+---------+--------+ Great Toe104               0.95                   +---------+------------------+-----+---------+--------+ +---------+------------------+-----+--------+-------+ Left     Lt Pressure (mmHg)IndexWaveformComment +---------+------------------+-----+--------+-------+ Brachial 110                                    +---------+------------------+-----+--------+-------+ PTA      139               1.26                 +---------+------------------+-----+--------+-------+ DP       137  1.25                  +---------+------------------+-----+--------+-------+ Great Toe77                0.70                 +---------+------------------+-----+--------+-------+ +-------+-----------+-----------+------------+------------+ ABI/TBIToday's ABIToday's TBIPrevious ABIPrevious TBI +-------+-----------+-----------+------------+------------+ Right  1.18       0.95                                +-------+-----------+-----------+------------+------------+ Left   1.26       0.70                                +-------+-----------+-----------+------------+------------+  Summary: Right: Resting right ankle-brachial index is within normal range. No evidence of significant right lower extremity arterial disease. The right toe-brachial index is normal. Left: Resting left ankle-brachial index is within normal range. No evidence of significant left lower extremity arterial disease. The left toe-brachial index is normal.  *See table(s) above for measurements and observations.  Electronically signed by Curt Jews MD on 02/10/2018 at 8:03:13 PM.   Final    Vas [place] Lower Extremity Arterial Duplex  Result Date: 02/10/2018 LOWER EXTREMITY ARTERIAL DUPLEX STUDY  Current ABI: Rt 1.18 Lt 1.26  Examination Guidelines: A complete evaluation includes B-mode imaging, spectral Doppler, color Doppler, and power Doppler as needed of all accessible portions of each vessel. Bilateral testing is considered an integral part of a complete examination. Limited examinations for reoccurring indications may be performed as noted.  Right Duplex Findings: +-----------+--------+-----+--------+---------+--------+            PSV cm/sRatioStenosisWaveform Comments +-----------+--------+-----+--------+---------+--------+ CFA Distal 56                   triphasic         +-----------+--------+-----+--------+---------+--------+ DFA        50                   triphasic          +-----------+--------+-----+--------+---------+--------+ SFA Prox   88                   triphasic         +-----------+--------+-----+--------+---------+--------+ SFA Mid    44                   triphasic         +-----------+--------+-----+--------+---------+--------+ SFA Distal 43                   triphasic         +-----------+--------+-----+--------+---------+--------+ POP Prox   39                   triphasic         +-----------+--------+-----+--------+---------+--------+ ATA Distal 45                   triphasic         +-----------+--------+-----+--------+---------+--------+ PTA Prox   50                   triphasic         +-----------+--------+-----+--------+---------+--------+ PTA Distal 43                   triphasic         +-----------+--------+-----+--------+---------+--------+  PERO Distal26                   triphasic         +-----------+--------+-----+--------+---------+--------+  Summary: Right: Normal examination. No evidence of arterial occlusive disease.  See table(s) above for measurements and observations. Electronically signed by Curt Jews MD on 02/10/2018 at 8:04:03 PM.    Final         Scheduled Meds: . allopurinol  100 mg Oral Daily  . metoprolol tartrate  50 mg Oral TID   Continuous Infusions: . sodium chloride 125 mL/hr at 02/12/18 0958  . heparin 750 Units/hr (02/11/18 0739)     LOS: 3 days        Aline August, MD [facility] Pager [phone]  If 7PM-7AM, please contact night-coverage [email or web address] Password [facility] 02/12/2018, 10:21 AM

## 2018-02-12 NOTE — Progress Notes (Signed)
Progress Note  Patient Name: Kendra Bray Date of Encounter: 02/12/2018  Primary Cardiologist: Sanda Klein, MD (New)  Subjective   Much more alert compared to yesterday and able to hold a conversation, but remains at least partly disoriented. She knows she is at "[facility] in [place], believes it is 2021.  Has difficulty finding words at times. Unaware of palpitations (she is in atrial flutter at 130 bpm). Describes "a little bit of shortness of breath".  No angina. "I have never had heart disease".  Inpatient Medications    Scheduled Meds: . allopurinol  100 mg Oral Daily  . metoprolol tartrate  50 mg Oral TID   Continuous Infusions: . sodium chloride    . heparin 750 Units/hr (02/11/18 0739)   PRN Meds: acetaminophen **OR** acetaminophen, ondansetron **OR** ondansetron (ZOFRAN) IV   Vital Signs    Vitals:   02/12/18 0003 02/12/18 0024 02/12/18 0406 02/12/18 0808  BP: 100/75 (!) 98/59 92/73 (!) 122/91  Pulse: (!) 130 (!) 129 (!) 129 (!) 130  Resp: (!) 22  (!) 25 18  Temp:  (!) 97.4 F (36.3 C) (!) 97.4 F (36.3 C)   TempSrc:  Oral Oral   SpO2: 95% 94% 95% 100%  Weight:   73.3 kg   Height:        Intake/Output Summary (Last 24 hours) at 02/12/2018 0847 Last data filed at 02/12/2018 0800 Gross per 24 hour  Intake 489.45 ml  Output 400 ml  Net 89.45 ml   Last 3 Weights 02/12/2018 02/11/2018 02/10/2018  Weight (lbs) 161 lb 8 oz 160 lb 15 oz 160 lb 8 oz  Weight (kg) 73.256 kg 73 kg 72.802 kg      Telemetry    Atrial flutter with 2: 1 AV block, rate 230 bpm, for a few hours overnight rate was better controlled with 3: 1 and 4: 1 AV block - Personally Reviewed  ECG    Atrial flutter with 2: 1 AV block, left axis deviation, left bundle branch block - Personally Reviewed  Physical Exam  Alert, looks comfortable, smiling GEN: No acute distress.   Neck: No JVD Cardiac: RRR, tachycardia, no murmurs, rubs, or gallops.  Respiratory: Clear to  auscultation bilaterally. GI: Soft, nontender, non-distended  MS: No edema; No deformity. Neuro:  Nonfocal  Psych: Normal affect   Labs    Chemistry Recent Labs  Lab 02/09/18 1642 02/10/18 0917 02/11/18 0500 02/12/18 0359  NA 140 139 139 134*  K 4.3 4.3 5.0 4.4  CL 108 110 112* 109  CO2 19* 18* 15* 18*  GLUCOSE 147* 138* 128* 129*  BUN 54* 55* 57* 61*  CREATININE 2.02* 1.77* 1.81* 1.92*  CALCIUM 10.3 9.4 8.9 9.6  PROT 6.8  --  5.8* 5.4*  ALBUMIN 4.1  --  3.3* 3.2*  AST 41  --  65* 41  ALT 56*  --  99* 86*  ALKPHOS 80  --  82 81  BILITOT 1.1  --  1.0 0.9  GFRNONAA 21* 25* 25* 23*  GFRAA 25* 29* 28* 26*  ANIONGAP 13 11 12 7      Hematology Recent Labs  Lab 02/10/18 0830 02/11/18 0500 02/12/18 0359  WBC 11.8* 12.4* 12.8*  RBC 3.86* 4.09 3.83*  HGB 12.3 12.7 11.9*  HCT 40.0 40.1 37.2  MCV 103.6* 98.0 97.1  MCH 31.9 31.1 31.1  MCHC 30.8 31.7 32.0  RDW 14.9 14.8 15.1  PLT 218 243 248    Cardiac Enzymes Recent Labs  Lab 02/09/18 1633 02/10/18 0830 02/10/18 1147  TROPONINI 0.79* 0.59* 0.56*   No results for input(s): TROPIPOC in the last 168 hours.   BNPNo results for input(s): BNP, PROBNP in the last 168 hours.   DDimer No results for input(s): DDIMER in the last 168 hours.   Radiology    Vas [place] Abi With/wo Tbi  Result Date: 02/10/2018 LOWER EXTREMITY DOPPLER STUDY Indications: Peripheral artery disease.  Comparison Study: No studies for comparison Performing Technologist: Birdena Crandall, [place] RVS  Examination Guidelines: A complete evaluation includes at minimum, Doppler waveform signals and systolic blood pressure reading at the level of bilateral brachial, anterior tibial, and posterior tibial arteries, when vessel segments are accessible. Bilateral testing is considered an integral part of a complete examination. Photoelectric Plethysmograph (PPG) waveforms and toe systolic pressure readings are included as required and additional duplex testing as  needed. Limited examinations for reoccurring indications may be performed as noted.  ABI Findings: +---------+------------------+-----+---------+--------+ Right    Rt Pressure (mmHg)IndexWaveform Comment  +---------+------------------+-----+---------+--------+ Brachial                        triphasicIV site  +---------+------------------+-----+---------+--------+ PTA      130               1.18 triphasic         +---------+------------------+-----+---------+--------+ DP       103               0.94 triphasic         +---------+------------------+-----+---------+--------+ Great Toe104               0.95                   +---------+------------------+-----+---------+--------+ +---------+------------------+-----+--------+-------+ Left     Lt Pressure (mmHg)IndexWaveformComment +---------+------------------+-----+--------+-------+ Brachial 110                                    +---------+------------------+-----+--------+-------+ PTA      139               1.26                 +---------+------------------+-----+--------+-------+ DP       137               1.25                 +---------+------------------+-----+--------+-------+ Great Toe77                0.70                 +---------+------------------+-----+--------+-------+ +-------+-----------+-----------+------------+------------+ ABI/TBIToday's ABIToday's TBIPrevious ABIPrevious TBI +-------+-----------+-----------+------------+------------+ Right  1.18       0.95                                +-------+-----------+-----------+------------+------------+ Left   1.26       0.70                                +-------+-----------+-----------+------------+------------+  Summary: Right: Resting right ankle-brachial index is within normal range. No evidence of significant right lower extremity arterial disease. The right toe-brachial index is normal. Left: Resting left ankle-brachial  index is within normal range. No evidence of significant left lower extremity  arterial disease. The left toe-brachial index is normal.  *See table(s) above for measurements and observations.  Electronically signed by Curt Jews MD on 02/10/2018 at 8:03:13 PM.   Final    Vas [place] Lower Extremity Arterial Duplex  Result Date: 02/10/2018 LOWER EXTREMITY ARTERIAL DUPLEX STUDY  Current ABI: Rt 1.18 Lt 1.26  Examination Guidelines: A complete evaluation includes B-mode imaging, spectral Doppler, color Doppler, and power Doppler as needed of all accessible portions of each vessel. Bilateral testing is considered an integral part of a complete examination. Limited examinations for reoccurring indications may be performed as noted.  Right Duplex Findings: +-----------+--------+-----+--------+---------+--------+            PSV cm/sRatioStenosisWaveform Comments +-----------+--------+-----+--------+---------+--------+ CFA Distal 56                   triphasic         +-----------+--------+-----+--------+---------+--------+ DFA        50                   triphasic         +-----------+--------+-----+--------+---------+--------+ SFA Prox   88                   triphasic         +-----------+--------+-----+--------+---------+--------+ SFA Mid    44                   triphasic         +-----------+--------+-----+--------+---------+--------+ SFA Distal 43                   triphasic         +-----------+--------+-----+--------+---------+--------+ POP Prox   39                   triphasic         +-----------+--------+-----+--------+---------+--------+ ATA Distal 45                   triphasic         +-----------+--------+-----+--------+---------+--------+ PTA Prox   50                   triphasic         +-----------+--------+-----+--------+---------+--------+ PTA Distal 43                   triphasic          +-----------+--------+-----+--------+---------+--------+ PERO Distal26                   triphasic         +-----------+--------+-----+--------+---------+--------+  Summary: Right: Normal examination. No evidence of arterial occlusive disease.  See table(s) above for measurements and observations. Electronically signed by Curt Jews MD on 02/10/2018 at 8:04:03 PM.    Final     Cardiac Studies    - Left ventricle: The cavity size was moderately dilated. Wall   thickness was increased in a pattern of mild LVH. Systolic   function was severely reduced. The estimated ejection fraction   was in the range of 15% to 20%. Diffuse hypokinesis. - Mitral valve: Mildly calcified annulus. Mildly thickened, mildly   calcified leaflets . There was moderate regurgitation. - Left atrium: The atrium was moderately dilated. - Right ventricle: The cavity size was moderately dilated. Systolic   function was moderately reduced. - Tricuspid valve: There was mild-moderate regurgitation. - Pulmonary arteries: Systolic pressure was moderately increased.   PA peak pressure: 51 mm Hg (S).  Patient Profile     85 y.o. female presenting with altered mental status, found to have atrial flutter with rapid ventricular response and severely depressed left ventricular systolic function without overt heart failure, evidence of old stroke on brain MRI, and a background of systemic hypertension, CKD stage 3-4, remote history of L mastectomy for breast cancer.  Assessment & Plan    1. CMP: Severely depressed left ventricular systolic function with a global pattern.  It is quite possible that she has tachycardia-related cardiomyopathy, but cannot exclude multivessel CAD in this 85 year old woman.  Denies angina.   Did not tolerate hydralazine/nitrates due to hypotension.  Unable to use RAAS inhibitors due to renal insufficiency.  Rate control was satisfactory yesterday when she was asleep, but she is now persistently  tachycardic while awake.  Beta-blockers preferable to diltiazem due to depressed left ventricular systolic function.  Minor troponin elevation is " flat" and does not signify coronary insufficiency, although it is associated with poor prognosis.  The lipid profile is generally favorable with an LDL cholesterol of only 75 without medication, but with relatively low HDL. After we achieve good rate control and hopefully stabilize renal function at lower values would recommend cardiac catheterization early next week.  Would like to perform cardiac catheterization before TEE guided cardioversion next week, so that there is no interruption in anticoagulation after her cardioversion. 2. Atrial flutter:  Rate control is poor, will increase metoprolol to 50 mg 3 times daily. On intravenous heparin. On intravenous heparin for now, while we wait for stabilization of renal function and until she has coronary angiography.  Long-term probably a candidate for Eliquis 2.5 mg twice daily based on advanced age and poor kidney function. CHADSVasc 7 or higher (age 71, hypertension, gender, CHF, via stroke 2, possible vascular disease).    If we do not find CAD I would stop her aspirin. 3.  Altered mental status: Reason for this is quite unclear.  She has a large frontal stroke but this appears to be old.  Is unclear to what degree her confusion is due to this.  Until just a few days ago she was living independently, with her closest caregiver being her daughter who would call her daily from [place]. 4. CKD 3-4: BUN and creatinine have inched up a little bit and I think this is due to hypovolemia.  We will give just a little bit of IV fluids to make sure that she does not develop hypotension on the higher dose of metoprolol.     For questions or updates, please contact [place] Please consult [email or web address] for contact info under        Signed, Sanda Klein, MD  02/12/2018, 8:47 AM

## 2018-02-12 NOTE — Progress Notes (Signed)
[place] for heparin  Indication: chest pain/ACS  Allergies  Allergen Reactions  . Prednisone Other (See Comments)    hallucinations    Patient Measurements: Height: 5' (152.4 cm) Weight: 161 lb 8 oz (73.3 kg) IBW/kg (Calculated) : 45.5 Heparin Dosing Weight: 62kg  Vital Signs: Temp: 97.4 F (36.3 C) (01/17 0406) Temp Source: Oral (01/17 0406) BP: 92/73 (01/17 0406) Pulse Rate: 129 (01/17 0406)  Labs: Recent Labs    02/09/18 1633  02/10/18 0830 02/10/18 0917 02/10/18 1147  02/11/18 0500 02/11/18 1146 02/12/18 0359  HGB  --    < > 12.3  --   --   --  12.7  --  11.9*  HCT  --    < > 40.0  --   --   --  40.1  --  37.2  PLT  --    < > 218  --   --   --  243  --  248  LABPROT  --   --   --   --   --   --   --   --  14.9  INR  --   --   --   --   --   --   --   --  1.18  HEPARINUNFRC  --   --   --   --  2.16*   < > 0.55 0.52 0.52  CREATININE  --    < >  --  1.77*  --   --  1.81*  --  1.92*  TROPONINI 0.79*  --  0.59*  --  0.56*  --   --   --   --    < > = values in this interval not displayed.    Estimated Creatinine Clearance: 18.1 mL/min (A) (by C-G formula based on SCr of 1.92 mg/dL (H)).   Medical History: Past Medical History:  Diagnosis Date  . Arthritis   . Breast cancer ([place])    breast - left  . Chicken pox   . CKD (chronic kidney disease), stage IV ([place])   . GERD (gastroesophageal reflux disease)   . Gout   . Hepatitis    jaundice  . Hypertension   . Menopause   . Obesity   . Stroke [facility])    a. occult stroke noted on brain imaging 01/2018.  Marland Kitchen Venous insufficiency      Assessment: 85 yo female with afib/RVR and possible ACS on heparin. Also with work-up for RLE ischemia (no occlusive disease on noninvasive studies) -SCr= 1.92 (baseline ~ 2), CrCl ~ 20 -CBC stable -heparin level at goal   Goal of Therapy:  Heparin level 0.3-0.7 units/ml Monitor platelets by anticoagulation protocol: Yes   Plan:  -No  heparin changes needed -Daily heparin level and CBC  Alanda Slim, PharmD, [facility] Clinical Pharmacist Please see AMION for all Pharmacists' Contact Phone Numbers 02/12/2018, 7:37 AM

## 2018-02-13 DIAGNOSIS — I5043 Acute on chronic combined systolic (congestive) and diastolic (congestive) heart failure: Secondary | ICD-10-CM

## 2018-02-13 DIAGNOSIS — R7989 Other specified abnormal findings of blood chemistry: Secondary | ICD-10-CM

## 2018-02-13 DIAGNOSIS — I483 Typical atrial flutter: Secondary | ICD-10-CM

## 2018-02-13 LAB — MAGNESIUM: Magnesium: 2.3 mg/dL (ref 1.7–2.4)

## 2018-02-13 LAB — CBC
HCT: 41.2 % (ref 36.0–46.0)
Hemoglobin: 13.1 g/dL (ref 12.0–15.0)
MCH: 32.2 pg (ref 26.0–34.0)
MCHC: 31.8 g/dL (ref 30.0–36.0)
MCV: 101.2 fL — ABNORMAL HIGH (ref 80.0–100.0)
Platelets: 284 10*3/uL (ref 150–400)
RBC: 4.07 MIL/uL (ref 3.87–5.11)
RDW: 15.8 % — ABNORMAL HIGH (ref 11.5–15.5)
WBC: 11.8 10*3/uL — ABNORMAL HIGH (ref 4.0–10.5)
nRBC: 0.8 % — ABNORMAL HIGH (ref 0.0–0.2)

## 2018-02-13 LAB — HEPARIN LEVEL (UNFRACTIONATED)
Heparin Unfractionated: <0.1 IU/mL — ABNORMAL LOW (ref 0.30–0.70)
Heparin Unfractionated: 0.3 IU/mL (ref 0.30–0.70)

## 2018-02-13 LAB — BASIC METABOLIC PANEL
Anion gap: 12 (ref 5–15)
BUN: 60 mg/dL — ABNORMAL HIGH (ref 8–23)
CO2: 13 mmol/L — ABNORMAL LOW (ref 22–32)
Calcium: 9.8 mg/dL (ref 8.9–10.3)
Chloride: 111 mmol/L (ref 98–111)
Creatinine, Ser: 1.67 mg/dL — ABNORMAL HIGH (ref 0.44–1.00)
GFR calc Af Amer: 31 mL/min — ABNORMAL LOW (ref >60)
GFR calc non Af Amer: 27 mL/min — ABNORMAL LOW (ref >60)
Glucose, Bld: 163 mg/dL — ABNORMAL HIGH (ref 70–99)
POTASSIUM: 4.8 mmol/L (ref 3.5–5.1)
Sodium: 136 mmol/L (ref 135–145)

## 2018-02-13 MED ORDER — DILTIAZEM HCL 60 MG PO TABS
30.0000 mg | ORAL_TABLET | Freq: Four times a day (QID) | ORAL | Status: DC
  Administered 2018-02-13 (×3): 30 mg via ORAL
  Filled 2018-02-13 (×3): qty 1

## 2018-02-13 MED ORDER — HEPARIN BOLUS VIA INFUSION
1000.0000 [IU] | Freq: Once | INTRAVENOUS | Status: AC
  Administered 2018-02-13: 1000 [IU] via INTRAVENOUS
  Filled 2018-02-13: qty 1000

## 2018-02-13 NOTE — Progress Notes (Signed)
Kendra Bray for heparin  Indication: chest pain/ACS  Allergies  Allergen Reactions  . Prednisone Other (See Comments)    hallucinations    Patient Measurements: Height: 5' (152.4 cm) Weight: 161 lb 7.8 oz (73.2 kg) IBW/kg (Calculated) : 45.5 Heparin Dosing Weight: 62kg  Vital Signs: Temp: 98 F (36.7 C) (01/18 0417) Temp Source: Oral (01/18 0417) BP: 103/64 (01/18 1334) Pulse Rate: 57 (01/18 1334)  Labs: Recent Labs    02/11/18 0500  02/12/18 0359 02/13/18 0423 02/13/18 1414  HGB 12.7  --  11.9* 13.1  --   HCT 40.1  --  37.2 41.2  --   PLT 243  --  248 284  --   LABPROT  --   --  14.9  --   --   INR  --   --  1.18  --   --   HEPARINUNFRC 0.55   < > 0.52 <0.10* 0.30  CREATININE 1.81*  --  1.92* 1.67*  --    < > = values in this interval not displayed.    Estimated Creatinine Clearance: 20.8 mL/min (A) (by C-G formula based on SCr of 1.67 mg/dL (H)).   Medical History: Past Medical History:  Diagnosis Date  . Arthritis   . Breast cancer ([place])    breast - left  . Chicken pox   . CKD (chronic kidney disease), stage IV ([place])   . GERD (gastroesophageal reflux disease)   . Gout   . Hepatitis    jaundice  . Hypertension   . Menopause   . Obesity   . Stroke [facility])    a. occult stroke noted on brain imaging 01/2018.  Marland Kitchen Venous insufficiency      Assessment: 85 yo female with afib/RVR and possible ACS on heparin. Also with work-up for RLE ischemia (no occlusive disease on noninvasive studies) -SCr= 1.67 (baseline ~ 2), CrCl ~ 20 -CBC stable -heparin level subtherapeutic this morning at <0.1. Per RN pt lost IV access for 1hr 14 mins overnight. No bleeding reported.   Heparin level now therapeutic at 0.30 after resuming infusion and bolus. Will increase infusion rate slightly given borderline level and recheck in am.    Goal of Therapy:  Heparin level 0.3-0.7 units/ml Monitor platelets by anticoagulation protocol: Yes    Plan:  -Increase heparin to 800 units/hr -Recheck with morning labs  Arrie Senate, PharmD, BCPS Clinical Pharmacist [phone] Please check AMION for all [place] numbers 02/13/2018

## 2018-02-13 NOTE — Progress Notes (Signed)
[place] for heparin  Indication: chest pain/ACS  Allergies  Allergen Reactions  . Prednisone Other (See Comments)    hallucinations    Patient Measurements: Height: 5' (152.4 cm) Weight: 161 lb 7.8 oz (73.2 kg) IBW/kg (Calculated) : 45.5 Heparin Dosing Weight: 62kg  Vital Signs: Temp: 98 F (36.7 C) (01/18 0417) Temp Source: Oral (01/18 0417) BP: 115/74 (01/18 0417) Pulse Rate: 126 (01/18 0417)  Labs: Recent Labs    02/10/18 0830  02/10/18 1147  02/11/18 0500 02/11/18 1146 02/12/18 0359 02/13/18 0423  HGB 12.3  --   --   --  12.7  --  11.9* 13.1  HCT 40.0  --   --   --  40.1  --  37.2 41.2  PLT 218  --   --   --  243  --  248 284  LABPROT  --   --   --   --   --   --  14.9  --   INR  --   --   --   --   --   --  1.18  --   HEPARINUNFRC  --   --  2.16*   < > 0.55 0.52 0.52 <0.10*  CREATININE  --    < >  --   --  1.81*  --  1.92* 1.67*  TROPONINI 0.59*  --  0.56*  --   --   --   --   --    < > = values in this interval not displayed.    Estimated Creatinine Clearance: 20.8 mL/min (A) (by C-G formula based on SCr of 1.67 mg/dL (H)).   Medical History: Past Medical History:  Diagnosis Date  . Arthritis   . Breast cancer ([place])    breast - left  . Chicken pox   . CKD (chronic kidney disease), stage IV ([place])   . GERD (gastroesophageal reflux disease)   . Gout   . Hepatitis    jaundice  . Hypertension   . Menopause   . Obesity   . Stroke [facility])    a. occult stroke noted on brain imaging 01/2018.  Marland Kitchen Venous insufficiency      Assessment: 85 yo female with afib/RVR and possible ACS on heparin. Also with work-up for RLE ischemia (no occlusive disease on noninvasive studies) -SCr= 1.67 (baseline ~ 2), CrCl ~ 20 -CBC stable -heparin level subtherapeutic this morning at <0.1. Per RN pt lost IV access for 1hr 14 mins overnight. No bleeding reported.   Goal of Therapy:  Heparin level 0.3-0.7 units/ml Monitor platelets by  anticoagulation protocol: Yes   Plan:  -Heparin bolus 1000 units -Resume heparin at last therapeutic dose 750 units/hr -Recheck heparin level in 6 hours -Daily heparin level and CBC -Monitor s/sx bleeding   Juanell Fairly, PharmD PGY1 Pharmacy Resident Phone [phone] 02/13/2018 7:34 AM

## 2018-02-13 NOTE — Progress Notes (Signed)
Patient ID: Kendra Bray, female   DOB: [birth year], 85 y.o.   MRN: [id number]  PROGRESS NOTE    Kendra Bray  EML:[id number] DOB: 10/16/1927 DOA: 02/09/2018 PCP: Dorena Cookey, MD   Brief Narrative:  Patient-year-old female with history of chronic kidney disease, breast cancer in remission, gout presented on 02/09/2018 after she was found to be confused.  She was found to have A. fib with RVR along with persistent confusion in the ED.  She was also found to have cooler right lower extremity.  She was transferred to [facility] for vascular evaluation.  Cardiology was also consulted.  She was initially started on Cardizem and heparin drips.   Assessment & Plan:   Principal Problem:   Atrial fibrillation with RVR (HCC) Active Problems:   BREAST CANCER, HX OF   Acute encephalopathy   CKD (chronic kidney disease), stage III (HCC)   Atrial fibrillation (HCC)   Typical atrial flutter (HCC)   Acute on chronic combined systolic and diastolic CHF (congestive heart failure) (HCC)  Paroxysmal atrial fibrillation with rapid ventricular rate -Cardiology following.  Currently still tachycardic.  Initially started on Cardizem drip which has been changed to oral metoprolol.  Oral Cardizem has also been added by cardiology.  Continue heparin drip for now. -Might need TEE/cardioversion if rate does not improve.  Acute metabolic encephalopathy -MRI of the brain was negative for acute infarct. -Monitor mental status.  Fall precautions. -Vitamin B12 absorption limits.  TSH slightly elevated but normal T4 and T3. -Mental status is improving but still slightly confused. -Chest x-ray was negative for infection, UA was negative.  Acute cardiomyopathy -Echo showed EF of 15 to 20% with diffuse hypokinesis.  Strict input and output.  Daily weights.  Cardiology is planning to do cardiac cath probably next week once renal function is slightly stabilized.  Mildly elevated troponins -Most likely  secondary to demand ischemia from above.  Troponins did not trend up.  Right lower extremity ischemia was ruled out -Vascular surgery evaluation appreciated.  No evidence of occlusive arterial disease on imaging.  No further work-up needed as per vascular surgery.  Chronic kidney disease stage III with metabolic acidosis -Creatinine 1.67  today.  Patient is on normal saline at 125 cc an hour as per cardiology.  If respiratory status worsens, might have to consider discontinuing IV fluids.  Prior creatinine 1.6-2 -Monitor  Leukocytosis -Probably reactive.  Monitor   DVT prophylaxis: Heparin Code Status: DNR.   Family Communication: Daughter at bedside Disposition Plan: Depends on clinical outcome  Consultants: Cardiology/vascular surgery  Procedures:  ECHO 02/11/2018 - Left ventricle: The cavity size was moderately dilated. Wall thickness was increased in a pattern of mild LVH. Systolic function was severely reduced. The estimated ejection fraction was in the range of 15% to 20%. Diffuse hypokinesis. - Mitral valve: Mildly calcified annulus. Mildly thickened, mildly calcified leaflets . There was moderate regurgitation. - Left atrium: The atrium was moderately dilated. - Right ventricle: The cavity size was moderately dilated. Systolic function was moderately reduced. - Tricuspid valve: There was mild-moderate regurgitation. - Pulmonary arteries: Systolic pressure was moderately increased. PA peak pressure: 51 mm Hg (S).  Antimicrobials: None   Subjective: Patient seen and examined at bedside.  She is awake but still pleasantly confused.  No complaints of overnight worsening shortness of breath or chest pain.  Poor historian  objective: Vitals:   02/13/18 0002 02/13/18 0417 02/13/18 0901 02/13/18 0910  BP: 91/60 115/74 111/86   Pulse: Marland Kitchen)  129 (!) 126  (!) 126  Resp: 14 (!) 28    Temp:  98 F (36.7 C)    TempSrc:  Oral    SpO2:  99%    Weight:  73.2 kg      Height:        Intake/Output Summary (Last 24 hours) at 02/13/2018 1048 Last data filed at 02/13/2018 5027 Gross per 24 hour  Intake 2754.66 ml  Output 400 ml  Net 2354.66 ml   Filed Weights   02/11/18 0558 02/12/18 0406 02/13/18 0417  Weight: 73 kg 73.3 kg 73.2 kg    Examination:  General exam: Elderly female lying in bed.  No acute distress.  Awake but pleasantly confused.  Poor historian respiratory system: Bilateral decreased breath sounds at bases with some scattered basilar crackles, no wheezing cardiovascular system: S1 & S2 heard, still tachycardic Gastrointestinal system: Abdomen is nondistended, soft and nontender. Normal bowel sounds heard. Extremities: No cyanosis; trace edema    Data Reviewed: I have personally reviewed following labs and imaging studies  CBC: Recent Labs  Lab 02/09/18 1642 02/10/18 0830 02/11/18 0500 02/12/18 0359 02/13/18 0423  WBC 12.9* 11.8* 12.4* 12.8* 11.8*  HGB 14.0 12.3 12.7 11.9* 13.1  HCT 44.4 40.0 40.1 37.2 41.2  MCV 100.9* 103.6* 98.0 97.1 101.2*  PLT 279 218 243 248 741   Basic Metabolic Panel: Recent Labs  Lab 02/09/18 1633  02/09/18 1642 02/10/18 0917 02/11/18 0500 02/12/18 0359 02/13/18 0423  NA  --    < > 140 139 139 134* 136  K  --    < > 4.3 4.3 5.0 4.4 4.8  CL  --    < > 108 110 112* 109 111  CO2  --   --  19* 18* 15* 18* 13*  GLUCOSE  --    < > 147* 138* 128* 129* 163*  BUN  --    < > 54* 55* 57* 61* 60*  CREATININE  --    < > 2.02* 1.77* 1.81* 1.92* 1.67*  CALCIUM  --   --  10.3 9.4 8.9 9.6 9.8  MG 2.4  --   --   --   --  2.2 2.3   < > = values in this interval not displayed.   GFR: Estimated Creatinine Clearance: 20.8 mL/min (A) (by C-G formula based on SCr of 1.67 mg/dL (H)). Liver Function Tests: Recent Labs  Lab 02/09/18 1642 02/11/18 0500 02/12/18 0359  AST 41 65* 41  ALT 56* 99* 86*  ALKPHOS 80 82 81  BILITOT 1.1 1.0 0.9  PROT 6.8 5.8* 5.4*  ALBUMIN 4.1 3.3* 3.2*   No results for  input(s): LIPASE, AMYLASE in the last 168 hours. Recent Labs  Lab 02/10/18 1030  AMMONIA 27   Coagulation Profile: Recent Labs  Lab 02/12/18 0359  INR 1.18   Cardiac Enzymes: Recent Labs  Lab 02/09/18 1633 02/10/18 0830 02/10/18 1147  TROPONINI 0.79* 0.59* 0.56*   BNP (last 3 results) No results for input(s): PROBNP in the last 8760 hours. HbA1C: Recent Labs    02/11/18 0500  HGBA1C 6.1*   CBG: Recent Labs  Lab 02/09/18 1702  GLUCAP 138*   Lipid Profile: Recent Labs    02/11/18 0500  CHOL 129  HDL 37*  LDLCALC 75  TRIG 84  CHOLHDL 3.5   Thyroid Function Tests: No results for input(s): TSH, T4TOTAL, FREET4, T3FREE, THYROIDAB in the last 72 hours. Anemia Panel: No results for input(s): VITAMINB12, FOLATE,  FERRITIN, TIBC, IRON, RETICCTPCT in the last 72 hours. Sepsis Labs: Recent Labs  Lab 02/09/18 1637 02/09/18 2038  LATICACIDVEN 2.46* 1.80    Recent Results (from the past 240 hour(s))  Blood culture (routine x 2)     Status: None (Preliminary result)   Collection Time: 02/09/18  4:38 PM  Result Value Ref Range Status   Specimen Description   Final    BLOOD RIGHT HAND Performed at [facility] [address], [place], [place] [address]    Special Requests   Final    BOTTLES DRAWN AEROBIC AND ANAEROBIC Blood Culture results may not be optimal due to an inadequate volume of blood received in culture bottles Performed at [facility] [address], [place], [place] [address]    Culture   Final    NO GROWTH 2 DAYS Performed at [facility] [address] [place], [place] [address]    Report Status PENDING  Incomplete  Blood culture (routine x 2)     Status: None (Preliminary result)   Collection Time: 02/09/18  4:42 PM  Result Value Ref Range Status   Specimen Description   Final    BLOOD RIGHT ANTECUBITAL Performed at [facility] [address] [place], [place] [address]      Special Requests   Final    BOTTLES DRAWN AEROBIC AND ANAEROBIC Blood Culture results may not be optimal due to an excessive volume of blood received in culture bottles Performed at [facility] [address], [place], [place] [address]    Culture   Final    NO GROWTH 3 DAYS Performed at [facility], [place] [address] [place], [place] [address]    Report Status PENDING  Incomplete         Radiology Studies: No results found.      Scheduled Meds: . allopurinol  100 mg Oral Daily  . diltiazem  30 mg Oral Q6H  . metoprolol tartrate  50 mg Oral TID   Continuous Infusions: . sodium chloride 125 mL/hr at 02/13/18 0400  . heparin 750 Units/hr (02/13/18 0400)     LOS: 4 days        Aline August, MD [facility] Pager [phone]  If 7PM-7AM, please contact night-coverage [email or web address] Password [facility] 02/13/2018, 10:48 AM

## 2018-02-13 NOTE — Progress Notes (Signed)
Progress Note  Patient Name: Kendra Bray Date of Encounter: 02/13/2018  Primary Cardiologist: Sanda Klein, MD   Subjective   Denies any pain or SOB. Speak fast and hard to understand her words, but she is aware of where she is and has not problem with instruction and able to count from 1 through 10.   Inpatient Medications    Scheduled Meds: . allopurinol  100 mg Oral Daily  . metoprolol tartrate  50 mg Oral TID   Continuous Infusions: . sodium chloride 125 mL/hr at 02/13/18 0400  . heparin 750 Units/hr (02/13/18 0400)   PRN Meds: acetaminophen **OR** acetaminophen, ondansetron **OR** ondansetron (ZOFRAN) IV   Vital Signs    Vitals:   02/12/18 2227 02/12/18 2231 02/13/18 0002 02/13/18 0417  BP: 124/77 124/77 91/60 115/74  Pulse: (!) 130  (!) 129 (!) 126  Resp:  (!) 26 14 (!) 28  Temp:    98 F (36.7 C)  TempSrc:    Oral  SpO2:    99%  Weight:    73.2 kg  Height:        Intake/Output Summary (Last 24 hours) at 02/13/2018 0725 Last data filed at 02/13/2018 4132 Gross per 24 hour  Intake 2994.66 ml  Output 1300 ml  Net 1694.66 ml   Last 3 Weights 02/13/2018 02/12/2018 02/11/2018  Weight (lbs) 161 lb 7.8 oz 161 lb 8 oz 160 lb 15 oz  Weight (kg) 73.25 kg 73.256 kg 73 kg      Telemetry    2:1 atrial flutter, HR 130 - Personally Reviewed  ECG    2:1 atrial flutter - Personally Reviewed  Physical Exam   GEN: No acute distress.   Neck: No JVD Cardiac: tachycardic, no murmurs, rubs, or gallops.  Respiratory: Clear to auscultation bilaterally. GI: Soft, nontender, non-distended  MS: No edema; No deformity. Neuro:  Nonfocal  Psych: Normal affect   Labs    Chemistry Recent Labs  Lab 02/09/18 1642  02/11/18 0500 02/12/18 0359 02/13/18 0423  NA 140   < > 139 134* 136  K 4.3   < > 5.0 4.4 4.8  CL 108   < > 112* 109 111  CO2 19*   < > 15* 18* 13*  GLUCOSE 147*   < > 128* 129* 163*  BUN 54*   < > 57* 61* 60*  CREATININE 2.02*   < > 1.81* 1.92*  1.67*  CALCIUM 10.3   < > 8.9 9.6 9.8  PROT 6.8  --  5.8* 5.4*  --   ALBUMIN 4.1  --  3.3* 3.2*  --   AST 41  --  65* 41  --   ALT 56*  --  99* 86*  --   ALKPHOS 80  --  82 81  --   BILITOT 1.1  --  1.0 0.9  --   GFRNONAA 21*   < > 25* 23* 27*  GFRAA 25*   < > 28* 26* 31*  ANIONGAP 13   < > 12 7 12    < > = values in this interval not displayed.     Hematology Recent Labs  Lab 02/11/18 0500 02/12/18 0359 02/13/18 0423  WBC 12.4* 12.8* 11.8*  RBC 4.09 3.83* 4.07  HGB 12.7 11.9* 13.1  HCT 40.1 37.2 41.2  MCV 98.0 97.1 101.2*  MCH 31.1 31.1 32.2  MCHC 31.7 32.0 31.8  RDW 14.8 15.1 15.8*  PLT 243 248 284    Cardiac Enzymes  Recent Labs  Lab 02/09/18 1633 02/10/18 0830 02/10/18 1147  TROPONINI 0.79* 0.59* 0.56*   No results for input(s): TROPIPOC in the last 168 hours.   BNPNo results for input(s): BNP, PROBNP in the last 168 hours.   DDimer No results for input(s): DDIMER in the last 168 hours.   Radiology    No results found.  Cardiac Studies   Echo 02/10/2018 LV EF: 15% -   20% Study Conclusions  - Left ventricle: The cavity size was moderately dilated. Wall   thickness was increased in a pattern of mild LVH. Systolic   function was severely reduced. The estimated ejection fraction   was in the range of 15% to 20%. Diffuse hypokinesis. - Mitral valve: Mildly calcified annulus. Mildly thickened, mildly   calcified leaflets . There was moderate regurgitation. - Left atrium: The atrium was moderately dilated. - Right ventricle: The cavity size was moderately dilated. Systolic   function was moderately reduced. - Tricuspid valve: There was mild-moderate regurgitation. - Pulmonary arteries: Systolic pressure was moderately increased.   PA peak pressure: 51 mm Hg (S).  Patient Profile     85 y.o. female with PMH of remote L mastectomy for breast CA, CKD stage III, HTN presented with new atrial flutter with RVR and AMS. Apparently daughter could not reach her  for 4 days prior to arrival, so her daughter came from [place] to check on her. She was confused, had hallucination and speak nonsensically, therefore she was sent to the hospital. Echo showed significant LV dysfunction. Brain MRI showed old stroke. She was evaluated by vascular with RLE coldness, but U/S did not show any LE arterial blockage.   Assessment & Plan    1. LV dysfunction: EF 15-20% on echo 02/10/2018. Planning for cardiac catheterization prior to TEE/DCCV  2. Atrial flutter with RVR: CHA2DS2-Vasc score 7 (age 83, female 1, CHF 1, CVA 2, HTN 1). Metoprolol increased to 50mg  TID, HR still not very well controlled.   - HR not very well controlled. Hesitant to use digoxin given renal dysfunction. May consider amiodarone if TEE negative.   3. AMS: unclear reason, MRI of brain showed old R frontal lobe infarct associated with encephalomalacia, no acute intracranial abnormality  4. Acute on chronic renal insufficiency, stage III/IV: Cr on admission was 1.9 on arrival, Cr 1.67 today.  5. Elevated troponin: Trop 0.79 --> 0.59 --> 0.56.        For questions or updates, please contact [place] Please consult [email or web address] for contact info under        Signed, Almyra Deforest, [place]  02/13/2018, 7:25 AM

## 2018-02-14 DIAGNOSIS — D729 Disorder of white blood cells, unspecified: Secondary | ICD-10-CM | POA: Diagnosis not present

## 2018-02-14 DIAGNOSIS — I4819 Other persistent atrial fibrillation: Secondary | ICD-10-CM

## 2018-02-14 LAB — CBC
HCT: 43.2 % (ref 36.0–46.0)
Hemoglobin: 13.5 g/dL (ref 12.0–15.0)
MCH: 32 pg (ref 26.0–34.0)
MCHC: 31.3 g/dL (ref 30.0–36.0)
MCV: 102.4 fL — ABNORMAL HIGH (ref 80.0–100.0)
NRBC: 4.7 % — AB (ref 0.0–0.2)
Platelets: 274 10*3/uL (ref 150–400)
RBC: 4.22 MIL/uL (ref 3.87–5.11)
RDW: 16.1 % — ABNORMAL HIGH (ref 11.5–15.5)
WBC: 14.6 10*3/uL — ABNORMAL HIGH (ref 4.0–10.5)

## 2018-02-14 LAB — BASIC METABOLIC PANEL
Anion gap: 12 (ref 5–15)
Anion gap: 14 (ref 5–15)
BUN: 63 mg/dL — ABNORMAL HIGH (ref 8–23)
BUN: 76 mg/dL — AB (ref 8–23)
CO2: 13 mmol/L — ABNORMAL LOW (ref 22–32)
CO2: 8 mmol/L — ABNORMAL LOW (ref 22–32)
CREATININE: 2.31 mg/dL — AB (ref 0.44–1.00)
Calcium: 9.6 mg/dL (ref 8.9–10.3)
Calcium: 9.8 mg/dL (ref 8.9–10.3)
Chloride: 113 mmol/L — ABNORMAL HIGH (ref 98–111)
Chloride: 115 mmol/L — ABNORMAL HIGH (ref 98–111)
Creatinine, Ser: 2.03 mg/dL — ABNORMAL HIGH (ref 0.44–1.00)
GFR calc Af Amer: 25 mL/min — ABNORMAL LOW (ref >60)
GFR calc Af Amer: 21 mL/min — ABNORMAL LOW (ref >60)
GFR calc non Af Amer: 21 mL/min — ABNORMAL LOW (ref >60)
GFR calc non Af Amer: 18 mL/min — ABNORMAL LOW (ref >60)
Glucose, Bld: 142 mg/dL — ABNORMAL HIGH (ref 70–99)
Glucose, Bld: 138 mg/dL — ABNORMAL HIGH (ref 70–99)
POTASSIUM: 5.7 mmol/L — AB (ref 3.5–5.1)
Potassium: 6 mmol/L — ABNORMAL HIGH (ref 3.5–5.1)
Sodium: 138 mmol/L (ref 135–145)
Sodium: 137 mmol/L (ref 135–145)

## 2018-02-14 LAB — CULTURE, BLOOD (ROUTINE X 2): Culture: NO GROWTH

## 2018-02-14 LAB — HEPARIN LEVEL (UNFRACTIONATED): Heparin Unfractionated: 0.61 IU/mL (ref 0.30–0.70)

## 2018-02-14 LAB — MAGNESIUM: Magnesium: 2.4 mg/dL (ref 1.7–2.4)

## 2018-02-14 MED ORDER — DEXTROSE 50 % IV SOLN
25.0000 mL | Freq: Once | INTRAVENOUS | Status: AC
  Administered 2018-02-14: 25 mL via INTRAVENOUS
  Filled 2018-02-14: qty 50

## 2018-02-14 MED ORDER — ALBUTEROL SULFATE (2.5 MG/3ML) 0.083% IN NEBU
2.5000 mg | INHALATION_SOLUTION | Freq: Once | RESPIRATORY_TRACT | Status: AC
  Administered 2018-02-14: 2.5 mg via RESPIRATORY_TRACT
  Filled 2018-02-14: qty 3

## 2018-02-14 MED ORDER — FUROSEMIDE 10 MG/ML IJ SOLN
80.0000 mg | Freq: Once | INTRAMUSCULAR | Status: AC
  Administered 2018-02-14: 80 mg via INTRAVENOUS
  Filled 2018-02-14: qty 8

## 2018-02-14 MED ORDER — SODIUM POLYSTYRENE SULFONATE 15 GM/60ML PO SUSP
30.0000 g | Freq: Once | ORAL | Status: AC
  Administered 2018-02-14: 30 g via ORAL
  Filled 2018-02-14: qty 120

## 2018-02-14 MED ORDER — INSULIN REGULAR HUMAN 100 UNIT/ML IJ SOLN
5.0000 [IU] | Freq: Once | INTRAMUSCULAR | Status: AC
  Administered 2018-02-14: 5 [IU] via INTRAVENOUS
  Filled 2018-02-14: qty 10

## 2018-02-14 MED ORDER — SODIUM BICARBONATE 8.4 % IV SOLN
INTRAVENOUS | Status: DC
  Administered 2018-02-14 – 2018-02-15 (×2): via INTRAVENOUS
  Filled 2018-02-14 (×4): qty 100

## 2018-02-14 MED ORDER — INSULIN REGULAR BOLUS VIA INFUSION: 5.0000 [IU] | Freq: Once | INTRAVENOUS | Status: DC

## 2018-02-14 NOTE — Progress Notes (Addendum)
Patient ID: Kendra Bray, female   DOB: 1927/06/28, 85 y.o.   MRN: [id number]  PROGRESS NOTE    Kendra Bray  ZJQ:[id number] DOB: December 21, 1927 DOA: 02/09/2018 PCP: Dorena Cookey, MD   Brief Narrative:  Patient-year-old female with history of chronic kidney disease, breast cancer in remission, gout presented on 02/09/2018 after she was found to be confused.  She was found to have A. fib with RVR along with persistent confusion in the ED.  She was also found to have cooler right lower extremity.  She was transferred to [facility] for vascular evaluation.  Cardiology was also consulted.  She was initially started on Cardizem and heparin drips.   Assessment & Plan:   Principal Problem:   Atrial fibrillation with RVR (HCC) Active Problems:   BREAST CANCER, HX OF   Acute encephalopathy   CKD (chronic kidney disease), stage III (HCC)   Atrial fibrillation (HCC)   Typical atrial flutter (HCC)   Acute on chronic combined systolic and diastolic CHF (congestive heart failure) (HCC)  Paroxysmal atrial fibrillation with rapid ventricular rate -Cardiology following.  Heart rate better controlled.  Initially started on Cardizem drip which has been changed to oral metoprolol.  Oral Cardizem was discontinued because of bradycardia and hypotension - continue heparin drip for now. -Might need TEE/cardioversion if rate does not improve.  Acute metabolic encephalopathy -MRI of the brain was negative for acute infarct. -Monitor mental status.  Fall precautions. -Vitamin B12 absorption limits.  TSH slightly elevated but normal T4 and T3. -Mental status is improving but still slightly confused. -Chest x-ray was negative for infection, UA was negative.  Acute cardiomyopathy -Echo showed EF of 15 to 20% with diffuse hypokinesis.  Strict input and output.  Daily weights.  Cardiology is planning to do cardiac cath probably next week  -Patient was started on IV fluids normal saline at 125 cc an  hour per cardiology on 02/12/2018.  I had decreased it to 75 cc an hour on 02/13/2018.  She has positive balance of more than 3 L since admission.  Getting IV Lasix as per cardiology.  Strict input and output and daily weights.  DC IV fluids.  Mildly elevated troponins -Most likely secondary to demand ischemia from above.  Troponins did not trend up.  Right lower extremity ischemia was ruled out -Vascular surgery evaluation appreciated.  No evidence of occlusive arterial disease on imaging.  No further work-up needed as per vascular surgery.  Chronic kidney disease stage III with metabolic acidosis -Creatinine 2.03  today.     Prior creatinine 1.6-2 -IV fluid and diuretic plan as above -Monitor  Leukocytosis -Probably reactive.  Monitor   DVT prophylaxis: Heparin Code Status: DNR.   Family Communication: Daughter at bedside Disposition Plan: Depends on clinical outcome  Consultants: Cardiology/vascular surgery  Procedures:  ECHO 02/11/2018 - Left ventricle: The cavity size was moderately dilated. Wall thickness was increased in a pattern of mild LVH. Systolic function was severely reduced. The estimated ejection fraction was in the range of 15% to 20%. Diffuse hypokinesis. - Mitral valve: Mildly calcified annulus. Mildly thickened, mildly calcified leaflets . There was moderate regurgitation. - Left atrium: The atrium was moderately dilated. - Right ventricle: The cavity size was moderately dilated. Systolic function was moderately reduced. - Tricuspid valve: There was mild-moderate regurgitation. - Pulmonary arteries: Systolic pressure was moderately increased. PA peak pressure: 51 mm Hg (S).  Antimicrobials: None   Subjective: Patient seen and examined at bedside.  She is  sleepy, wakes up only slightly; doesn't answer much questions.  No overnight worsening shortness of breath or chest pain.  Poor historian.   Objective: Vitals:   02/14/18 0046 02/14/18 0407  02/14/18 0742 02/14/18 0800  BP: 92/69 (!) 96/56 112/83   Pulse:  80    Resp: (!) 37 (!) 36 (!) 26   Temp:  97.6 F (36.4 C)  (!) 97.3 F (36.3 C)  TempSrc:  Oral  Rectal  SpO2:  100%    Weight:  77.3 kg    Height:        Intake/Output Summary (Last 24 hours) at 02/14/2018 1117 Last data filed at 02/14/2018 0657 Gross per 24 hour  Intake 120 ml  Output 510 ml  Net -390 ml   Filed Weights   02/12/18 0406 02/13/18 0417 02/14/18 0407  Weight: 73.3 kg 73.2 kg 77.3 kg    Examination:  General exam: Elderly female lying in bed.  No distress.  Sleepy. Poor historian respiratory system: Bilateral decreased breath sounds at bases with  basilar crackles cardiovascular system: S1 & S2 heard, rate controlled Gastrointestinal system: Abdomen is nondistended, soft and nontender. Normal bowel sounds heard. Extremities: No cyanosis; trace edema    Data Reviewed: I have personally reviewed following labs and imaging studies  CBC: Recent Labs  Lab 02/10/18 0830 02/11/18 0500 02/12/18 0359 02/13/18 0423 02/14/18 0402  WBC 11.8* 12.4* 12.8* 11.8* 14.6*  HGB 12.3 12.7 11.9* 13.1 13.5  HCT 40.0 40.1 37.2 41.2 43.2  MCV 103.6* 98.0 97.1 101.2* 102.4*  PLT 218 243 248 284 106   Basic Metabolic Panel: Recent Labs  Lab 02/09/18 1633  02/10/18 0917 02/11/18 0500 02/12/18 0359 02/13/18 0423 02/14/18 0402  NA  --    < > 139 139 134* 136 138  K  --    < > 4.3 5.0 4.4 4.8 5.7*  CL  --    < > 110 112* 109 111 113*  CO2  --    < > 18* 15* 18* 13* 13*  GLUCOSE  --    < > 138* 128* 129* 163* 142*  BUN  --    < > 55* 57* 61* 60* 63*  CREATININE  --    < > 1.77* 1.81* 1.92* 1.67* 2.03*  CALCIUM  --    < > 9.4 8.9 9.6 9.8 9.6  MG 2.4  --   --   --  2.2 2.3 2.4   < > = values in this interval not displayed.   GFR: Estimated Creatinine Clearance: 17.6 mL/min (A) (by C-G formula based on SCr of 2.03 mg/dL (H)). Liver Function Tests: Recent Labs  Lab 02/09/18 1642 02/11/18 0500  02/12/18 0359  AST 41 65* 41  ALT 56* 99* 86*  ALKPHOS 80 82 81  BILITOT 1.1 1.0 0.9  PROT 6.8 5.8* 5.4*  ALBUMIN 4.1 3.3* 3.2*   No results for input(s): LIPASE, AMYLASE in the last 168 hours. Recent Labs  Lab 02/10/18 1030  AMMONIA 27   Coagulation Profile: Recent Labs  Lab 02/12/18 0359  INR 1.18   Cardiac Enzymes: Recent Labs  Lab 02/09/18 1633 02/10/18 0830 02/10/18 1147  TROPONINI 0.79* 0.59* 0.56*   BNP (last 3 results) No results for input(s): PROBNP in the last 8760 hours. HbA1C: No results for input(s): HGBA1C in the last 72 hours. CBG: Recent Labs  Lab 02/09/18 1702  GLUCAP 138*   Lipid Profile: No results for input(s): CHOL, HDL, LDLCALC, TRIG, CHOLHDL, LDLDIRECT in  the last 72 hours. Thyroid Function Tests: No results for input(s): TSH, T4TOTAL, FREET4, T3FREE, THYROIDAB in the last 72 hours. Anemia Panel: No results for input(s): VITAMINB12, FOLATE, FERRITIN, TIBC, IRON, RETICCTPCT in the last 72 hours. Sepsis Labs: Recent Labs  Lab 02/09/18 1637 02/09/18 2038  LATICACIDVEN 2.46* 1.80    Recent Results (from the past 240 hour(s))  Blood culture (routine x 2)     Status: None (Preliminary result)   Collection Time: 02/09/18  4:38 PM  Result Value Ref Range Status   Specimen Description   Final    BLOOD RIGHT HAND Performed at [facility] [address] [place], [place] [address]    Special Requests   Final    BOTTLES DRAWN AEROBIC AND ANAEROBIC Blood Culture results may not be optimal due to an inadequate volume of blood received in culture bottles Performed at [facility] [address], [place], [place] [address]    Culture   Final    NO GROWTH 3 DAYS Performed at [facility], [place] [address], [place], [place] [address]    Report Status PENDING  Incomplete  Blood culture (routine x 2)     Status: None (Preliminary result)   Collection Time: 02/09/18  4:42 PM  Result Value Ref Range  Status   Specimen Description   Final    BLOOD RIGHT ANTECUBITAL Performed at [facility] [address] [place], [place] [address]    Special Requests   Final    BOTTLES DRAWN AEROBIC AND ANAEROBIC Blood Culture results may not be optimal due to an excessive volume of blood received in culture bottles Performed at [facility] [address] [place], [place] [address]    Culture   Final    NO GROWTH 4 DAYS Performed at [facility], [place] [address] [place], [place] [address]    Report Status PENDING  Incomplete         Radiology Studies: No results found.      Scheduled Meds: . allopurinol  100 mg Oral Daily  . metoprolol tartrate  50 mg Oral TID   Continuous Infusions: . sodium chloride 75 mL/hr at 02/13/18 2356  . heparin 800 Units/hr (02/13/18 2126)     LOS: 5 days        Aline August, MD [facility] Pager [phone]  If 7PM-7AM, please contact night-coverage [email or web address] Password [email or web address] 02/14/2018, 11:17 AM

## 2018-02-14 NOTE — Progress Notes (Signed)
Patient HR dropped to 30. HR has been sustaining in the 30's. SBP 85-92. Also had a 2.49 seconds pause. Scheduled Diltiazem held. On call Bodenheimer C, NP made aware. Will continue to monitor.

## 2018-02-14 NOTE — Progress Notes (Signed)
[place] for heparin  Indication: chest pain/ACS  Allergies  Allergen Reactions  . Prednisone Other (See Comments)    hallucinations    Patient Measurements: Height: 5' (152.4 cm) Weight: 170 lb 8 oz (77.3 kg) IBW/kg (Calculated) : 45.5 Heparin Dosing Weight: 62kg  Vital Signs: Temp: 97.3 F (36.3 C) (01/19 0800) Temp Source: Rectal (01/19 0800) BP: 112/83 (01/19 0742) Pulse Rate: 80 (01/19 0407)  Labs: Recent Labs    02/12/18 0359 02/13/18 0423 02/13/18 1414 02/14/18 0402  HGB 11.9* 13.1  --  13.5  HCT 37.2 41.2  --  43.2  PLT 248 284  --  274  LABPROT 14.9  --   --   --   INR 1.18  --   --   --   HEPARINUNFRC 0.52 <0.10* 0.30 0.61  CREATININE 1.92* 1.67*  --  2.03*    Estimated Creatinine Clearance: 17.6 mL/min (A) (by C-G formula based on SCr of 2.03 mg/dL (H)).   Medical History: Past Medical History:  Diagnosis Date  . Arthritis   . Breast cancer ([place])    breast - left  . Chicken pox   . CKD (chronic kidney disease), stage IV ([place])   . GERD (gastroesophageal reflux disease)   . Gout   . Hepatitis    jaundice  . Hypertension   . Menopause   . Obesity   . Stroke [facility])    a. occult stroke noted on brain imaging 01/2018.  Marland Kitchen Venous insufficiency      Assessment: 85 yo female with afib/RVR and possible ACS on heparin. Also with work-up for RLE ischemia (no occlusive disease on noninvasive studies)  Heparin level therapeutic at 0.61, H/H stable.  Goal of Therapy:  Heparin level 0.3-0.7 units/ml Monitor platelets by anticoagulation protocol: Yes   Plan:  -Continue heparin 800 units/hr -Daily heparin level and CBC -Monitor S/Sx bleeding   Arrie Senate, PharmD, BCPS Clinical Pharmacist [phone] Please check AMION for all [place] numbers 02/14/2018

## 2018-02-14 NOTE — Progress Notes (Signed)
Scheduled 0600 Dildiazem not given due to low BP. On call Arby Barrette, NP made aware.

## 2018-02-14 NOTE — Progress Notes (Signed)
Pt's output so far this shift is 100 mL and post void bladder scan at this time was 205 mL despite of 80 mg IV lasix.  Eulas Post, [place] made aware.  Order for BMET.  Idolina Primer, RN

## 2018-02-14 NOTE — Progress Notes (Addendum)
Recheck on the patient, after a dose of Kayexalate. EKG did not showed peak T waves. During interview, she is actually more alert now. BMET labwork pending tomorrow AM. I called cardiology fellow 2 hours ago to make her aware of the patient. Also instructed the nurse to pay close attention to the patient. Will hold on Inotrope for the time being as the patient's condition seems to be improving. Bp is quite stable as well.   Despite her continued complaints of mild dyspnea, I think she may be dry. Will hold off on further diuretic for the time being. Primary team order chest x ray which is pending.   Physical exam: Card: irregular. Bounding pulse on exam Pulm: anterior exam clear Extremities: No significant edema General: no acute pain, awake, watching TV. Able to answer yes or no, still mumbles when speak in long sentence.  GI: abdomen soft on exam.    It is quite questionable at this time whether she will proceed with cath tomorrow.    Hilbert Corrigan PA Pager: [phone]

## 2018-02-14 NOTE — Progress Notes (Signed)
Progress Note  Patient Name: Kendra Bray Date of Encounter: 02/14/2018  Primary Cardiologist: Sanda Klein, MD   Subjective   HR dropped overnight into the 30's - diltiazem held. Currently in the 70's in afib.   Inpatient Medications    Scheduled Meds: . allopurinol  100 mg Oral Daily  . diltiazem  30 mg Oral Q6H  . metoprolol tartrate  50 mg Oral TID   Continuous Infusions: . sodium chloride 75 mL/hr at 02/13/18 2356  . heparin 800 Units/hr (02/13/18 2126)   PRN Meds: acetaminophen **OR** acetaminophen, ondansetron **OR** ondansetron (ZOFRAN) IV   Vital Signs    Vitals:   02/14/18 0045 02/14/18 0046 02/14/18 0407 02/14/18 0742  BP: (!) 85/69 92/69 (!) 96/56 112/83  Pulse:   80   Resp: (!) 22 (!) 37 (!) 36 (!) 26  Temp:   97.6 F (36.4 C)   TempSrc:   Oral   SpO2:   100%   Weight:   77.3 kg   Height:        Intake/Output Summary (Last 24 hours) at 02/14/2018 0815 Last data filed at 02/14/2018 0657 Gross per 24 hour  Intake 120 ml  Output 510 ml  Net -390 ml   Last 3 Weights 02/14/2018 02/13/2018 02/12/2018  Weight (lbs) 170 lb 8 oz 161 lb 7.8 oz 161 lb 8 oz  Weight (kg) 77.338 kg 73.25 kg 73.256 kg      Telemetry    Afib with CVR - Personally Reviewed  ECG    N/A  Physical Exam   General appearance: alert and confused Neck: JVD - several cm above sternal notch, no carotid bruit and thyroid not enlarged, symmetric, no tenderness/mass/nodules Lungs: diminished breath sounds bilaterally Heart: irregularly irregular rhythm Abdomen: soft, non-tender; bowel sounds normal; no masses,  no organomegaly and obese Extremities: edema 1+ edema Pulses: 2+ and symmetric Skin: pale, warm, dry Neurologic: Mental status: Not oriented, confused, speech is not comprehensible Psych: [place]  Lab 02/09/18 1642  02/11/18 0500 02/12/18 0359 02/13/18 0423 02/14/18 0402  NA 140   < > 139 134* 136 138  K 4.3   < >  5.0 4.4 4.8 5.7*  CL 108   < > 112* 109 111 113*  CO2 19*   < > 15* 18* 13* 13*  GLUCOSE 147*   < > 128* 129* 163* 142*  BUN 54*   < > 57* 61* 60* 63*  CREATININE 2.02*   < > 1.81* 1.92* 1.67* 2.03*  CALCIUM 10.3   < > 8.9 9.6 9.8 9.6  PROT 6.8  --  5.8* 5.4*  --   --   ALBUMIN 4.1  --  3.3* 3.2*  --   --   AST 41  --  65* 41  --   --   ALT 56*  --  99* 86*  --   --   ALKPHOS 80  --  82 81  --   --   BILITOT 1.1  --  1.0 0.9  --   --   GFRNONAA 21*   < > 25* 23* 27* 21*  GFRAA 25*   < > 28* 26* 31* 25*  ANIONGAP 13   < > 12 7 12 12    < > = values in this interval not displayed.     Hematology Recent Labs  Lab 02/12/18 0359 02/13/18 0423 02/14/18 0402  WBC 12.8* 11.8* 14.6*  RBC 3.83* 4.07 4.22  HGB 11.9* 13.1 13.5  HCT 37.2 41.2 43.2  MCV 97.1 101.2* 102.4*  MCH 31.1 32.2 32.0  MCHC 32.0 31.8 31.3  RDW 15.1 15.8* 16.1*  PLT 248 284 274    Cardiac Enzymes Recent Labs  Lab 02/09/18 1633 02/10/18 0830 02/10/18 1147  TROPONINI 0.79* 0.59* 0.56*   No results for input(s): TROPIPOC in the last 168 hours.   BNPNo results for input(s): BNP, PROBNP in the last 168 hours.   DDimer No results for input(s): DDIMER in the last 168 hours.   Radiology    No results found.  Cardiac Studies   Echo 02/10/2018 LV EF: 15% -   20% Study Conclusions  - Left ventricle: The cavity size was moderately dilated. Wall   thickness was increased in a pattern of mild LVH. Systolic   function was severely reduced. The estimated ejection fraction   was in the range of 15% to 20%. Diffuse hypokinesis. - Mitral valve: Mildly calcified annulus. Mildly thickened, mildly   calcified leaflets . There was moderate regurgitation. - Left atrium: The atrium was moderately dilated. - Right ventricle: The cavity size was moderately dilated. Systolic   function was moderately reduced. - Tricuspid valve: There was mild-moderate regurgitation. - Pulmonary arteries: Systolic pressure was  moderately increased.   PA peak pressure: 51 mm Hg (S).  Patient Profile     85 y.o. female with PMH of remote L mastectomy for breast CA, CKD stage III, HTN presented with new atrial flutter with RVR and AMS. Apparently daughter could not reach her for 4 days prior to arrival, so her daughter came from [place] to check on her. She was confused, had hallucination and speak nonsensically, therefore she was sent to the hospital. Echo showed significant LV dysfunction. Brain MRI showed old stroke. She was evaluated by vascular with RLE coldness, but U/S did not show any LE arterial blockage.   Assessment & Plan    1. LV dysfunction: EF 15-20% on echo 02/10/2018. Planning for cardiac catheterization prior to TEE/DCCV. Weight up ?4KG overnight - she is +3L. Give lasix today 80 mg IV x 1 today.   2. Atrial flutter with RVR: CHA2DS2-Vasc score 7 (age 2, female 1, CHF 1, CVA 2, HTN 1). Metoprolol increased to 50mg  TID, HR still not very well controlled.   - Diltiazem added yesterday for rapid flutter - overnight HR dropped to 30's, diltiazem held - now in afib with CVR - d/c diltiazem  3. AMS: unclear reason, MRI of brain showed old R frontal lobe infarct associated with encephalomalacia, no acute intracranial abnormality - would strongly consider neurology consultation for further work-up of confusion.  4. Acute on chronic renal insufficiency, stage III/IV: Cr on admission was 1.9 -> now up to 2.03  5. Elevated troponin: Trop 0.79 --> 0.59 --> 0.56, plan eventual cath to evaluate for CAD when mental status improves.  6. Hyperkalemia - potassium 5.7 today with rising creatinine. May need treatment, low potassium diet, no obvious offending agents. Will give lasix which should help.     For questions or updates, please contact [place] Please consult [email or web address] for contact info under   Pixie Casino, MD, FACC, [place] Director of the [facility]  Disorders [facility]  [facility] Diplomate of the American Board of Clinical Lipidology Attending Cardiologist  Direct Dial: [phone]  Fax: [phone]  Website:  [email or web address]  Pixie Casino, MD  02/14/2018, 8:15 AM

## 2018-02-15 ENCOUNTER — Inpatient Hospital Stay (HOSPITAL_COMMUNITY): Payer: Medicare Other

## 2018-02-15 ENCOUNTER — Encounter (HOSPITAL_COMMUNITY)
Admission: EM | Disposition: A | Discharge status: Hospice | Payer: Self-pay | Source: Home / Self Care | Attending: Internal Medicine

## 2018-02-15 LAB — HEPARIN LEVEL (UNFRACTIONATED): Heparin Unfractionated: 0.22 IU/mL — ABNORMAL LOW (ref 0.30–0.70)

## 2018-02-15 LAB — CULTURE, BLOOD (ROUTINE X 2): Culture: NO GROWTH

## 2018-02-15 LAB — PATHOLOGIST SMEAR REVIEW

## 2018-02-15 LAB — BASIC METABOLIC PANEL
ANION GAP: 14 (ref 5–15)
BUN: 79 mg/dL — ABNORMAL HIGH (ref 8–23)
CO2: 12 mmol/L — ABNORMAL LOW (ref 22–32)
Calcium: 9.3 mg/dL (ref 8.9–10.3)
Chloride: 112 mmol/L — ABNORMAL HIGH (ref 98–111)
Creatinine, Ser: 2.5 mg/dL — ABNORMAL HIGH (ref 0.44–1.00)
GFR calc Af Amer: 19 mL/min — ABNORMAL LOW (ref >60)
GFR calc non Af Amer: 17 mL/min — ABNORMAL LOW (ref >60)
Glucose, Bld: 178 mg/dL — ABNORMAL HIGH (ref 70–99)
Potassium: 4.1 mmol/L (ref 3.5–5.1)
Sodium: 138 mmol/L (ref 135–145)

## 2018-02-15 LAB — CBC
HCT: 40.8 % (ref 36.0–46.0)
Hemoglobin: 13.1 g/dL (ref 12.0–15.0)
MCH: 31.9 pg (ref 26.0–34.0)
MCHC: 32.1 g/dL (ref 30.0–36.0)
MCV: 99.3 fL (ref 80.0–100.0)
PLATELETS: 216 10*3/uL (ref 150–400)
RBC: 4.11 MIL/uL (ref 3.87–5.11)
RDW: 16.5 % — ABNORMAL HIGH (ref 11.5–15.5)
WBC: 14.5 10*3/uL — ABNORMAL HIGH (ref 4.0–10.5)
nRBC: 9.9 % — ABNORMAL HIGH (ref 0.0–0.2)

## 2018-02-15 LAB — MAGNESIUM: Magnesium: 2.2 mg/dL (ref 1.7–2.4)

## 2018-02-15 SURGERY — LEFT HEART CATH AND CORONARY ANGIOGRAPHY
Anesthesia: LOCAL

## 2018-02-15 MED ORDER — FUROSEMIDE 10 MG/ML IJ SOLN
120.0000 mg | Freq: Two times a day (BID) | INTRAVENOUS | Status: DC
  Administered 2018-02-15 – 2018-02-18 (×7): 120 mg via INTRAVENOUS
  Filled 2018-02-15: qty 12
  Filled 2018-02-15: qty 10
  Filled 2018-02-15 (×2): qty 12
  Filled 2018-02-15 (×3): qty 10

## 2018-02-15 NOTE — Progress Notes (Signed)
Copy of ACP "Allow for natural death" in filed documents

## 2018-02-15 NOTE — ACP (Advance Care Planning) (Signed)
Allow for natural death document dated 04-May-2000 exists under filed documents for provider reference

## 2018-02-15 NOTE — Progress Notes (Signed)
Patient ID: Kendra Bray, female   DOB: 1927-02-17, 85 y.o.   MRN: [id number]  PROGRESS NOTE    Kendra Bray  MCN:[id number] DOB: [birth year] DOA: 02/09/2018 PCP: Kendra Cookey, MD   Brief Narrative:  Patient-year-old female with history of chronic kidney disease, breast cancer in remission, gout presented on 02/09/2018 after she was found to be confused.  She was found to have A. fib with RVR along with persistent confusion in the ED.  She was also found to have cooler right lower extremity.  She was transferred to [facility] for vascular evaluation.  Cardiology was also consulted.  She was initially started on Cardizem and heparin drips.   Assessment & Plan:   Principal Problem:   Atrial fibrillation with RVR (HCC) Active Problems:   BREAST CANCER, HX OF   Acute encephalopathy   CKD (chronic kidney disease), stage III (HCC)   Atrial fibrillation (HCC)   Typical atrial flutter (HCC)   Acute on chronic combined systolic and diastolic CHF (congestive heart failure) (HCC)  Paroxysmal atrial fibrillation with rapid ventricular rate -Cardiology following.  Still tachycardic.  Initially started on Cardizem drip which has been changed to oral metoprolol.  Oral Cardizem was discontinued because of bradycardia and hypotension - continue heparin drip for now. -Given her worsening renal function and overall medical condition, I do not think that she would be a good candidate for TEE or cardiac catheterization.  Cardiology is also of the same opinion that we should not proceed with cardiac catheterization.  Spoke to the daughter about the same and if condition does not improve within the next 24 or 48 hours, patient might benefit from hospice.  Daughter is understanding.  We will also get palliative care evaluation.  Acute metabolic encephalopathy -MRI of the brain was negative for acute infarct. -Monitor mental status.  Fall precautions. -Vitamin B12 absorption limits.  TSH  slightly elevated but normal T4 and T3. -Very sleepy this morning. -Chest x-ray was negative for infection, UA was negative.  Acute cardiomyopathy -Echo showed EF of 15 to 20% with diffuse hypokinesis.  Strict input and output.  Daily weights.   -Patient received intravenous Lasix on 02/14/2018 and made very little urine output.  She was subsequently started on bicarb drip.  Still urine output very minimal.  Cardiology starting Lasix drip.  Mildly elevated troponins -Most likely secondary to demand ischemia from above.  Troponins did not trend up.  Right lower extremity ischemia was ruled out -Vascular surgery evaluation appreciated.  No evidence of occlusive arterial disease on imaging.  No further work-up needed as per vascular surgery.  Acute kidney injury on chronic kidney disease stage III with metabolic acidosis -Creatinine 2.31  today.     Prior creatinine 1.6-2 -Diuretic plan as above.  Patient was also started on bicarb drip. -Monitor  Leukocytosis -Probably reactive.  Monitor   DVT prophylaxis: Heparin Code Status: DNR.   Family Communication: Daughter at bedside Disposition Plan: Depends on clinical outcome  Consultants: Cardiology/vascular surgery  Procedures:  ECHO 02/11/2018 - Left ventricle: The cavity size was moderately dilated. Wall thickness was increased in a pattern of mild LVH. Systolic function was severely reduced. The estimated ejection fraction was in the range of 15% to 20%. Diffuse hypokinesis. - Mitral valve: Mildly calcified annulus. Mildly thickened, mildly calcified leaflets . There was moderate regurgitation. - Left atrium: The atrium was moderately dilated. - Right ventricle: The cavity size was moderately dilated. Systolic function was moderately reduced. - Tricuspid  valve: There was mild-moderate regurgitation. - Pulmonary arteries: Systolic pressure was moderately increased. PA peak pressure: 51 mm Hg (S).  Antimicrobials:  None   Subjective: Patient seen and examined at bedside.  She is sleepy, does not wake up much.  Poor historian.  No overnight agitation or fever reported.  Daughter at bedside states that she had woken up a little bit this morning but is not eating much.   Objective: Vitals:   02/15/18 0043 02/15/18 0417 02/15/18 0850 02/15/18 0900  BP: (!) 127/100 (!) 133/94 124/82 124/82  Pulse: (!) 128 (!) 128 (!) 130 (!) 130  Resp: (!) 28 (!) 26  (!) 22  Temp:  98.1 F (36.7 C)  98 F (36.7 C)  TempSrc:  Axillary  Axillary  SpO2:  98%  98%  Weight:  79.4 kg    Height:        Intake/Output Summary (Last 24 hours) at 02/15/2018 1242 Last data filed at 02/15/2018 0648 Gross per 24 hour  Intake 1395.96 ml  Output 100 ml  Net 1295.96 ml   Filed Weights   02/13/18 0417 02/14/18 0407 02/15/18 0417  Weight: 73.2 kg 77.3 kg 79.4 kg    Examination:  General exam: Elderly female lying in bed.  No acute distress.  Sleepy  respiratory system: Bilateral decreased breath sounds at bases with  basilar crackles, no wheezing cardiovascular system: S1 & S2 heard, tachycardic Gastrointestinal system: Abdomen is nondistended, soft and nontender. Normal bowel sounds heard. Extremities: No cyanosis; trace edema Lymph: No cervical lymphadenopathy CNS: Barely arousable, moving extremities Skin: No rash or petechia Psych: Could not be evaluated because of mental status    Data Reviewed: I have personally reviewed following labs and imaging studies  CBC: Recent Labs  Lab 02/11/18 0500 02/12/18 0359 02/13/18 0423 02/14/18 0402 02/15/18 0357  WBC 12.4* 12.8* 11.8* 14.6* 14.5*  HGB 12.7 11.9* 13.1 13.5 13.1  HCT 40.1 37.2 41.2 43.2 40.8  MCV 98.0 97.1 101.2* 102.4* 99.3  PLT 243 248 284 274 992   Basic Metabolic Panel: Recent Labs  Lab 02/09/18 1633  02/12/18 0359 02/13/18 0423 02/14/18 0402 02/14/18 1628 02/15/18 0357  NA  --    < > 134* 136 138 137 138  K  --    < > 4.4 4.8 5.7* 6.0*  4.1  CL  --    < > 109 111 113* 115* 112*  CO2  --    < > 18* 13* 13* 8* 12*  GLUCOSE  --    < > 129* 163* 142* 138* 178*  BUN  --    < > 61* 60* 63* 76* 79*  CREATININE  --    < > 1.92* 1.67* 2.03* 2.31* 2.50*  CALCIUM  --    < > 9.6 9.8 9.6 9.8 9.3  MG 2.4  --  2.2 2.3 2.4  --  2.2   < > = values in this interval not displayed.   GFR: Estimated Creatinine Clearance: 14.5 mL/min (A) (by C-G formula based on SCr of 2.5 mg/dL (H)). Liver Function Tests: Recent Labs  Lab 02/09/18 1642 02/11/18 0500 02/12/18 0359  AST 41 65* 41  ALT 56* 99* 86*  ALKPHOS 80 82 81  BILITOT 1.1 1.0 0.9  PROT 6.8 5.8* 5.4*  ALBUMIN 4.1 3.3* 3.2*   No results for input(s): LIPASE, AMYLASE in the last 168 hours. Recent Labs  Lab 02/10/18 1030  AMMONIA 27   Coagulation Profile: Recent Labs  Lab 02/12/18  0359  INR 1.18   Cardiac Enzymes: Recent Labs  Lab 02/09/18 1633 02/10/18 0830 02/10/18 1147  TROPONINI 0.79* 0.59* 0.56*   BNP (last 3 results) No results for input(s): PROBNP in the last 8760 hours. HbA1C: No results for input(s): HGBA1C in the last 72 hours. CBG: Recent Labs  Lab 02/09/18 1702  GLUCAP 138*   Lipid Profile: No results for input(s): CHOL, HDL, LDLCALC, TRIG, CHOLHDL, LDLDIRECT in the last 72 hours. Thyroid Function Tests: No results for input(s): TSH, T4TOTAL, FREET4, T3FREE, THYROIDAB in the last 72 hours. Anemia Panel: No results for input(s): VITAMINB12, FOLATE, FERRITIN, TIBC, IRON, RETICCTPCT in the last 72 hours. Sepsis Labs: Recent Labs  Lab 02/09/18 1637 02/09/18 2038  LATICACIDVEN 2.46* 1.80    Recent Results (from the past 240 hour(s))  Blood culture (routine x 2)     Status: None   Collection Time: 02/09/18  4:38 PM  Result Value Ref Range Status   Specimen Description   Final    BLOOD RIGHT HAND Performed at [facility] [address], [place], [place] [address]    Special Requests   Final    BOTTLES DRAWN AEROBIC  AND ANAEROBIC Blood Culture results may not be optimal due to an inadequate volume of blood received in culture bottles Performed at [facility] [address] [place], [place] [address]    Culture   Final    NO GROWTH 5 DAYS Performed at [facility], [place] [address] [place], [place] [address]    Report Status 02/15/2018 FINAL  Final  Blood culture (routine x 2)     Status: None   Collection Time: 02/09/18  4:42 PM  Result Value Ref Range Status   Specimen Description   Final    BLOOD RIGHT ANTECUBITAL Performed at [facility] [address], [place], [place] [address]    Special Requests   Final    BOTTLES DRAWN AEROBIC AND ANAEROBIC Blood Culture results may not be optimal due to an excessive volume of blood received in culture bottles Performed at [facility] [address] [place], [place] [address]    Culture   Final    NO GROWTH 5 DAYS Performed at [facility], [place] [address], [place],  [address]    Report Status 02/14/2018 FINAL  Final         Radiology Studies: Dg Chest Port 1 View  Result Date: 02/15/2018 CLINICAL DATA:  Shortness of Breath EXAM: PORTABLE CHEST 1 VIEW COMPARISON:  February 09, 2018 FINDINGS: There is no edema or consolidation. There is cardiomegaly with pulmonary venous hypertension. No adenopathy. No bone lesions. IMPRESSION: Pulmonary vascular congestion.  No evident edema or consolidation. Electronically Signed   By: Lowella Grip III M.D.   On: 02/15/2018 08:05        Scheduled Meds: . allopurinol  100 mg Oral Daily  . metoprolol tartrate  50 mg Oral TID   Continuous Infusions: . furosemide    . heparin 800 Units/hr (02/15/18 0550)  .  sodium bicarbonate  infusion 1000 mL 75 mL/hr at 02/15/18 0219     LOS: 6 days        Aline August, MD [facility] Pager [phone]  If 7PM-7AM, please contact  night-coverage [email or web address] Password [facility] 02/15/2018, 12:42 PM

## 2018-02-15 NOTE — Progress Notes (Signed)
Progress Note  Patient Name: Kendra Bray Date of Encounter: 02/15/2018  Primary Cardiologist: Sanda Klein, MD    Subjective   85 year old female with a history of chronic kidney disease, hypertension, old stroke, breast cancer, obesity who is seen for further evaluation of atrial fibrillation with a rapid ventricular response and congestive heart failure.  Echocardiogram from January 15 reveals severely depressed left ventricular systolic function with an ejection fraction of 15 to 20%.  She has diffuse hypokinesis.  She has moderate pulmonary hypertension with an estimated PA pressure of 51 mmHg.  Over the past couple days her urine output has decreased.  Her creatinine has generally increased.  She has significantly altered mental status this morning.   Inpatient Medications    Scheduled Meds: . allopurinol  100 mg Oral Daily  . metoprolol tartrate  50 mg Oral TID   Continuous Infusions: . heparin 800 Units/hr (02/15/18 0550)  .  sodium bicarbonate  infusion 1000 mL 75 mL/hr at 02/15/18 0219   PRN Meds: acetaminophen **OR** acetaminophen, ondansetron **OR** ondansetron (ZOFRAN) IV   Vital Signs    Vitals:   02/14/18 2157 02/15/18 0043 02/15/18 0417 02/15/18 0850  BP: 125/81 (!) 127/100 (!) 133/94 124/82  Pulse: (!) 106 (!) 128 (!) 128 (!) 130  Resp:  (!) 28 (!) 26   Temp:   98.1 F (36.7 C)   TempSrc:   Axillary   SpO2:   98%   Weight:   79.4 kg   Height:        Intake/Output Summary (Last 24 hours) at 02/15/2018 2440 Last data filed at 02/15/2018 1027 Gross per 24 hour  Intake 1395.96 ml  Output 100 ml  Net 1295.96 ml   Last 3 Weights 02/15/2018 02/14/2018 02/13/2018  Weight (lbs) 175 lb 0.7 oz 170 lb 8 oz 161 lb 7.8 oz  Weight (kg) 79.4 kg 77.338 kg 73.25 kg      Telemetry     atrial fib - Personally Reviewed  ECG     atrial fib  - Personally Reviewed  Physical Exam   GEN: Elderly female, significantly altered mental status Neck: No  JVD Cardiac:  irreg. Irreg.  Respiratory: Clear to auscultation bilaterally. GI: Soft, nontender, non-distended  MS: No edema; No deformity. Neuro:   Minimally responsive Psych: Minimally responsive  Labs    Chemistry Recent Labs  Lab 02/09/18 1642  02/11/18 0500 02/12/18 0359  02/14/18 0402 02/14/18 1628 02/15/18 0357  NA 140   < > 139 134*   < > 138 137 138  K 4.3   < > 5.0 4.4   < > 5.7* 6.0* 4.1  CL 108   < > 112* 109   < > 113* 115* 112*  CO2 19*   < > 15* 18*   < > 13* 8* 12*  GLUCOSE 147*   < > 128* 129*   < > 142* 138* 178*  BUN 54*   < > 57* 61*   < > 63* 76* 79*  CREATININE 2.02*   < > 1.81* 1.92*   < > 2.03* 2.31* 2.50*  CALCIUM 10.3   < > 8.9 9.6   < > 9.6 9.8 9.3  PROT 6.8  --  5.8* 5.4*  --   --   --   --   ALBUMIN 4.1  --  3.3* 3.2*  --   --   --   --   AST 41  --  65* 41  --   --   --   --  ALT 56*  --  99* 86*  --   --   --   --   ALKPHOS 80  --  82 81  --   --   --   --   BILITOT 1.1  --  1.0 0.9  --   --   --   --   GFRNONAA 21*   < > 25* 23*   < > 21* 18* 17*  GFRAA 25*   < > 28* 26*   < > 25* 21* 19*  ANIONGAP 13   < > 12 7   < > 12 14 14    < > = values in this interval not displayed.     Hematology Recent Labs  Lab 02/13/18 0423 02/14/18 0402 02/15/18 0357  WBC 11.8* 14.6* 14.5*  RBC 4.07 4.22 4.11  HGB 13.1 13.5 13.1  HCT 41.2 43.2 40.8  MCV 101.2* 102.4* 99.3  MCH 32.2 32.0 31.9  MCHC 31.8 31.3 32.1  RDW 15.8* 16.1* 16.5*  PLT 284 274 216    Cardiac Enzymes Recent Labs  Lab 02/09/18 1633 02/10/18 0830 02/10/18 1147  TROPONINI 0.79* 0.59* 0.56*   No results for input(s): TROPIPOC in the last 168 hours.   BNPNo results for input(s): BNP, PROBNP in the last 168 hours.   DDimer No results for input(s): DDIMER in the last 168 hours.   Radiology    Dg Chest Port 1 View  Result Date: 02/15/2018 CLINICAL DATA:  Shortness of Breath EXAM: PORTABLE CHEST 1 VIEW COMPARISON:  February 09, 2018 FINDINGS: There is no edema or  consolidation. There is cardiomegaly with pulmonary venous hypertension. No adenopathy. No bone lesions. IMPRESSION: Pulmonary vascular congestion.  No evident edema or consolidation. Electronically Signed   By: Lowella Grip III M.D.   On: 02/15/2018 08:05    Cardiac Studies     Patient Profile     85 y.o. female   [place]     1.  Acute on chronic combined systolic and diastolic congestive heart failure: Patient has an ejection fraction of 15% by echo.  I have personally reviewed the echocardiogram.  LV and RV are markedly dilated. I suspect that she is on the far side of the [place] curve and I suspect that her creatinine will improve with diuresis. I do not think that we should proceed to heart catheterization with her creatinine rising. In addition, she has significant altered mental status and would not be a candidate for heart catheterization this morning. We will see if she responds to Lasix. Not a candidate for advanced heart failure therapies.  2.  Atrial flutter fibrillation:  3.  Acute on chronic renal insufficiency: Creatinine is now 2.5.  4.  Elevated troponin: I suspect this is due to congestive heart failure.         For questions or updates, please contact [place] Please consult [email or web address] for contact info under        Signed, Mertie Moores, MD  02/15/2018, 9:09 AM

## 2018-02-15 NOTE — Progress Notes (Signed)
[place] for heparin  Indication: chest pain/ACS  Allergies  Allergen Reactions  . Prednisone Other (See Comments)    hallucinations    Patient Measurements: Height: 5' (152.4 cm) Weight: 175 lb 0.7 oz (79.4 kg) IBW/kg (Calculated) : 45.5 Heparin Dosing Weight: 62kg  Vital Signs: Temp: 98.1 F (36.7 C) (01/20 0417) Temp Source: Axillary (01/20 0417) BP: 133/94 (01/20 0417) Pulse Rate: 128 (01/20 0417)  Labs: Recent Labs    02/13/18 0423 02/13/18 1414 02/14/18 0402 02/14/18 1628 02/15/18 0357  HGB 13.1  --  13.5  --  13.1  HCT 41.2  --  43.2  --  40.8  PLT 284  --  274  --  216  HEPARINUNFRC <0.10* 0.30 0.61  --  0.22*  CREATININE 1.67*  --  2.03* 2.31* 2.50*    Estimated Creatinine Clearance: 14.5 mL/min (A) (by C-G formula based on SCr of 2.5 mg/dL (H)).   Medical History: Past Medical History:  Diagnosis Date  . Arthritis   . Breast cancer ([place])    breast - left  . Chicken pox   . CKD (chronic kidney disease), stage IV ([place])   . GERD (gastroesophageal reflux disease)   . Gout   . Hepatitis    jaundice  . Hypertension   . Menopause   . Obesity   . Stroke [facility])    a. occult stroke noted on brain imaging 01/2018.  Marland Kitchen Venous insufficiency      Assessment: 85 yo female with afib/RVR and possible ACS on heparin. For possible cath today -heparin level below goal (the IV was out and heparin was off for some time)  Goal of Therapy:  Heparin level 0.3-0.7 units/ml Monitor platelets by anticoagulation protocol: Yes   Plan:  -No heparin changes now -Will follow plans post cath -Daily heparin level and CBC  Hildred Laser, PharmD Clinical Pharmacist **Pharmacist phone directory can now be found on [email or web address] ([email or web address]).  Listed under [place].

## 2018-02-16 ENCOUNTER — Encounter (HOSPITAL_COMMUNITY): Payer: Self-pay

## 2018-02-16 ENCOUNTER — Encounter (HOSPITAL_COMMUNITY)
Admission: EM | Disposition: A | Discharge status: Hospice | Payer: Self-pay | Source: Home / Self Care | Attending: Internal Medicine

## 2018-02-16 DIAGNOSIS — I5021 Acute systolic (congestive) heart failure: Secondary | ICD-10-CM

## 2018-02-16 DIAGNOSIS — Z7189 Other specified counseling: Secondary | ICD-10-CM

## 2018-02-16 DIAGNOSIS — Z515 Encounter for palliative care: Secondary | ICD-10-CM

## 2018-02-16 LAB — GLUCOSE, CAPILLARY: Glucose-Capillary: 145 mg/dL — ABNORMAL HIGH (ref 70–99)

## 2018-02-16 LAB — CBC
HCT: 42.9 % (ref 36.0–46.0)
Hemoglobin: 14 g/dL (ref 12.0–15.0)
MCH: 31.5 pg (ref 26.0–34.0)
MCHC: 32.6 g/dL (ref 30.0–36.0)
MCV: 96.6 fL (ref 80.0–100.0)
Platelets: 180 10*3/uL (ref 150–400)
RBC: 4.44 MIL/uL (ref 3.87–5.11)
RDW: 16.4 % — ABNORMAL HIGH (ref 11.5–15.5)
WBC: 13.1 10*3/uL — ABNORMAL HIGH (ref 4.0–10.5)
nRBC: 2.1 % — ABNORMAL HIGH (ref 0.0–0.2)

## 2018-02-16 LAB — BASIC METABOLIC PANEL
Anion gap: 13 (ref 5–15)
BUN: 72 mg/dL — ABNORMAL HIGH (ref 8–23)
CO2: 23 mmol/L (ref 22–32)
Calcium: 9.1 mg/dL (ref 8.9–10.3)
Chloride: 103 mmol/L (ref 98–111)
Creatinine, Ser: 2.14 mg/dL — ABNORMAL HIGH (ref 0.44–1.00)
GFR calc Af Amer: 23 mL/min — ABNORMAL LOW (ref >60)
GFR calc non Af Amer: 20 mL/min — ABNORMAL LOW (ref >60)
Glucose, Bld: 127 mg/dL — ABNORMAL HIGH (ref 70–99)
Potassium: 3.5 mmol/L (ref 3.5–5.1)
Sodium: 139 mmol/L (ref 135–145)

## 2018-02-16 LAB — HEPARIN LEVEL (UNFRACTIONATED): Heparin Unfractionated: 0.49 IU/mL (ref 0.30–0.70)

## 2018-02-16 LAB — MAGNESIUM: Magnesium: 1.9 mg/dL (ref 1.7–2.4)

## 2018-02-16 SURGERY — ECHOCARDIOGRAM, TRANSESOPHAGEAL
Anesthesia: Moderate Sedation

## 2018-02-16 MED ORDER — POTASSIUM CHLORIDE CRYS ER 20 MEQ PO TBCR: 60.0000 meq | EXTENDED_RELEASE_TABLET | Freq: Once | ORAL | Status: DC

## 2018-02-16 MED ORDER — POTASSIUM CHLORIDE CRYS ER 20 MEQ PO TBCR
40.0000 meq | EXTENDED_RELEASE_TABLET | Freq: Two times a day (BID) | ORAL | Status: DC
  Administered 2018-02-16 – 2018-02-18 (×5): 40 meq via ORAL
  Filled 2018-02-16 (×5): qty 2

## 2018-02-16 NOTE — Progress Notes (Signed)
[place] for heparin  Indication: chest pain/ACS  Allergies  Allergen Reactions  . Prednisone Other (See Comments)    hallucinations    Patient Measurements: Height: 5' (152.4 cm) Weight: 162 lb 14.7 oz (73.9 kg) IBW/kg (Calculated) : 45.5 Heparin Dosing Weight: 62kg  Vital Signs: Temp: 97.5 F (36.4 C) (01/21 0800) Temp Source: Oral (01/21 0800) BP: 99/73 (01/21 0800) Pulse Rate: 60 (01/21 0800)  Labs: Recent Labs    02/14/18 0402 02/14/18 1628 02/15/18 0357 02/16/18 0746 02/16/18 0821  HGB 13.5  --  13.1 14.0  --   HCT 43.2  --  40.8 42.9  --   PLT 274  --  216 180  --   HEPARINUNFRC 0.61  --  0.22*  --  0.49  CREATININE 2.03* 2.31* 2.50* 2.14*  --     Estimated Creatinine Clearance: 16.3 mL/min (A) (by C-G formula based on SCr of 2.14 mg/dL (H)).   Medical History: Past Medical History:  Diagnosis Date  . Arthritis   . Breast cancer ([place])    breast - left  . Chicken pox   . CKD (chronic kidney disease), stage IV ([place])   . GERD (gastroesophageal reflux disease)   . Gout   . Hepatitis    jaundice  . Hypertension   . Menopause   . Obesity   . Stroke [facility])    a. occult stroke noted on brain imaging 01/2018.  Marland Kitchen Venous insufficiency      Assessment: 85 yo female with afib/RVR and possible ACS on heparin. No cath now due to renal function. Noted palliative care seeing and may consider hospice.  -heparin at goal  Goal of Therapy:  Heparin level 0.3-0.7 units/ml Monitor platelets by anticoagulation protocol: Yes   Plan:  -No heparin changes now -Daily heparin level and CBC  Hildred Laser, PharmD Clinical Pharmacist **Pharmacist phone directory can now be found on [email or web address] ([email or web address]).  Listed under [place].

## 2018-02-16 NOTE — Consult Note (Signed)
[facility] CM Primary Care Navigator  02/16/2018  [place] 13-Jul-1927 [phone]    Met with patient, daughter [name]) and son in-law Hollice Espy) at thebedside to identify possible discharge needs.  Per daughter, patient presented with "increased confusion, hallucination and disorientation" and was found to have atrial fibrillation with rapid ventricular response along with persistent confusion in the ED, has been admitted to the hospital for vascular evaluation, cardiology was consulted.  Daughterendorses Dr. Maurice Small with [place] [facility] astheprimary care provider for patient.   [place] on [employer] (thru [employer]) Mail Order service to obtain medications withoutdifficulty.  Patient hasbeenmanagingherown medications at home straight out of the containers.  Patient hasbeendriving prior to hospitalization but daughter and son in-law will be able to provide transportation to herdoctors' appointments after discharge.  Daughter reports that patient had been independently living by herself prior to admission. Daughter and son in-law (live in [place]) have been visiting, calling patient daily and providing assistance to her when needed. Patient also has a neighbor Abigail Butts) who has been helping with her needs at home per daughter.   [place] uncertain at this time depending on clinical outcome per MD.  Daughter states awaiting for Palliative care consult to discussgoals of care. Daughter further mentioned that she would agree to comfort care if that is what patient needs for now. According to daughter, patient had voiced desire for non-aggressive measures and interventions during their recent conversation.  Patient's daughter voiced understanding to call primary care provider's office (if ever patient will be able to return back home)for a post discharge  follow-up visit within1- 2weeksor sooner if  needed. Patient letter (with PCP's contact number) was provided asa reminder.  Explained to patient regarding[facility] CM services available for healthmanagementand resourcesathomebut daughter states not being sure if patient will be able to go home at this point.  Patient's daughterverbalizedunderstandingof needto seekreferral from primary care provider to [facility] care management ifdeemed necessary and appropriatefor anyservicesin thefuture- if ever she willreturnto home.   [facility] care management information provided for future needs that patient may have.   Primary care provider's office is listed as providing transition of care (TOC) follow-up.    For additional questions please contact:  Edwena Felty A. Makiyla Linch, BSN, RN-BC [facility] PRIMARY CARE Navigator Cell: [phone]

## 2018-02-16 NOTE — Consult Note (Signed)
Consultation Note Date: 02/16/2018   Patient Name: Kendra Bray  DOB: 1927/12/10  MRN: [id number]  Age / Sex: 85 y.o., female  PCP: Dorena Cookey, MD Referring Physician: Aline August, MD  Reason for Consultation: Establishing goals of care  HPI/Patient Profile: Patient-year-old female with history of chronic kidney disease, breast cancer in remission, gout presented on 02/09/2018 after she was found to be confused.  She was found to have A. fib with RVR   Clinical Assessment and Goals of Care: Patient is resting in bed with no family at bedside. She is able to tell me her name, that she is in the hospital, the year, and that she is having issues with her heart. She states she lives alone. She is widowed x2. She has 2 children, 1 living and 1 deceased.   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 She does not go to church for evening services as she is unable to see to drive at night. She studies the Bible and other [place] books. She sometimes uses a cane since a fall when she was in her early 55's, but mostly does not use assistive devices. She has a good appetite.   We discussed her diagnosis, prognosis, GOC, EOL wishes disposition and options.  A detailed discussion was had today regarding advanced directives.  Concepts specific to code status, artifical feeding and hydration, IV antibiotics and rehospitalization were discussed.  The difference between an aggressive medical intervention path and a comfort care path was discussed.  Values and goals of care important to patient and family were attempted to be elicited.  Discussed limitations of medical interventions to prolong quality of life for this patient at this time in this situation and discussed the concept of human mortality.  She states as long as she is comfortable, she would want to continue treating the treatable and returning to the  hospital as needed. She states when she is no longer comfortable, she would want to focus just on comfort for what time she has. She states she would never want a feeding tube, dialysis, to be placed on a ventilator, or to have CPR                  ( confirming DNR on file).  She states her daughter Kendra Bray would be her decision maker if she were unable.       SUMMARY OF RECOMMENDATIONS   Continue to treat the treatable. If discharged at this time, recommend palliative to follow at D/C. Will continue to follow.    Code Status/Advance Care Planning:  DNR  Prognosis:   Poor longterm  Discharge Planning: To Be Determined      Primary Diagnoses: Present on Admission: . Atrial fibrillation with RVR ([place]) . Acute encephalopathy . CKD (chronic kidney disease), stage III ([place]) . Atrial fibrillation ([place])   I have reviewed the medical record, interviewed the patient and family, and examined the patient. The following aspects are pertinent.  Past Medical History:  Diagnosis Date  . Arthritis   .  Breast cancer ([place])    breast - left  . Chicken pox   . CKD (chronic kidney disease), stage IV ([place])   . GERD (gastroesophageal reflux disease)   . Gout   . Hepatitis    jaundice  . Hypertension   . Menopause   . Obesity   . Stroke [facility])    a. occult stroke noted on brain imaging 01/2018.  Marland Kitchen Venous insufficiency    Social History   Socioeconomic History  . Marital status: Widowed    Spouse name: Not on file  . Number of children: Not on file  . Years of education: Not on file  . Highest education level: Not on file  Occupational History  . Not on file  Social Needs  . Financial resource strain: Not on file  . Food insecurity:    Worry: Not on file    Inability: Not on file  . Transportation needs:    Medical: Not on file    Non-medical: Not on file  Tobacco Use  . Smoking status: Never Smoker  . Smokeless tobacco: Never Used  Substance and Sexual Activity  .  Alcohol use: No  . Drug use: No  . Sexual activity: Not on file  Lifestyle  . Physical activity:    Days per week: Not on file    Minutes per session: Not on file  . Stress: Not on file  Relationships  . Social connections:    Talks on phone: Not on file    Gets together: Not on file    Attends religious service: Not on file    Active member of club or organization: Not on file    Attends meetings of clubs or organizations: Not on file    Relationship status: Not on file  Other Topics Concern  . Not on file  Social History Narrative  . Not on file   Family History  Problem Relation Age of Onset  . Cancer Other   . Coronary artery disease Other    Scheduled Meds: . allopurinol  100 mg Oral Daily  . metoprolol tartrate  50 mg Oral TID  . potassium chloride  40 mEq Oral BID   Continuous Infusions: . furosemide 120 mg (02/16/18 1033)  . heparin 800 Units/hr (02/16/18 1254)   PRN Meds:.acetaminophen **OR** acetaminophen, ondansetron **OR** ondansetron (ZOFRAN) IV Medications Prior to Admission:  Prior to Admission medications   Medication Sig Start Date End Date Taking? Authorizing Provider  allopurinol (ZYLOPRIM) 100 MG tablet Take 10,200 mg by mouth daily.  04/19/15  Yes [provider]  aspirin 325 MG tablet Take 325 mg by mouth daily.   Yes [provider]  cholecalciferol (VITAMIN D) 1000 UNITS tablet Take 2,000 Units by mouth daily.    Yes [provider]  cyanocobalamin (,VITAMIN B-12,) 1000 MCG/ML injection Inject 1,000 mcg into the muscle every 30 (thirty) days. 11/16/17  Yes [provider]  famotidine (PEPCID) 10 MG tablet Take 10 mg by mouth daily as needed for heartburn or indigestion.   Yes [provider]  NON FORMULARY Stool softner   Yes [provider]   Allergies  Allergen Reactions  . Prednisone Other (See Comments)    hallucinations   Review of Systems  All other systems reviewed and are  negative.   Physical Exam Constitutional:      Appearance: She is well-developed.  Skin:    General: Skin is warm and dry.  Neurological:     Mental Status:  She is alert.     Comments: Alert and oriented.      Vital Signs: BP 103/74   Pulse 61   Temp (!) 97.5 F (36.4 C) (Oral)   Resp (!) 21   Ht 5' (1.524 m)   Wt 73.9 kg   SpO2 98%   BMI 31.82 kg/m  Pain Scale: 0-10   Pain Score: 5    SpO2: SpO2: 98 % O2 Device:SpO2: 98 % O2 Flow Rate: .O2 Flow Rate (L/min): 1 L/min  IO: Intake/output summary:   Intake/Output Summary (Last 24 hours) at 02/16/2018 1602 Last data filed at 02/16/2018 1431 Gross per 24 hour  Intake 1465.93 ml  Output 6300 ml  Net -4834.07 ml    LBM: Last BM Date: 02/16/18 Baseline Weight: Weight: 73.6 kg Most recent weight: Weight: 73.9 kg     Palliative Assessment/Data:     Flowsheet Rows     Most Recent Value  Intake Tab  Referral Department  Hospitalist  Unit at Time of Referral  Cardiac/Telemetry Unit  Palliative Care Primary Diagnosis  Cardiac  Date Notified  02/15/18  Palliative Care Type  New Palliative care  Reason for referral  Clarify Goals of Care  Date of Admission  02/09/18  # of days IP prior to Palliative referral  6  Clinical Assessment  Psychosocial & Spiritual Assessment  Palliative Care Outcomes      Time In: 3:15 Time Out: 4:15 Time Total: 60 min Greater than 50%  of this time was spent counseling and coordinating care related to the above assessment and plan.  Signed by: Asencion Gowda, NP   Please contact Palliative Medicine Team phone at [phone] for questions and concerns.  For individual provider: See Shea Evans

## 2018-02-16 NOTE — Progress Notes (Addendum)
Patient ID: Kendra Bray, female   DOB: [birth year], 85 y.o.   MRN: [id number]  PROGRESS NOTE    Kendra Bray  OAC:[id number] DOB: Oct 20, 1927 DOA: 02/09/2018 PCP: Dorena Cookey, MD   Brief Narrative:  Patient-year-old female with history of chronic kidney disease, breast cancer in remission, gout presented on 02/09/2018 after she was found to be confused.  She was found to have A. fib with RVR along with persistent confusion in the ED.  She was also found to have cooler right lower extremity.  She was transferred to [facility] for vascular evaluation.  Cardiology was also consulted.  She was initially started on Cardizem and heparin drips.   Assessment & Plan:   Principal Problem:   Atrial fibrillation with RVR (HCC) Active Problems:   BREAST CANCER, HX OF   Acute encephalopathy   CKD (chronic kidney disease), stage III (HCC)   Atrial fibrillation (HCC)   Typical atrial flutter (HCC)   Acute on chronic combined systolic and diastolic CHF (congestive heart failure) (HCC)  Paroxysmal atrial fibrillation with rapid ventricular rate -Cardiology following.  Initially started on Cardizem drip which has been changed to oral metoprolol.  Oral Cardizem was discontinued because of bradycardia and hypotension - continue heparin drip for now.  Acute metabolic encephalopathy -MRI of the brain was negative for acute infarct. -Monitor mental status.  Fall precautions. -Vitamin B12 absorption limits.  TSH slightly elevated but normal T4 and T3. -Chest x-ray was negative for infection, UA was negative. -Mental status has much improved today and probably is back to baseline.   Acute systolic heart failure -Echo showed EF of 15 to 20% with diffuse hypokinesis.  Strict input and output.  Daily weights.   -Patient received intravenous Lasix on 02/14/2018 and made very little urine output.  She was subsequently started on bicarb drip. Currently on Lasix drip as per cardiology with good  urine output.  Follow further cardiology recommendations -Cardiac catheterization plan on hold for now.  Mildly elevated troponins -Most likely secondary to demand ischemia from above.  Troponins did not trend up.  Right lower extremity ischemia was ruled out -Vascular surgery evaluation appreciated.  No evidence of occlusive arterial disease on imaging.  No further work-up needed as per vascular surgery.  Acute kidney injury on chronic kidney disease stage III with metabolic acidosis -Creatinine 2.14  today.     Prior creatinine 1.6-2 -Diuretic plan as above.  Bicarb has improved, discontinue bicarb drip -Monitor  Leukocytosis -Probably reactive.  Monitor  Generalized deconditioning -Overall prognosis is guarded to poor.  Palliative care has been consulted.  If condition worsens, consider hospice/comfort measures  DVT prophylaxis: Heparin Code Status: DNR.   Family Communication: Daughter at bedside Disposition Plan: Depends on clinical outcome  Consultants: Cardiology/vascular surgery  Procedures:  ECHO 02/11/2018 - Left ventricle: The cavity size was moderately dilated. Wall thickness was increased in a pattern of mild LVH. Systolic function was severely reduced. The estimated ejection fraction was in the range of 15% to 20%. Diffuse hypokinesis. - Mitral valve: Mildly calcified annulus. Mildly thickened, mildly calcified leaflets . There was moderate regurgitation. - Left atrium: The atrium was moderately dilated. - Right ventricle: The cavity size was moderately dilated. Systolic function was moderately reduced. - Tricuspid valve: There was mild-moderate regurgitation. - Pulmonary arteries: Systolic pressure was moderately increased. PA peak pressure: 51 mm Hg (S).  Antimicrobials: None   Subjective: Patient seen and examined at bedside.  No overnight fever or vomiting reported.  She is more awake and answers some questions but is a poor historian.   Pleasantly confused.  Objective: Vitals:   02/15/18 1700 02/15/18 2004 02/16/18 0505 02/16/18 0800  BP: 112/85 117/81 (!) 109/57 99/73  Pulse: (!) 132 (!) 130 (!) 103 60  Resp:  19 18 (!) 21  Temp: 97.7 F (36.5 C)  97.7 F (36.5 C) (!) 97.5 F (36.4 C)  TempSrc: Axillary  Axillary Oral  SpO2: 96% 100%  98%  Weight:   73.9 kg   Height:        Intake/Output Summary (Last 24 hours) at 02/16/2018 1023 Last data filed at 02/16/2018 0700 Gross per 24 hour  Intake 1465.93 ml  Output 4800 ml  Net -3334.07 ml   Filed Weights   02/14/18 0407 02/15/18 0417 02/16/18 0505  Weight: 77.3 kg 79.4 kg 73.9 kg    Examination:  General exam: Elderly female lying in bed.  No  distress.  More awake.  Pleasantly confused respiratory system: Bilateral decreased breath sounds at bases with bibasilar crackles, no wheezing cardiovascular system: S1 & S2 heard, tachycardic intermittently Gastrointestinal system: Abdomen is nondistended, soft and nontender. Normal bowel sounds heard. Extremities: No cyanosis; trace edema   Data Reviewed: I have personally reviewed following labs and imaging studies  CBC: Recent Labs  Lab 02/12/18 0359 02/13/18 0423 02/14/18 0402 02/15/18 0357 02/16/18 0746  WBC 12.8* 11.8* 14.6* 14.5* 13.1*  HGB 11.9* 13.1 13.5 13.1 14.0  HCT 37.2 41.2 43.2 40.8 42.9  MCV 97.1 101.2* 102.4* 99.3 96.6  PLT 248 284 274 216 409   Basic Metabolic Panel: Recent Labs  Lab 02/12/18 0359 02/13/18 0423 02/14/18 0402 02/14/18 1628 02/15/18 0357 02/16/18 0746  NA 134* 136 138 137 138 139  K 4.4 4.8 5.7* 6.0* 4.1 3.5  CL 109 111 113* 115* 112* 103  CO2 18* 13* 13* 8* 12* 23  GLUCOSE 129* 163* 142* 138* 178* 127*  BUN 61* 60* 63* 76* 79* 72*  CREATININE 1.92* 1.67* 2.03* 2.31* 2.50* 2.14*  CALCIUM 9.6 9.8 9.6 9.8 9.3 9.1  MG 2.2 2.3 2.4  --  2.2 1.9   GFR: Estimated Creatinine Clearance: 16.3 mL/min (A) (by C-G formula based on SCr of 2.14 mg/dL (H)). Liver Function  Tests: Recent Labs  Lab 02/09/18 1642 02/11/18 0500 02/12/18 0359  AST 41 65* 41  ALT 56* 99* 86*  ALKPHOS 80 82 81  BILITOT 1.1 1.0 0.9  PROT 6.8 5.8* 5.4*  ALBUMIN 4.1 3.3* 3.2*   No results for input(s): LIPASE, AMYLASE in the last 168 hours. Recent Labs  Lab 02/10/18 1030  AMMONIA 27   Coagulation Profile: Recent Labs  Lab 02/12/18 0359  INR 1.18   Cardiac Enzymes: Recent Labs  Lab 02/09/18 1633 02/10/18 0830 02/10/18 1147  TROPONINI 0.79* 0.59* 0.56*   BNP (last 3 results) No results for input(s): PROBNP in the last 8760 hours. HbA1C: No results for input(s): HGBA1C in the last 72 hours. CBG: Recent Labs  Lab 02/09/18 1702  GLUCAP 138*   Lipid Profile: No results for input(s): CHOL, HDL, LDLCALC, TRIG, CHOLHDL, LDLDIRECT in the last 72 hours. Thyroid Function Tests: No results for input(s): TSH, T4TOTAL, FREET4, T3FREE, THYROIDAB in the last 72 hours. Anemia Panel: No results for input(s): VITAMINB12, FOLATE, FERRITIN, TIBC, IRON, RETICCTPCT in the last 72 hours. Sepsis Labs: Recent Labs  Lab 02/09/18 1637 02/09/18 2038  LATICACIDVEN 2.46* 1.80    Recent Results (from the past 240 hour(s))  Blood culture (  routine x 2)     Status: None   Collection Time: 02/09/18  4:38 PM  Result Value Ref Range Status   Specimen Description   Final    BLOOD RIGHT HAND Performed at [facility] [address], [place], [place] [address]    Special Requests   Final    BOTTLES DRAWN AEROBIC AND ANAEROBIC Blood Culture results may not be optimal due to an inadequate volume of blood received in culture bottles Performed at [facility] [address] [place], [place] [address]    Culture   Final    NO GROWTH 5 DAYS Performed at [facility], [place] [address] [place], [place] [address]    Report Status 02/15/2018 FINAL  Final  Blood culture (routine x 2)     Status: None   Collection Time: 02/09/18  4:42 PM    Result Value Ref Range Status   Specimen Description   Final    BLOOD RIGHT ANTECUBITAL Performed at [facility] [address] [place], [place] [address]    Special Requests   Final    BOTTLES DRAWN AEROBIC AND ANAEROBIC Blood Culture results may not be optimal due to an excessive volume of blood received in culture bottles Performed at [facility] [address], [place], [place] [address]    Culture   Final    NO GROWTH 5 DAYS Performed at [facility], [place] [address], [place],  [address]    Report Status 02/14/2018 FINAL  Final         Radiology Studies: Dg Chest Port 1 View  Result Date: 02/15/2018 CLINICAL DATA:  Shortness of Breath EXAM: PORTABLE CHEST 1 VIEW COMPARISON:  February 09, 2018 FINDINGS: There is no edema or consolidation. There is cardiomegaly with pulmonary venous hypertension. No adenopathy. No bone lesions. IMPRESSION: Pulmonary vascular congestion.  No evident edema or consolidation. Electronically Signed   By: Lowella Grip III M.D.   On: 02/15/2018 08:05        Scheduled Meds: . allopurinol  100 mg Oral Daily  . metoprolol tartrate  50 mg Oral TID  . potassium chloride  60 mEq Oral Once   Continuous Infusions: . furosemide Stopped (02/15/18 2207)  . heparin 800 Units/hr (02/16/18 0300)     LOS: 7 days        Aline August, MD [facility] Pager [phone]  If 7PM-7AM, please contact night-coverage [email or web address] Password [facility] 02/16/2018, 10:23 AM

## 2018-02-16 NOTE — Progress Notes (Addendum)
Progress Note  Patient Name: Kendra Bray Date of Encounter: 02/16/2018  Primary Cardiologist: Sanda Klein, MD    Subjective   85 year old female with a history of chronic kidney disease, hypertension, old stroke, breast cancer, obesity who is seen for further evaluation of atrial fibrillation with a rapid ventricular response and congestive heart failure.  Echocardiogram from January 15 reveals severely depressed left ventricular systolic function with an ejection fraction of 15 to 20%.  She has diffuse hypokinesis.  She has moderate pulmonary hypertension with an estimated PA pressure of 51 mmHg.  She is more awake and alert today. Responding fairly well to the Lasix started yesterday.  Potassium is a little low.  We will supplement her potassium   Inpatient Medications    Scheduled Meds: . allopurinol  100 mg Oral Daily  . metoprolol tartrate  50 mg Oral TID  . potassium chloride  60 mEq Oral Once   Continuous Infusions: . furosemide Stopped (02/15/18 2207)  . heparin 800 Units/hr (02/16/18 0300)   PRN Meds: acetaminophen **OR** acetaminophen, ondansetron **OR** ondansetron (ZOFRAN) IV   Vital Signs    Vitals:   02/15/18 2004 02/16/18 0505 02/16/18 0800 02/16/18 1028  BP: 117/81 (!) 109/57 99/73 103/74  Pulse: (!) 130 (!) 103 60 61  Resp: 19 18 (!) 21   Temp:  97.7 F (36.5 C) (!) 97.5 F (36.4 C)   TempSrc:  Axillary Oral   SpO2: 100%  98%   Weight:  73.9 kg    Height:        Intake/Output Summary (Last 24 hours) at 02/16/2018 1030 Last data filed at 02/16/2018 0700 Gross per 24 hour  Intake 1465.93 ml  Output 4800 ml  Net -3334.07 ml   Last 3 Weights 02/16/2018 02/15/2018 02/14/2018  Weight (lbs) 162 lb 14.7 oz 175 lb 0.7 oz 170 lb 8 oz  Weight (kg) 73.9 kg 79.4 kg 77.338 kg      Telemetry    Atrial fibrillation with rapid ventricular response- Personally Reviewed  ECG    Fibrillation with rapid ventricular response- Personally  Reviewed  Physical Exam   GEN: Elderly female, much more awake and alert today.  Was able to answer simple questions.  Moderate obesity Neck: No JVD Cardiac:  Early irregular.  Her heart rate is tachycardic. Respiratory: Clear to auscultation bilaterally. GI: Soft, nontender, non-distended  MS:  No edema. Neuro:   Able to answer simple questions. Psych: Able to answer simple questions.  Labs    Chemistry Recent Labs  Lab 02/09/18 1642  02/11/18 0500 02/12/18 0359  02/14/18 1628 02/15/18 0357 02/16/18 0746  NA 140   < > 139 134*   < > 137 138 139  K 4.3   < > 5.0 4.4   < > 6.0* 4.1 3.5  CL 108   < > 112* 109   < > 115* 112* 103  CO2 19*   < > 15* 18*   < > 8* 12* 23  GLUCOSE 147*   < > 128* 129*   < > 138* 178* 127*  BUN 54*   < > 57* 61*   < > 76* 79* 72*  CREATININE 2.02*   < > 1.81* 1.92*   < > 2.31* 2.50* 2.14*  CALCIUM 10.3   < > 8.9 9.6   < > 9.8 9.3 9.1  PROT 6.8  --  5.8* 5.4*  --   --   --   --   ALBUMIN 4.1  --  3.3* 3.2*  --   --   --   --   AST 41  --  65* 41  --   --   --   --   ALT 56*  --  99* 86*  --   --   --   --   ALKPHOS 80  --  82 81  --   --   --   --   BILITOT 1.1  --  1.0 0.9  --   --   --   --   GFRNONAA 21*   < > 25* 23*   < > 18* 17* 20*  GFRAA 25*   < > 28* 26*   < > 21* 19* 23*  ANIONGAP 13   < > 12 7   < > 14 14 13    < > = values in this interval not displayed.     Hematology Recent Labs  Lab 02/14/18 0402 02/15/18 0357 02/16/18 0746  WBC 14.6* 14.5* 13.1*  RBC 4.22 4.11 4.44  HGB 13.5 13.1 14.0  HCT 43.2 40.8 42.9  MCV 102.4* 99.3 96.6  MCH 32.0 31.9 31.5  MCHC 31.3 32.1 32.6  RDW 16.1* 16.5* 16.4*  PLT 274 216 180    Cardiac Enzymes Recent Labs  Lab 02/09/18 1633 02/10/18 0830 02/10/18 1147  TROPONINI 0.79* 0.59* 0.56*   No results for input(s): TROPIPOC in the last 168 hours.   BNPNo results for input(s): BNP, PROBNP in the last 168 hours.   DDimer No results for input(s): DDIMER in the last 168 hours.    Radiology    Dg Chest Port 1 View  Result Date: 02/15/2018 CLINICAL DATA:  Shortness of Breath EXAM: PORTABLE CHEST 1 VIEW COMPARISON:  February 09, 2018 FINDINGS: There is no edema or consolidation. There is cardiomegaly with pulmonary venous hypertension. No adenopathy. No bone lesions. IMPRESSION: Pulmonary vascular congestion.  No evident edema or consolidation. Electronically Signed   By: Lowella Grip III M.D.   On: 02/15/2018 08:05    Cardiac Studies     Patient Profile     85 y.o. female   [place]     1.  Acute on chronic combined systolic and diastolic congestive heart failure: Patient has an ejection fraction of 15% by echo.  I have personally reviewed the echocardiogram.  LV and RV are markedly dilated. I suspect that she is on the far side of the [place] curve and I suspect that her creatinine will improve with diuresis. Is improved with diuresis.  She seems to be more awake and alert today.  Renal function appears to be improving slightly.  Continue with current Lasix and potassium dosing.  2.  Atrial flutter fibrillation: remains  tachycardic.  Blood pressures have  been marginal.  3.  Acute on chronic renal insufficiency: Right and has improved slightly.  4.  Elevated troponin: she is currently not a candidate for heart catheterization.  The troponin elevations appear to be consistent with demand ischemia.      For questions or updates, please contact [place] Please consult [email or web address] for contact info under        Signed, Mertie Moores, MD  02/16/2018, 10:30 AM

## 2018-02-16 NOTE — Care Management Important Message (Signed)
Important Message  Patient Details  Name: Kendra Bray MRN: [id number] Date of Birth: [birth year]   Medicare Important Message Given:  Yes    Charolette Bultman P Covey Baller 02/16/2018, 1:21 PM

## 2018-02-17 ENCOUNTER — Other Ambulatory Visit: Payer: Self-pay

## 2018-02-17 DIAGNOSIS — Z853 Personal history of malignant neoplasm of breast: Secondary | ICD-10-CM

## 2018-02-17 LAB — CBC
HCT: 44.3 % (ref 36.0–46.0)
Hemoglobin: 14.7 g/dL (ref 12.0–15.0)
MCH: 31.7 pg (ref 26.0–34.0)
MCHC: 33.2 g/dL (ref 30.0–36.0)
MCV: 95.7 fL (ref 80.0–100.0)
Platelets: 186 10*3/uL (ref 150–400)
RBC: 4.63 MIL/uL (ref 3.87–5.11)
RDW: 16.3 % — ABNORMAL HIGH (ref 11.5–15.5)
WBC: 14.1 10*3/uL — ABNORMAL HIGH (ref 4.0–10.5)
nRBC: 0.9 % — ABNORMAL HIGH (ref 0.0–0.2)

## 2018-02-17 LAB — BASIC METABOLIC PANEL
Anion gap: 14 (ref 5–15)
BUN: 61 mg/dL — ABNORMAL HIGH (ref 8–23)
CHLORIDE: 101 mmol/L (ref 98–111)
CO2: 29 mmol/L (ref 22–32)
Calcium: 9.6 mg/dL (ref 8.9–10.3)
Creatinine, Ser: 1.78 mg/dL — ABNORMAL HIGH (ref 0.44–1.00)
GFR calc Af Amer: 29 mL/min — ABNORMAL LOW (ref >60)
GFR calc non Af Amer: 25 mL/min — ABNORMAL LOW (ref >60)
Glucose, Bld: 116 mg/dL — ABNORMAL HIGH (ref 70–99)
POTASSIUM: 3 mmol/L — AB (ref 3.5–5.1)
Sodium: 144 mmol/L (ref 135–145)

## 2018-02-17 LAB — HEPARIN LEVEL (UNFRACTIONATED): Heparin Unfractionated: 0.45 IU/mL (ref 0.30–0.70)

## 2018-02-17 LAB — MAGNESIUM: Magnesium: 1.7 mg/dL (ref 1.7–2.4)

## 2018-02-17 MED ORDER — MAGNESIUM SULFATE 2 GM/50ML IV SOLN
2.0000 g | Freq: Once | INTRAVENOUS | Status: AC
  Administered 2018-02-17: 2 g via INTRAVENOUS
  Filled 2018-02-17: qty 50

## 2018-02-17 MED ORDER — AMIODARONE LOAD VIA INFUSION
150.0000 mg | Freq: Once | INTRAVENOUS | Status: AC
  Administered 2018-02-17: 150 mg via INTRAVENOUS
  Filled 2018-02-17: qty 83.34

## 2018-02-17 MED ORDER — ALUM & MAG HYDROXIDE-SIMETH 200-200-20 MG/5ML PO SUSP
15.0000 mL | ORAL | Status: DC | PRN
  Administered 2018-02-17: 15 mL via ORAL
  Filled 2018-02-17 (×2): qty 30

## 2018-02-17 MED ORDER — AMIODARONE HCL IN DEXTROSE 360-4.14 MG/200ML-% IV SOLN
60.0000 mg/h | INTRAVENOUS | Status: DC
  Administered 2018-02-17 (×2): 60 mg/h via INTRAVENOUS
  Filled 2018-02-17 (×2): qty 200

## 2018-02-17 MED ORDER — HEPARIN SODIUM (PORCINE) 5000 UNIT/ML IJ SOLN
5000.0000 [IU] | Freq: Three times a day (TID) | INTRAMUSCULAR | Status: DC
  Administered 2018-02-17 – 2018-02-23 (×18): 5000 [IU] via SUBCUTANEOUS
  Filled 2018-02-17 (×18): qty 1

## 2018-02-17 MED ORDER — AMIODARONE HCL IN DEXTROSE 360-4.14 MG/200ML-% IV SOLN
30.0000 mg/h | INTRAVENOUS | Status: DC
  Administered 2018-02-18: 30 mg/h via INTRAVENOUS
  Filled 2018-02-17: qty 200

## 2018-02-17 MED ORDER — SPIRONOLACTONE 12.5 MG HALF TABLET
12.5000 mg | ORAL_TABLET | Freq: Every day | ORAL | Status: DC
  Administered 2018-02-17 – 2018-02-19 (×3): 12.5 mg via ORAL
  Filled 2018-02-17 (×4): qty 1

## 2018-02-17 NOTE — Progress Notes (Signed)
PROGRESS NOTE    Kendra Bray [facility]  WIO:[id number] DOB: 12-20-1927 DOA: 02/09/2018 PCP: Dorena Cookey, MD   Brief Narrative:  HPI on 02/09/2018 by Dr. Gean Birchwood Kendra Bray is a 85 y.o. female with history of chronic kidney disease, breast cancer in remission, gout was brought to the ER after patient was found to be confused.  As per the family who discussed with the ER physician patient has not been able to be reached for the last 4 days and fashion the daughter had arrived from [place] to check on the patient.  Patient was found to be confused and was brought to the ER.  As per the family patient also was having some hallucinations.  Patient did not complain of any headache chest pain or shortness of breath nausea vomiting or diarrhea.  Interim history Patient was admitted with confusion found to have atrial fibrillation with RVR.  She was also found to have a cool right lower extremity and transferred to [facility] for vascular evaluation.  Cardiology was also consulted as patient had systolic heart failure exacerbation. Assessment & Plan   Paroxysmal atrial fibrillation with RVR -Cardiology consulted and appreciated -Patient was started on Cardizem drip initially however this was transitioned to oral metoprolol -Patient noted to have hypotension and bradycardia on oral Cardizem -Discussed at length today with Dr. Acie Fredrickson, cardiology, given her current status, age, limited mobility, patient likely not able to have frequent INR checks in the outpatient setting, renal function will not allow for newer medications. -will hold heparin drip and anticoagulation for now as patient currently is in sinus rhythm  Acute metabolic encephalopathy -Appears to be at baseline today -MRI brain was negative for acute infarct -Vitamin B12 2568 -TSH was slightly elevated however normal T3 and T4 -Chest x-ray and UA unremarkable for infection  Acute systolic heart failure -Echocardiogram  showed an EF of 15 to 20% with diffuse hypokinesis. -As above, cardiology consulted and appreciated -Continue to monitor intake and output, daily weights -Was given IV Lasix on 02/14/2018 and made little urine output, she was subsequently placed on bicarb drip.  Currently on Lasix drip per cardiology with good urine output. -Cardiac catheterization plan on hold for now  Mildly elevated troponins -Most likely secondary to demand ischemia  Right lower extremity ischemia -Ruled out -Vascular surgery did not appreciated.  No evidence of occlusive arterial disease on imaging.  No further work-up needed as per vascular surgery  Acute kidney injury on chronic kidney disease, stage III with metabolic acidosis -Baseline creatinine approximately 1.6-2 -Currently on IV Lasix.  Bicarb drip was discontinued -Creatinine currently 1.78, continue to monitor BMP  Hypokalemia -Will replace and continue to monitor BMP -Likely secondary to Lasix -Magnesium 1.7, will replace  Leukocytosis -Reactive to the above, continue to monitor CBC  Generalized deconditioning -Overall prognosis is guarded to poor.  Palliative care has been consulted. -Patient currently DNR.  Will continue to treat the treatable at this time.  On discharge, recommend palliative care to follow  DVT Prophylaxis  Heparin discontinued today, will place on DVT proph dose  Code Status: DNR  Family Communication: None at bedside  Disposition Plan: Admitted. Continues to be on lasix drip. Pending further cardiology recommendations. dispo TBD  Consultants Cardiology Vascular surgery Palliative care  Procedures  Echocardiogram Lower extremity Doppler, ABI  Antibiotics   Anti-infectives (From admission, onward)   None      Subjective:   [place] seen and examined today.  Patient has no complaints  this morning.  Feels her breathing has improved.  Denies chest pain, abdominal pain, nausea or vomiting, diarrhea or  constipation, dizziness or headache.  Objective:   Vitals:   02/17/18 0055 02/17/18 0534 02/17/18 0814 02/17/18 1216  BP:  (!) 144/83 109/70 120/70  Pulse: 61 71 69 70  Resp: 18 18 19 20   Temp:  97.9 F (36.6 C) 98 F (36.7 C) (!) 97.4 F (36.3 C)  TempSrc:  Oral Oral Oral  SpO2:  99% 99% 98%  Weight:  68.6 kg    Height:        Intake/Output Summary (Last 24 hours) at 02/17/2018 1400 Last data filed at 02/17/2018 0645 Gross per 24 hour  Intake 607.32 ml  Output 2650 ml  Net -2042.68 ml   Filed Weights   02/15/18 0417 02/16/18 0505 02/17/18 0534  Weight: 79.4 kg 73.9 kg 68.6 kg    Exam  General: Well developed, chronically ill appearing, NAD  HEENT: NCAT, mucous membranes moist.   Neck: Supple  Cardiovascular: S1 S2 auscultated, RRR, no murmur  Respiratory: Clear to auscultation bilaterally with equal chest rise  Abdomen: Soft, nontender, nondistended, + bowel sounds  Extremities: warm dry without cyanosis clubbing or edema  Neuro: AAOx3, nonfocal  Psych: Normal affect and demeanor    Data Reviewed: I have personally reviewed following labs and imaging studies  CBC: Recent Labs  Lab 02/13/18 0423 02/14/18 0402 02/15/18 0357 02/16/18 0746 02/17/18 0412  WBC 11.8* 14.6* 14.5* 13.1* 14.1*  HGB 13.1 13.5 13.1 14.0 14.7  HCT 41.2 43.2 40.8 42.9 44.3  MCV 101.2* 102.4* 99.3 96.6 95.7  PLT 284 274 216 180 161   Basic Metabolic Panel: Recent Labs  Lab 02/13/18 0423 02/14/18 0402 02/14/18 1628 02/15/18 0357 02/16/18 0746 02/17/18 0412  NA 136 138 137 138 139 144  K 4.8 5.7* 6.0* 4.1 3.5 3.0*  CL 111 113* 115* 112* 103 101  CO2 13* 13* 8* 12* 23 29  GLUCOSE 163* 142* 138* 178* 127* 116*  BUN 60* 63* 76* 79* 72* 61*  CREATININE 1.67* 2.03* 2.31* 2.50* 2.14* 1.78*  CALCIUM 9.8 9.6 9.8 9.3 9.1 9.6  MG 2.3 2.4  --  2.2 1.9 1.7   GFR: Estimated Creatinine Clearance: 18.9 mL/min (A) (by C-G formula based on SCr of 1.78 mg/dL (H)). Liver Function  Tests: Recent Labs  Lab 02/11/18 0500 02/12/18 0359  AST 65* 41  ALT 99* 86*  ALKPHOS 82 81  BILITOT 1.0 0.9  PROT 5.8* 5.4*  ALBUMIN 3.3* 3.2*   No results for input(s): LIPASE, AMYLASE in the last 168 hours. No results for input(s): AMMONIA in the last 168 hours. Coagulation Profile: Recent Labs  Lab 02/12/18 0359  INR 1.18   Cardiac Enzymes: No results for input(s): CKTOTAL, CKMB, CKMBINDEX, TROPONINI in the last 168 hours. BNP (last 3 results) No results for input(s): PROBNP in the last 8760 hours. HbA1C: No results for input(s): HGBA1C in the last 72 hours. CBG: Recent Labs  Lab 02/16/18 1119  GLUCAP 145*   Lipid Profile: No results for input(s): CHOL, HDL, LDLCALC, TRIG, CHOLHDL, LDLDIRECT in the last 72 hours. Thyroid Function Tests: No results for input(s): TSH, T4TOTAL, FREET4, T3FREE, THYROIDAB in the last 72 hours. Anemia Panel: No results for input(s): VITAMINB12, FOLATE, FERRITIN, TIBC, IRON, RETICCTPCT in the last 72 hours. Urine analysis:    Component Value Date/Time   COLORURINE YELLOW 02/09/2018 2003   APPEARANCEUR CLEAR 02/09/2018 2003   LABSPEC 1.019 02/09/2018 2003  PHURINE 5.0 02/09/2018 2003   GLUCOSEU NEGATIVE 02/09/2018 2003   HGBUR NEGATIVE 02/09/2018 2003   HGBUR negative 02/14/2010 1317   BILIRUBINUR NEGATIVE 02/09/2018 2003   BILIRUBINUR n 02/28/2013 1112   KETONESUR 5 (A) 02/09/2018 2003   PROTEINUR NEGATIVE 02/09/2018 2003   UROBILINOGEN 0.2 02/28/2013 1112   UROBILINOGEN 0.2 02/14/2010 1317   NITRITE NEGATIVE 02/09/2018 2003   LEUKOCYTESUR NEGATIVE 02/09/2018 2003   Sepsis Labs: @LABRCNTIP (procalcitonin:4,lacticidven:4)  ) Recent Results (from the past 240 hour(s))  Blood culture (routine x 2)     Status: None   Collection Time: 02/09/18  4:38 PM  Result Value Ref Range Status   Specimen Description   Final    BLOOD RIGHT HAND Performed at [facility], [place] [address], [place], [place] [address]     Special Requests   Final    BOTTLES DRAWN AEROBIC AND ANAEROBIC Blood Culture results may not be optimal due to an inadequate volume of blood received in culture bottles Performed at [place] [address] [place], [place] [address]    Culture   Final    NO GROWTH 5 DAYS Performed at [facility], [place] [address] [place], [place] [address]    Report Status 02/15/2018 FINAL  Final  Blood culture (routine x 2)     Status: None   Collection Time: 02/09/18  4:42 PM  Result Value Ref Range Status   Specimen Description   Final    BLOOD RIGHT ANTECUBITAL Performed at [place] [address], [place], [place] [address]    Special Requests   Final    BOTTLES DRAWN AEROBIC AND ANAEROBIC Blood Culture results may not be optimal due to an excessive volume of blood received in culture bottles Performed at [place] [address] [place], [place] [address]    Culture   Final    NO GROWTH 5 DAYS Performed at [facility], [place] [address] [place], [place] [address]    Report Status 02/14/2018 FINAL  Final      Radiology Studies: No results found.   Scheduled Meds: . allopurinol  100 mg Oral Daily  . metoprolol tartrate  50 mg Oral TID  . potassium chloride  40 mEq Oral BID  . spironolactone  12.5 mg Oral Daily   Continuous Infusions: . furosemide 120 mg (02/17/18 1041)     LOS: 8 days   Time Spent in minutes   45 minutes (greater than 50% of time spent with patient face to face, as well as reviewing records and discussing with consultants, and formulating a plan)   Cristal Ford D.O. on 02/17/2018 at 2:00 PM  Between 7am to 7pm - Please see pager noted on [email or web address]  After 7pm go to [email or web address]  And look for the night coverage person covering for me after hours  [facility] Office  [phone]

## 2018-02-17 NOTE — Progress Notes (Signed)
[place] for heparin  Indication: chest pain/ACS  Allergies  Allergen Reactions  . Prednisone Other (See Comments)    hallucinations    Patient Measurements: Height: 5' (152.4 cm) Weight: 151 lb 3.8 oz (68.6 kg) IBW/kg (Calculated) : 45.5 Heparin Dosing Weight: 62kg  Vital Signs: Temp: 98 F (36.7 C) (01/22 0814) Temp Source: Oral (01/22 0814) BP: 109/70 (01/22 0814) Pulse Rate: 69 (01/22 0814)  Labs: Recent Labs    02/15/18 0357 02/16/18 0746 02/16/18 0821 02/17/18 0412  HGB 13.1 14.0  --  14.7  HCT 40.8 42.9  --  44.3  PLT 216 180  --  186  HEPARINUNFRC 0.22*  --  0.49 0.45  CREATININE 2.50* 2.14*  --  1.78*    Estimated Creatinine Clearance: 18.9 mL/min (A) (by C-G formula based on SCr of 1.78 mg/dL (H)).   Medical History: Past Medical History:  Diagnosis Date  . Arthritis   . Breast cancer ([place])    breast - left  . Chicken pox   . CKD (chronic kidney disease), stage IV ([place])   . GERD (gastroesophageal reflux disease)   . Gout   . Hepatitis    jaundice  . Hypertension   . Menopause   . Obesity   . Stroke [facility])    a. occult stroke noted on brain imaging 01/2018.  [place] Venous insufficiency      Assessment: 85 yo female with afib/RVR and possible ACS on heparin. No cath now due to renal function. Palliative care plans are to continue treatment.  -heparin at goal  Goal of Therapy:  Heparin level 0.3-0.7 units/ml Monitor platelets by anticoagulation protocol: Yes   Plan:  -No heparin changes -Daily heparin level and CBC  Hildred Laser, PharmD Clinical Pharmacist **Pharmacist phone directory can now be found on [email or web address] ([email or web address]).  Listed under [place].

## 2018-02-17 NOTE — Progress Notes (Addendum)
Progress Note  Patient Name: Kendra Bray Date of Encounter: 02/17/2018  Primary Cardiologist: Sanda Klein, MD    Subjective   85 year old female with a history of chronic kidney disease, hypertension, old stroke, breast cancer, obesity who is seen for further evaluation of atrial fibrillation with a rapid ventricular response and congestive heart failure.  Echocardiogram from January 15 reveals severely depressed left ventricular systolic function with an ejection fraction of 15 to 20%.  She has diffuse hypokinesis.  She has moderate pulmonary hypertension with an estimated PA pressure of 51 mmHg.  She continues to diurese.  She has been seen by palliative care.   Inpatient Medications    Scheduled Meds: . allopurinol  100 mg Oral Daily  . metoprolol tartrate  50 mg Oral TID  . potassium chloride  40 mEq Oral BID   Continuous Infusions: . furosemide Stopped (02/16/18 2311)  . heparin 800 Units/hr (02/17/18 0645)   PRN Meds: acetaminophen **OR** acetaminophen, ondansetron **OR** ondansetron (ZOFRAN) IV   Vital Signs    Vitals:   02/16/18 2152 02/17/18 0055 02/17/18 0534 02/17/18 0814  BP: 132/79  (!) 144/83 109/70  Pulse: 67 61 71 69  Resp: 15 18 18 19   Temp:   97.9 F (36.6 C) 98 F (36.7 C)  TempSrc:   Oral Oral  SpO2: 94%  99% 99%  Weight:   68.6 kg   Height:        Intake/Output Summary (Last 24 hours) at 02/17/2018 1003 Last data filed at 02/17/2018 0645 Gross per 24 hour  Intake 607.32 ml  Output 3250 ml  Net -2642.68 ml   Last 3 Weights 02/17/2018 02/16/2018 02/15/2018  Weight (lbs) 151 lb 3.8 oz 162 lb 14.7 oz 175 lb 0.7 oz  Weight (kg) 68.6 kg 73.9 kg 79.4 kg      Telemetry    Normal sinus rhythm- Personally Reviewed  ECG      Physical Exam   Physical Exam: Blood pressure 109/70, pulse 69, temperature 98 F (36.7 C), temperature source Oral, resp. rate 19, height 5' (1.524 m), weight 68.6 kg, SpO2 99 %.  GEN: Chronically  ill-appearing female, fairly weak.  Lying in bed. HEENT: Normal NECK: No JVD; No carotid bruits LYMPHATICS: No lymphadenopathy CARDIAC: Rate S1-S2. RESPIRATORY:  Clear to auscultation without rales, wheezing or rhonchi  ABDOMEN: Soft, non-tender, non-distended MUSCULOSKELETAL:  No edema; No deformity  SKIN: Warm and dry NEUROLOGIC:  Alert and oriented x 3   Labs    Chemistry Recent Labs  Lab 02/11/18 0500 02/12/18 0359  02/15/18 0357 02/16/18 0746 02/17/18 0412  NA 139 134*   < > 138 139 144  K 5.0 4.4   < > 4.1 3.5 3.0*  CL 112* 109   < > 112* 103 101  CO2 15* 18*   < > 12* 23 29  GLUCOSE 128* 129*   < > 178* 127* 116*  BUN 57* 61*   < > 79* 72* 61*  CREATININE 1.81* 1.92*   < > 2.50* 2.14* 1.78*  CALCIUM 8.9 9.6   < > 9.3 9.1 9.6  PROT 5.8* 5.4*  --   --   --   --   ALBUMIN 3.3* 3.2*  --   --   --   --   AST 65* 41  --   --   --   --   ALT 99* 86*  --   --   --   --   ALKPHOS 82  81  --   --   --   --   BILITOT 1.0 0.9  --   --   --   --   GFRNONAA 25* 23*   < > 17* 20* 25*  GFRAA 28* 26*   < > 19* 23* 29*  ANIONGAP 12 7   < > 14 13 14    < > = values in this interval not displayed.     Hematology Recent Labs  Lab 02/15/18 0357 02/16/18 0746 02/17/18 0412  WBC 14.5* 13.1* 14.1*  RBC 4.11 4.44 4.63  HGB 13.1 14.0 14.7  HCT 40.8 42.9 44.3  MCV 99.3 96.6 95.7  MCH 31.9 31.5 31.7  MCHC 32.1 32.6 33.2  RDW 16.5* 16.4* 16.3*  PLT 216 180 186    Cardiac Enzymes Recent Labs  Lab 02/10/18 1147  TROPONINI 0.56*   No results for input(s): TROPIPOC in the last 168 hours.   BNPNo results for input(s): BNP, PROBNP in the last 168 hours.   DDimer No results for input(s): DDIMER in the last 168 hours.   Radiology    No results found.  Cardiac Studies     Patient Profile     85 y.o. female   [place]     1.  Acute on chronic combined systolic and diastolic congestive heart failure: Patient has an ejection fraction of 15% by echo.  I have  personally reviewed the echocardiogram.   She has been diuresed.  Her net ins and outs are greater than 3 L so far. Continue current medications for now.  2.  Atrial flutter fibrillation: She has converted back to normal sinus rhythm.  3.  Acute on chronic renal insufficiency:    continues to slowly improve.  5.  Hypokalemia:    Add aldactone 12.5 mg a day .  Continue kdur at current dose  Continue to check daily BMP   For questions or updates, please contact [place] Please consult [email or web address] for contact info under        Signed, Mertie Moores, MD  02/17/2018, 10:03 AM    Addendum  I discussed the anticoagulation issue with our pharmacist Hildred Laser.  Long-term anticoagulation would be somewhat problematic for her.  She is a relatively poor anticoagulation candidate to start with given her frailty and risk of falling.  She has extremely limited mobility and having her on Coumadin would be quite tricky because of the need to come to the office frequently for INR measurements.  While her renal function is improving, her creatinine clearance is still quite low and dosing Eliquis or Xarelto would   also be quite problematic.  She has currently converted back to sinus rhythm and it is likely that this episode of atrial fibrillation was related to her acute illness and her acute heart failure.  I think at this point it would be reasonable to stop her IV heparin and not restart oral anticoagulation .  I discussed the case with Dr. Ree Kida who agrees to stop her angicoagulation     Mertie Moores, MD  02/17/2018 1:11 PM    [address],  [place], [place]  [address] Pager [phone] Phone: [phone]; Fax: [phone]

## 2018-02-17 NOTE — Progress Notes (Signed)
Pt with PAF back in at fast rate discussed with Dr. Acie Fredrickson and will add amiodarone drip.  Will not resume IV heparin.

## 2018-02-18 ENCOUNTER — Inpatient Hospital Stay (HOSPITAL_COMMUNITY): Payer: Medicare Other

## 2018-02-18 LAB — BASIC METABOLIC PANEL
Anion gap: 12 (ref 5–15)
BUN: 55 mg/dL — ABNORMAL HIGH (ref 8–23)
CO2: 31 mmol/L (ref 22–32)
Calcium: 10 mg/dL (ref 8.9–10.3)
Chloride: 99 mmol/L (ref 98–111)
Creatinine, Ser: 1.61 mg/dL — ABNORMAL HIGH (ref 0.44–1.00)
GFR calc non Af Amer: 28 mL/min — ABNORMAL LOW (ref >60)
GFR, EST AFRICAN AMERICAN: 33 mL/min — AB (ref >60)
Glucose, Bld: 154 mg/dL — ABNORMAL HIGH (ref 70–99)
POTASSIUM: 4.3 mmol/L (ref 3.5–5.1)
Sodium: 142 mmol/L (ref 135–145)

## 2018-02-18 LAB — CBC
HCT: 48.3 % — ABNORMAL HIGH (ref 36.0–46.0)
Hemoglobin: 15.7 g/dL — ABNORMAL HIGH (ref 12.0–15.0)
MCH: 31.7 pg (ref 26.0–34.0)
MCHC: 32.5 g/dL (ref 30.0–36.0)
MCV: 97.6 fL (ref 80.0–100.0)
Platelets: 202 10*3/uL (ref 150–400)
RBC: 4.95 MIL/uL (ref 3.87–5.11)
RDW: 16.6 % — ABNORMAL HIGH (ref 11.5–15.5)
WBC: 13.2 10*3/uL — AB (ref 4.0–10.5)
nRBC: 0.2 % (ref 0.0–0.2)

## 2018-02-18 LAB — MAGNESIUM: Magnesium: 2.4 mg/dL (ref 1.7–2.4)

## 2018-02-18 MED ORDER — FUROSEMIDE 10 MG/ML IJ SOLN
80.0000 mg | Freq: Two times a day (BID) | INTRAMUSCULAR | Status: DC
  Administered 2018-02-18 – 2018-02-19 (×3): 80 mg via INTRAVENOUS
  Filled 2018-02-18 (×3): qty 8

## 2018-02-18 MED ORDER — POTASSIUM CHLORIDE CRYS ER 20 MEQ PO TBCR
20.0000 meq | EXTENDED_RELEASE_TABLET | Freq: Two times a day (BID) | ORAL | Status: DC
  Administered 2018-02-18: 20 meq via ORAL
  Filled 2018-02-18: qty 1

## 2018-02-18 MED ORDER — ALUM & MAG HYDROXIDE-SIMETH 200-200-20 MG/5ML PO SUSP
30.0000 mL | ORAL | Status: DC | PRN
  Administered 2018-02-18 – 2018-02-20 (×2): 30 mL via ORAL
  Filled 2018-02-18: qty 30

## 2018-02-18 NOTE — Evaluation (Addendum)
Physical Therapy Evaluation Patient Details Name: Kendra Bray MRN: [id number] DOB: [birth year] Today's Date: 02/18/2018   History of Present Illness  Pt is an 85 y.o. female admitted 02/09/18 with confusion. Head CT and MRI negative for acute abnormality. Worked up for CHF, afib with RVR. PMH includes CKD, HTN, stroke, breast CA, obesity.    Clinical Impression  Pt presents with an overall decrease in functional mobility secondary to above. PTA, pt mod indep with SPC and lives alone; walks dog, drives during daylight and involved at church. Today, pt required minA for transfers and gait with RW; easily fatigued by minimal activity. Limited by generalized weakness, decreased activity tolerance and fatigue; at high risk for falls. Pt would benefit from continued acute PT services to maximize functional mobility and independence prior to d/c with SNF-level therapies.   HR 102-115 SpO2 >90% on RA  BPs Supine 100/76  Sitting (pt symptomatic) 95/85  Post-transfer 110/79      Follow Up Recommendations SNF;Supervision for mobility/OOB    Equipment Recommendations  (TBD)    Recommendations for Other Services       Precautions / Restrictions Precautions Precautions: Fall Restrictions Weight Bearing Restrictions: No      Mobility  Bed Mobility Overal bed mobility: Needs Assistance Bed Mobility: Supine to Sit     Supine to sit: Supervision;HOB elevated     General bed mobility comments: Significant increased time and effort. C/o dizziness upon sitting (BP 95/85)  Transfers Overall transfer level: Needs assistance Equipment used: Rolling walker (2 wheeled) Transfers: Sit to/from Stand Sit to Stand: Min assist         General transfer comment: MinA for trunk elevation and balance standing from EOB; pt with heavy reliance on UE support to push into standing. MinA to prevent posterior LOB upon standing  Ambulation/Gait Ambulation/Gait assistance: Min assist Gait  Distance (Feet): 2 Feet Assistive device: Rolling walker (2 wheeled) Gait Pattern/deviations: Step-to pattern;Trunk flexed;Leaning posteriorly Gait velocity: Decreased Gait velocity interpretation: <1.31 ft/sec, indicative of household ambulator General Gait Details: Slow, labored gait taking steps from bed to recliner with RW and minA for balance; pt significantly fatigued requiring seated rest break (BP 110/79)  Stairs            Wheelchair Mobility    Modified Rankin (Stroke Patients Only)       Balance Overall balance assessment: Needs assistance   Sitting balance-Leahy Scale: Fair       Standing balance-Leahy Scale: Poor                               Pertinent Vitals/Pain Pain Assessment: Faces Faces Pain Scale: Hurts a little bit Pain Location: Stomach Pain Descriptors / Indicators: Discomfort Pain Intervention(s): Monitored during session;Limited activity within patient's tolerance    Home Living Family/patient expects to be discharged to:: Skilled nursing facility Living Arrangements: Alone Available Help at Discharge: Family;Friend(s);Available PRN/intermittently Type of Home: House Home Access: Level entry     Home Layout: One level Home Equipment: Walker - 2 wheels;Kasandra Knudsen - single point Additional Comments: Daughter and son-in-law live in [place].    Prior Function Level of Independence: Independent with assistive device(s)         Comments: Uses SPC when walking her dog. Drives; but not at night due to poor vision. Involved at church     Hand Dominance        Extremity/Trunk Assessment   Upper Extremity Assessment Upper Extremity  Assessment: Generalized weakness    Lower Extremity Assessment Lower Extremity Assessment: Generalized weakness    Cervical / Trunk Assessment Cervical / Trunk Assessment: Kyphotic  Communication   Communication: Expressive difficulties;HOH(soft, mumbling speech)  Cognition  Arousal/Alertness: Awake/alert Behavior During Therapy: Flat affect Overall Cognitive Status: Impaired/Different from baseline Area of Impairment: Orientation;Attention;Following commands;Safety/judgement;Awareness;Problem solving                 Orientation Level: Disoriented to;Time     Following Commands: Follows one step commands with increased time Safety/Judgement: Decreased awareness of deficits Awareness: Emergent Problem Solving: Slow processing;Decreased initiation;Requires verbal cues General Comments: Able to state 2020, but not month or date. Soft, mumbling speech difficult to understand at time, but appropriate responses in conversation. Generally moving and responding slow      General Comments General comments (skin integrity, edema, etc.): HR 102-114, SpO2 93-95% on RA    Exercises     Assessment/Plan    PT Assessment Patient needs continued PT services  PT Problem List Decreased strength;Decreased activity tolerance;Decreased balance;Decreased mobility;Decreased cognition;Decreased knowledge of use of DME;Cardiopulmonary status limiting activity       PT Treatment Interventions DME instruction;Gait training;Stair training;Functional mobility training;Therapeutic activities;Therapeutic exercise;Balance training;Patient/family education    PT Goals (Current goals can be found in the Care Plan section)  Acute Rehab PT Goals Patient Stated Goal: Hopeful to feel better and be able to return home to dog PT Goal Formulation: With patient Time For Goal Achievement: 03/04/18 Potential to Achieve Goals: Fair    Frequency Min 2X/week   Barriers to discharge Decreased caregiver support      Co-evaluation               AM-PAC PT "6 Clicks" Mobility  Outcome Measure Help needed turning from your back to your side while in a flat bed without using bedrails?: A Little Help needed moving from lying on your back to sitting on the side of a flat bed without  using bedrails?: A Little Help needed moving to and from a bed to a chair (including a wheelchair)?: A Little Help needed standing up from a chair using your arms (e.g., wheelchair or bedside chair)?: A Little Help needed to walk in hospital room?: A Little Help needed climbing 3-5 steps with a railing? : A Lot 6 Click Score: 17    End of Session Equipment Utilized During Treatment: Gait belt Activity Tolerance: Patient tolerated treatment well;Patient limited by fatigue Patient left: in chair;with call bell/phone within reach;with chair alarm set Nurse Communication: Mobility status PT Visit Diagnosis: Other abnormalities of gait and mobility (R26.89);Muscle weakness (generalized) (M62.81)    Time: 0931-1216 PT Time Calculation (min) (ACUTE ONLY): 24 min   Charges:   PT Evaluation $PT Eval Moderate Complexity: 1 Mod PT Treatments $Gait Training: 8-22 mins      Mabeline Caras, PT, DPT Acute Rehabilitation Services  Pager [phone] Office [place] 02/18/2018, 11:02 AM

## 2018-02-18 NOTE — Progress Notes (Signed)
Patient had another vomiting episode at dinner.  This occurred after IV amio was stopped.  This made 3 vomiting episodes with meals (breakfast, lunch, and dinner) today.

## 2018-02-18 NOTE — Evaluation (Signed)
Occupational Therapy Evaluation Patient Details Name: Kendra Bray MRN: [id number] DOB: [birth year] Today's Date: 02/18/2018    History of Present Illness Pt is an 85 y.o. female admitted 02/09/18 with confusion. Head CT and MRI negative for acute abnormality. Worked up for CHF, afib with RVR. PMH includes CKD, HTN, stroke, breast CA, obesity.   Clinical Impression   Pt is an 85 yo female s/p above dx. Pt PTA: pt living alone with family in [place]. Independent with ADL and mobility. Pt currently, requiring increased assist for mobility, transfers and ADL tasks. Pt requires minimal cueing for proper hand technique and to attend to task. Pt has flat affect at this time. Pt educated on building up tolerance to sitting in chair for 1-2 hours before returning to bed. Pt fatigued with moderate exertion today. HR <124 BPM w/ activity. SpO2 >93% on RA. BP in sitting 98/76 and BP after activity 98/75- unable to read BP in standing. Pt would benefit from continued OT skilled services for ADL, mobility and safety in SNF setting.    Follow Up Recommendations  SNF;Supervision/Assistance - 24 hour    Equipment Recommendations  3 in 1 bedside commode    Recommendations for Other Services       Precautions / Restrictions Precautions Precautions: Fall Restrictions Weight Bearing Restrictions: No      Mobility Bed Mobility Overal bed mobility: Needs Assistance Bed Mobility: Supine to Sit     Supine to sit: Supervision;HOB elevated     General bed mobility comments: Received sitting in recliner covered in vomit  Transfers Overall transfer level: Needs assistance Equipment used: Rolling walker (2 wheeled) Transfers: Sit to/from Stand Sit to Stand: Min assist         General transfer comment: minA for transfers with cues for hand placement.    Balance Overall balance assessment: Needs assistance   Sitting balance-Leahy Scale: Fair       Standing balance-Leahy Scale: Poor                              ADL either performed or assessed with clinical judgement   ADL Overall ADL's : Needs assistance/impaired Eating/Feeding: Modified independent   Grooming: Wash/dry hands;Wash/dry face;Applying deodorant;Oral care   Upper Body Bathing: Minimal assistance;Sitting   Lower Body Bathing: Moderate assistance   Upper Body Dressing : Minimal assistance   Lower Body Dressing: Moderate assistance   Toilet Transfer: Minimal assistance   Toileting- Clothing Manipulation and Hygiene: Moderate assistance       Functional mobility during ADLs: Minimal assistance;Rolling walker;Cueing for safety;Cueing for sequencing General ADL Comments: Pt requires increased time for tasks; pt following commands with multiple verbal cues     Vision Baseline Vision/History: No visual deficits Vision Assessment?: No apparent visual deficits     Perception     Praxis      Pertinent Vitals/Pain Pain Assessment: Faces Faces Pain Scale: Hurts a little bit Pain Location: Stomach Pain Descriptors / Indicators: Discomfort Pain Intervention(s): Limited activity within patient's tolerance     Hand Dominance Right   Extremity/Trunk Assessment Upper Extremity Assessment Upper Extremity Assessment: Generalized weakness   Lower Extremity Assessment Lower Extremity Assessment: Generalized weakness   Cervical / Trunk Assessment Cervical / Trunk Assessment: Kyphotic   Communication Communication Communication: Expressive difficulties;No difficulties   Cognition Arousal/Alertness: Awake/alert Behavior During Therapy: Flat affect Overall Cognitive Status: Impaired/Different from baseline Area of Impairment: Attention;Memory;Following commands;Safety/judgement;Awareness;Problem solving  Orientation Level: Disoriented to;Time Current Attention Level: Sustained   Following Commands: Follows one step commands with increased time Safety/Judgement:  Decreased awareness of deficits Awareness: Emergent Problem Solving: Slow processing;Decreased initiation;Requires verbal cues     General Comments  Daughter present at end of session; increased time spent discussing pt's current condition and SNF recommendation, pt and family on board    Exercises     Shoulder Instructions      Home Living Family/patient expects to be discharged to:: Skilled nursing facility Living Arrangements: Alone Available Help at Discharge: Family;Friend(s);Available PRN/intermittently Type of Home: House Home Access: Level entry     Home Layout: One level     Bathroom Shower/Tub: Occupational psychologist: Standard     Home Equipment: Environmental consultant - 2 wheels;Cane - single point;Bedside commode   Additional Comments: Daughter and son-in-law live in [place].      Prior Functioning/Environment Level of Independence: Independent with assistive device(s)        Comments: Uses SPC when walking her dog. Drives; but not at night due to poor vision. Involved at church        OT Problem List: Decreased strength;Decreased activity tolerance;Impaired balance (sitting and/or standing);Decreased safety awareness;Pain      OT Treatment/Interventions: Self-care/ADL training;Therapeutic exercise;Energy conservation;Therapeutic activities;Visual/perceptual remediation/compensation;Patient/family education;Balance training    OT Goals(Current goals can be found in the care plan section) Acute Rehab OT Goals Patient Stated Goal: Agreeable to rehab at SNF OT Goal Formulation: With patient Time For Goal Achievement: 03/04/18 Potential to Achieve Goals: Fair ADL Goals Pt Will Perform Lower Body Dressing: with supervision;sit to/from stand Pt Will Transfer to Toilet: with min guard assist;ambulating Pt Will Perform Toileting - Clothing Manipulation and hygiene: with min guard assist;sit to/from stand Additional ADL Goal #1: Pt will perform transfers and ADL  functional mobility with minguardA  OT Frequency: Min 2X/week   Barriers to D/C:            Co-evaluation              AM-PAC OT "6 Clicks" Daily Activity     Outcome Measure Help from another person eating meals?: None Help from another person taking care of personal grooming?: A Little Help from another person toileting, which includes using toliet, bedpan, or urinal?: A Little Help from another person bathing (including washing, rinsing, drying)?: A Lot Help from another person to put on and taking off regular upper body clothing?: A Little Help from another person to put on and taking off regular lower body clothing?: A Lot 6 Click Score: 17   End of Session Equipment Utilized During Treatment: Gait belt;Rolling walker Nurse Communication: Mobility status  Activity Tolerance: Treatment limited secondary to medical complications (Comment);Patient limited by fatigue Patient left: in chair;with call bell/phone within reach;with family/visitor present  OT Visit Diagnosis: Unsteadiness on feet (R26.81);Muscle weakness (generalized) (M62.81)                Time: 1449-1530 OT Time Calculation (min): 41 min Charges:  OT General Charges $OT Visit: 1 Visit OT Evaluation $OT Eval Moderate Complexity: 1 Mod OT Treatments $Self Care/Home Management : 8-22 mins  Ebony Hail Harold Hedge) Marsa Aris OTR/L Acute Rehabilitation Services Pager: [phone] Office: [phone]   Fredda Hammed 02/18/2018, 4:11 PM

## 2018-02-18 NOTE — Progress Notes (Signed)
Physical Therapy Treatment Patient Details Name: Kendra Bray MRN: [id number] DOB: 13-Mar-1927 Today's Date: 02/18/2018    History of Present Illness Pt is an 85 y.o. female admitted 02/09/18 with confusion. Head CT and MRI negative for acute abnormality. Worked up for CHF, afib with RVR. PMH includes CKD, HTN, stroke, breast CA, obesity.   PT Comments    Pt seen for additional session after returning to room with her covered in vomit and needing to urinate. Pt requiring minA and BUE support to transfer from recliner<>BSC; minA with upper body bathing/dressing, maxA for lower body bathing/dressing. Pt limited by fatigue and nausea. Daughter present at end of session; increased time discussing pt's current condition and recommendation for SNF-level therapies. Family in agreement with SNF rec. Will continue to follow acutely.   Follow Up Recommendations  SNF;Supervision for mobility/OOB     Equipment Recommendations  (TBD)    Recommendations for Other Services       Precautions / Restrictions Precautions Precautions: Fall Restrictions Weight Bearing Restrictions: No    Mobility  Bed Mobility Overal bed mobility: Needs Assistance Bed Mobility: Supine to Sit     Supine to sit: Supervision;HOB elevated     General bed mobility comments: Received sitting in recliner covered in vomit  Transfers Overall transfer level: Needs assistance Equipment used: 1 person hand held assist Transfers: Sit to/from Stand Sit to Stand: Min assist         General transfer comment: MinA to stand from recliner and again from [facility] with HHA, pt maintaining forward flexed posture despite cues for hip/back extension; reaching to furniture for support of other UE  Ambulation/Gait Ambulation/Gait assistance: Min assist Gait Distance (Feet): 2 Feet Assistive device: 1 person hand held assist Gait Pattern/deviations: Step-to pattern;Trunk flexed;Leaning posteriorly Gait velocity: Decreased Gait  velocity interpretation: <1.31 ft/sec, indicative of household ambulator General Gait Details: Pivotal steps from recliner to [facility] and back with HHA and minA for balance, pt also reaching to furniture for support; very fatigued   Stairs             Wheelchair Mobility    Modified Rankin (Stroke Patients Only)       Balance Overall balance assessment: Needs assistance   Sitting balance-Leahy Scale: Fair       Standing balance-Leahy Scale: Poor                              Cognition Arousal/Alertness: Awake/alert Behavior During Therapy: Flat affect Overall Cognitive Status: Impaired/Different from baseline Area of Impairment: Attention;Following commands;Safety/judgement;Awareness;Problem solving                 Orientation Level: Disoriented to;Time Current Attention Level: Sustained   Following Commands: Follows one step commands with increased time Safety/Judgement: Decreased awareness of deficits Awareness: Emergent Problem Solving: Slow processing;Decreased initiation;Requires verbal cues General Comments: Able to state 2020, but not month or date. Soft, mumbling speech difficult to understand at time, but appropriate responses in conversation. Generally moving and responding slow      Exercises      General Comments General comments (skin integrity, edema, etc.): Daughter present at end of session; increased time spent discussing pt's current condition and SNF recommendation, pt and family on board      Pertinent Vitals/Pain Pain Assessment: Faces Faces Pain Scale: Hurts a little bit Pain Location: Stomach Pain Descriptors / Indicators: Discomfort Pain Intervention(s): Limited activity within patient's tolerance;Monitored during session  Home Living Family/patient expects to be discharged to:: Skilled nursing facility Living Arrangements: Alone Available Help at Discharge: Family;Friend(s);Available PRN/intermittently Type of  Home: House Home Access: Level entry   Home Layout: One level Home Equipment: Walker - 2 wheels;Kasandra Knudsen - single point Additional Comments: Daughter and son-in-law live in [place].    Prior Function Level of Independence: Independent with assistive device(s)      Comments: Uses SPC when walking her dog. Drives; but not at night due to poor vision. Involved at church   PT Goals (current goals can now be found in the care plan section) Acute Rehab PT Goals Patient Stated Goal: Agreeable to rehab at SNF PT Goal Formulation: With patient/family Time For Goal Achievement: 03/04/18 Potential to Achieve Goals: Good Progress towards PT goals: Progressing toward goals    Frequency    Min 2X/week      PT Plan Current plan remains appropriate    Co-evaluation              AM-PAC PT "6 Clicks" Mobility   Outcome Measure  Help needed turning from your back to your side while in a flat bed without using bedrails?: A Little Help needed moving from lying on your back to sitting on the side of a flat bed without using bedrails?: A Little Help needed moving to and from a bed to a chair (including a wheelchair)?: A Little Help needed standing up from a chair using your arms (e.g., wheelchair or bedside chair)?: A Little Help needed to walk in hospital room?: A Little Help needed climbing 3-5 steps with a railing? : A Lot 6 Click Score: 17    End of Session Equipment Utilized During Treatment: Gait belt Activity Tolerance: Patient limited by fatigue Patient left: in chair;with call bell/phone within reach;with chair alarm set;with family/visitor present Nurse Communication: Mobility status PT Visit Diagnosis: Other abnormalities of gait and mobility (R26.89);Muscle weakness (generalized) (M62.81)     Time: 6122-4497 PT Time Calculation (min) (ACUTE ONLY): 16 min  Charges:  $Self Care/Home Management: Anton Ruiz, PT, DPT Acute Rehabilitation Services   Pager [phone] Office [phone]  Kendra Bray 02/18/2018, 1:05 PM

## 2018-02-18 NOTE — Progress Notes (Signed)
PROGRESS NOTE    Kendra Bray [facility]  JQB:[id number] DOB: [birth year] DOA: 02/09/2018 PCP: Dorena Cookey, MD   Brief Narrative:  HPI on 02/09/2018 by Dr. Gean Birchwood Kendra Bray is a 85 y.o. female with history of chronic kidney disease, breast cancer in remission, gout was brought to the ER after patient was found to be confused.  As per the family who discussed with the ER physician patient has not been able to be reached for the last 4 days and fashion the daughter had arrived from [place] to check on the patient.  Patient was found to be confused and was brought to the ER.  As per the family patient also was having some hallucinations.  Patient did not complain of any headache chest pain or shortness of breath nausea vomiting or diarrhea.  Interim history Patient was admitted with confusion found to have atrial fibrillation with RVR.  She was also found to have a cool right lower extremity and transferred to [facility] for vascular evaluation.  Cardiology was also consulted as patient had systolic heart failure exacerbation. Assessment & Plan   Paroxysmal atrial fibrillation with RVR -Cardiology consulted and appreciated -Patient was started on Cardizem drip initially however this was transitioned to oral metoprolol -Patient noted to have hypotension and bradycardia on oral Cardizem -Discussed at length today with Dr. Acie Fredrickson, cardiology, given her current status, age, limited mobility, patient likely not able to have frequent INR checks in the outpatient setting, renal function will not allow for newer medications. -will hold heparin drip and anticoagulation  -Went back into atrial fib with RVR yesterday evening, currently she appears to be in sinus tachycardia. -Cardiology started amiodarone drip on 05/06/7351  Acute metabolic encephalopathy -Appears to be at baseline today -MRI brain was negative for acute infarct -Vitamin B12 2568 -TSH was slightly elevated however normal T3  and T4 -Chest x-ray and UA unremarkable for infection  Acute systolic heart failure -Echocardiogram showed an EF of 15 to 20% with diffuse hypokinesis. -As above, cardiology consulted and appreciated -Continue to monitor intake and output, daily weights -Was given IV Lasix on 02/14/2018 and made little urine output, she was subsequently placed on bicarb drip.   -Was on Lasix drip however now converted to 80 mg IV twice daily  -Cardiac catheterization plan on hold for now  Mildly elevated troponins -Most likely secondary to demand ischemia  Right lower extremity ischemia -Ruled out -Vascular surgery did not appreciated.  No evidence of occlusive arterial disease on imaging.  No further work-up needed as per vascular surgery  Acute kidney injury on chronic kidney disease, stage III with metabolic acidosis -Baseline creatinine approximately 1.6-2 -Currently on IV Lasix.  Bicarb drip was discontinued -Creatinine currently 1.61 continue to monitor BMP  Hypokalemia -Will replace and continue to monitor BMP -Likely secondary to Lasix -Magnesium 2.4 after supplementation  Leukocytosis -Reactive to the above, continue to monitor CBC  Generalized deconditioning -Overall prognosis is guarded to poor.  Palliative care has been consulted. -Patient currently DNR.  Will continue to treat the treatable at this time.  On discharge, recommend palliative care to follow  Vomiting -Patient complains of vomiting, without nausea or abdominal pain -Question whether this is due to amiodarone -Continue maalox  -Obtain KUB  DVT Prophylaxis  Heparin SQ  Code Status: DNR  Family Communication: None at bedside  Disposition Plan: Admitted. Continues to be on lasix drip. Pending further cardiology recommendations. dispo TBD  Consultants Cardiology Vascular surgery Palliative care  Procedures  Echocardiogram Lower extremity Doppler, ABI  Antibiotics   Anti-infectives (From admission, onward)    None      Subjective:   [facility] seen and examined today.  Patient complains of 2 episodes of vomiting.  Feels it may be she drank too much.  Denies current chest pain, shortness of breath, abdominal pain or nausea, dizziness or headache.  Objective:   Vitals:   02/18/18 0014 02/18/18 0445 02/18/18 0522 02/18/18 0941  BP: 97/79 102/77  100/76  Pulse: (!) 108 (!) 109  (!) 110  Resp: 19 (!) 22    Temp: 97.8 F (36.6 C) (!) 97.5 F (36.4 C)    TempSrc: Axillary Axillary    SpO2: 96% 97%    Weight:   65.8 kg   Height:        Intake/Output Summary (Last 24 hours) at 02/18/2018 1217 Last data filed at 02/18/2018 0600 Gross per 24 hour  Intake 1327.51 ml  Output 1950 ml  Net -622.49 ml   Filed Weights   02/16/18 0505 02/17/18 0534 02/18/18 0522  Weight: 73.9 kg 68.6 kg 65.8 kg   Exam  General: Well developed, chronically ill appearing, NAD  HEENT: NCAT, mucous membranes moist.   Neck: Supple  Cardiovascular: S1 S2 auscultated, no murmur, RRR  Respiratory: Clear to auscultation bilaterally with equal chest rise  Abdomen: Soft, nontender, nondistended, + bowel sounds  Extremities: warm dry without cyanosis clubbing or edema  Neuro: AAOx3, nonfocal  Psych: Appropriate mood and affect   Data Reviewed: I have personally reviewed following labs and imaging studies  CBC: Recent Labs  Lab 02/14/18 0402 02/15/18 0357 02/16/18 0746 02/17/18 0412 02/18/18 0451  WBC 14.6* 14.5* 13.1* 14.1* 13.2*  HGB 13.5 13.1 14.0 14.7 15.7*  HCT 43.2 40.8 42.9 44.3 48.3*  MCV 102.4* 99.3 96.6 95.7 97.6  PLT 274 216 180 186 053   Basic Metabolic Panel: Recent Labs  Lab 02/14/18 0402 02/14/18 1628 02/15/18 0357 02/16/18 0746 02/17/18 0412 02/18/18 0451  NA 138 137 138 139 144 142  K 5.7* 6.0* 4.1 3.5 3.0* 4.3  CL 113* 115* 112* 103 101 99  CO2 13* 8* 12* 23 29 31   GLUCOSE 142* 138* 178* 127* 116* 154*  BUN 63* 76* 79* 72* 61* 55*  CREATININE 2.03* 2.31* 2.50*  2.14* 1.78* 1.61*  CALCIUM 9.6 9.8 9.3 9.1 9.6 10.0  MG 2.4  --  2.2 1.9 1.7 2.4   GFR: Estimated Creatinine Clearance: 20.4 mL/min (A) (by C-G formula based on SCr of 1.61 mg/dL (H)). Liver Function Tests: Recent Labs  Lab 02/12/18 0359  AST 41  ALT 86*  ALKPHOS 81  BILITOT 0.9  PROT 5.4*  ALBUMIN 3.2*   No results for input(s): LIPASE, AMYLASE in the last 168 hours. No results for input(s): AMMONIA in the last 168 hours. Coagulation Profile: Recent Labs  Lab 02/12/18 0359  INR 1.18   Cardiac Enzymes: No results for input(s): CKTOTAL, CKMB, CKMBINDEX, TROPONINI in the last 168 hours. BNP (last 3 results) No results for input(s): PROBNP in the last 8760 hours. HbA1C: No results for input(s): HGBA1C in the last 72 hours. CBG: Recent Labs  Lab 02/16/18 1119  GLUCAP 145*   Lipid Profile: No results for input(s): CHOL, HDL, LDLCALC, TRIG, CHOLHDL, LDLDIRECT in the last 72 hours. Thyroid Function Tests: No results for input(s): TSH, T4TOTAL, FREET4, T3FREE, THYROIDAB in the last 72 hours. Anemia Panel: No results for input(s): VITAMINB12, FOLATE, FERRITIN, TIBC, IRON, RETICCTPCT in the last  72 hours. Urine analysis:    Component Value Date/Time   COLORURINE YELLOW 02/09/2018 2003   APPEARANCEUR CLEAR 02/09/2018 2003   LABSPEC 1.019 02/09/2018 2003   PHURINE 5.0 02/09/2018 2003   GLUCOSEU NEGATIVE 02/09/2018 2003   HGBUR NEGATIVE 02/09/2018 2003   HGBUR negative 02/14/2010 1317   BILIRUBINUR NEGATIVE 02/09/2018 2003   BILIRUBINUR n 02/28/2013 1112   KETONESUR 5 (A) 02/09/2018 2003   PROTEINUR NEGATIVE 02/09/2018 2003   UROBILINOGEN 0.2 02/28/2013 1112   UROBILINOGEN 0.2 02/14/2010 1317   NITRITE NEGATIVE 02/09/2018 2003   LEUKOCYTESUR NEGATIVE 02/09/2018 2003   Sepsis Labs: @LABRCNTIP (procalcitonin:4,lacticidven:4)  ) Recent Results (from the past 240 hour(s))  Blood culture (routine x 2)     Status: None   Collection Time: 02/09/18  4:38 PM  Result  Value Ref Range Status   Specimen Description   Final    BLOOD RIGHT HAND Performed at [facility], [place] [address], [place], [place] [address]    Special Requests   Final    BOTTLES DRAWN AEROBIC AND ANAEROBIC Blood Culture results may not be optimal due to an inadequate volume of blood received in culture bottles Performed at [place] [address], [place], [place] [address]    Culture   Final    NO GROWTH 5 DAYS Performed at [facility], [place] [address], [place], [place] [address]    Report Status 02/15/2018 FINAL  Final  Blood culture (routine x 2)     Status: None   Collection Time: 02/09/18  4:42 PM  Result Value Ref Range Status   Specimen Description   Final    BLOOD RIGHT ANTECUBITAL Performed at [place] [address], [place], [place] [address]    Special Requests   Final    BOTTLES DRAWN AEROBIC AND ANAEROBIC Blood Culture results may not be optimal due to an excessive volume of blood received in culture bottles Performed at [place] [address], [place], [place] [address]    Culture   Final    NO GROWTH 5 DAYS Performed at [facility], [place] [address] [place], [place] [address]    Report Status 02/14/2018 FINAL  Final      Radiology Studies: No results found.   Scheduled Meds: . allopurinol  100 mg Oral Daily  . furosemide  80 mg Intravenous Q12H  . heparin injection (subcutaneous)  5,000 Units Subcutaneous Q8H  . metoprolol tartrate  50 mg Oral TID  . potassium chloride  20 mEq Oral BID  . spironolactone  12.5 mg Oral Daily   Continuous Infusions: . amiodarone 30 mg/hr (02/18/18 0600)     LOS: 9 days   Time Spent in minutes   45 minutes (greater than 50% of time spent with patient face to face, as well as reviewing records and discussing with consultants, and formulating a plan)   Cristal Ford D.O. on 02/18/2018 at 12:17 PM  Between 7am  to 7pm - Please see pager noted on [email or web address]  After 7pm go to [email or web address]  And look for the night coverage person covering for me after hours  [facility] Office  [phone]

## 2018-02-18 NOTE — Progress Notes (Signed)
Progress Note  Patient Name: Kendra Bray Date of Encounter: 02/18/2018  Primary Cardiologist: Sanda Klein, MD    Subjective   85 year old female with a history of chronic kidney disease, hypertension, old stroke, breast cancer, obesity who is seen for further evaluation of atrial fibrillation with a rapid ventricular response and congestive heart failure.  Echocardiogram from January 15 reveals severely depressed left ventricular systolic function with an ejection fraction of 15 to 20%.  She has diffuse hypokinesis.  She has moderate pulmonary hypertension with an estimated PA pressure of 51 mmHg.  She continues to diurese.  She has been seen by palliative care. Her renal function continues to gradually improve.  Her creatinine today is 1.61.   She has diuresed a total of 4.1 L so far during this hospitalization. She is more alert today.  She is getting ready to work with physical therapy.   Inpatient Medications    Scheduled Meds: . allopurinol  100 mg Oral Daily  . heparin injection (subcutaneous)  5,000 Units Subcutaneous Q8H  . metoprolol tartrate  50 mg Oral TID  . potassium chloride  40 mEq Oral BID  . spironolactone  12.5 mg Oral Daily   Continuous Infusions: . amiodarone 30 mg/hr (02/18/18 0600)  . furosemide Stopped (02/17/18 2238)   PRN Meds: acetaminophen **OR** acetaminophen, alum & mag hydroxide-simeth, ondansetron **OR** ondansetron (ZOFRAN) IV   Vital Signs    Vitals:   02/18/18 0014 02/18/18 0445 02/18/18 0522 02/18/18 0941  BP: 97/79 102/77  100/76  Pulse: (!) 108 (!) 109  (!) 110  Resp: 19 (!) 22    Temp: 97.8 F (36.6 C) (!) 97.5 F (36.4 C)    TempSrc: Axillary Axillary    SpO2: 96% 97%    Weight:   65.8 kg   Height:        Intake/Output Summary (Last 24 hours) at 02/18/2018 0947 Last data filed at 02/18/2018 0600 Gross per 24 hour  Intake 1327.51 ml  Output 1950 ml  Net -622.49 ml   Last 3 Weights 02/18/2018 02/17/2018 02/16/2018    Weight (lbs) 145 lb 1 oz 151 lb 3.8 oz 162 lb 14.7 oz  Weight (kg) 65.8 kg 68.6 kg 73.9 kg      Telemetry    Sinus tachycardia- Personally Reviewed  ECG      Physical Exam   Physical Exam: Blood pressure 100/76, pulse (!) 110, temperature (!) 97.5 F (36.4 C), temperature source Axillary, resp. rate (!) 22, height 5' (1.524 m), weight 65.8 kg, SpO2 97 %.  GEN: Chronically ill-appearing female, fairly weak.  Lying in bed. HEENT: Normal NECK: No JVD; No carotid bruits LYMPHATICS: No lymphadenopathy CARDIAC: Regular rate S1-S2.  Mildly tachycardic RESPIRATORY:  Clear to auscultation without rales, wheezing or rhonchi  ABDOMEN: Soft, non-tender, non-distended MUSCULOSKELETAL:  No edema; No deformity  SKIN: Warm and dry NEUROLOGIC:  Alert and oriented x 3   Labs    Chemistry Recent Labs  Lab 02/12/18 0359  02/16/18 0746 02/17/18 0412 02/18/18 0451  NA 134*   < > 139 144 142  K 4.4   < > 3.5 3.0* 4.3  CL 109   < > 103 101 99  CO2 18*   < > 23 29 31   GLUCOSE 129*   < > 127* 116* 154*  BUN 61*   < > 72* 61* 55*  CREATININE 1.92*   < > 2.14* 1.78* 1.61*  CALCIUM 9.6   < > 9.1 9.6 10.0  PROT 5.4*  --   --   --   --   ALBUMIN 3.2*  --   --   --   --   AST 41  --   --   --   --   ALT 86*  --   --   --   --   ALKPHOS 81  --   --   --   --   BILITOT 0.9  --   --   --   --   GFRNONAA 23*   < > 20* 25* 28*  GFRAA 26*   < > 23* 29* 33*  ANIONGAP 7   < > 13 14 12    < > = values in this interval not displayed.     Hematology Recent Labs  Lab 02/16/18 0746 02/17/18 0412 02/18/18 0451  WBC 13.1* 14.1* 13.2*  RBC 4.44 4.63 4.95  HGB 14.0 14.7 15.7*  HCT 42.9 44.3 48.3*  MCV 96.6 95.7 97.6  MCH 31.5 31.7 31.7  MCHC 32.6 33.2 32.5  RDW 16.4* 16.3* 16.6*  PLT 180 186 202    Cardiac Enzymes No results for input(s): TROPONINI in the last 168 hours. No results for input(s): TROPIPOC in the last 168 hours.   BNPNo results for input(s): BNP, PROBNP in the last 168  hours.   DDimer No results for input(s): DDIMER in the last 168 hours.   Radiology    No results found.  Cardiac Studies     Patient Profile     85 y.o. female   [place]     1.  Acute on chronic combined systolic and diastolic congestive heart failure: Patient has an ejection fraction of 15% by echo.  I have personally reviewed the echocardiogram.    She continues to diurese fairly well.  She seems to be feeling quite a bit better.  She still has markedly reduced functional status as far as I can tell.  She is working with physical therapy.  2.  Atrial flutter fibrillation: Transient episode of atrial fibrillation yesterday but has gone back into normal sinus rhythm. He is a very poor candidate for chronic anticoagulation so we have discontinued heparin.  We do not have plans to start Eliquis.  3.  Acute on chronic renal insufficiency:    Renal function continues to improve.  Will reduce her lasix to 80 mg IV bid   5.  Hypokalemia:    Add aldactone 12.5 mg a day .  Continue kdur at current dose  Continue to check daily BMP  Potassium is now normal Will decrease the lasix to 80 mg IV BID and reduce the Kdur to 20 meq BID  Bmp tmorrow   For questions or updates, please contact [place] Please consult [email or web address] for contact info under        Signed, Mertie Moores, MD  02/18/2018, 9:47 AM    Addendum  I discussed the anticoagulation issue with our pharmacist Hildred Laser.  Long-term anticoagulation would be somewhat problematic for her.  She is a relatively poor anticoagulation candidate to start with given her frailty and risk of falling.  She has extremely limited mobility and having her on Coumadin would be quite tricky because of the need to come to the office frequently for INR measurements.  While her renal function is improving, her creatinine clearance is still quite low and dosing Eliquis or Xarelto would   also be quite problematic.  She  has currently converted back  to sinus rhythm and it is likely that this episode of atrial fibrillation was related to her acute illness and her acute heart failure.  I think at this point it would be reasonable to stop her IV heparin and not restart oral anticoagulation .  I discussed the case with Dr. Ree Kida who agrees to stop her angicoagulation     Mertie Moores, MD  02/18/2018 9:47 AM    [place],  [place], [place]  [address] Pager [phone] Phone: [phone]; Fax: [phone]

## 2018-02-19 LAB — HEMOGLOBIN AND HEMATOCRIT, BLOOD
HCT: 50.7 % — ABNORMAL HIGH (ref 36.0–46.0)
Hemoglobin: 16.4 g/dL — ABNORMAL HIGH (ref 12.0–15.0)

## 2018-02-19 LAB — BASIC METABOLIC PANEL
Anion gap: 15 (ref 5–15)
Anion gap: 15 (ref 5–15)
BUN: 63 mg/dL — ABNORMAL HIGH (ref 8–23)
BUN: 67 mg/dL — ABNORMAL HIGH (ref 8–23)
CO2: 30 mmol/L (ref 22–32)
CO2: 32 mmol/L (ref 22–32)
CREATININE: 1.99 mg/dL — AB (ref 0.44–1.00)
Calcium: 10.7 mg/dL — ABNORMAL HIGH (ref 8.9–10.3)
Calcium: 10.6 mg/dL — ABNORMAL HIGH (ref 8.9–10.3)
Chloride: 98 mmol/L (ref 98–111)
Chloride: 92 mmol/L — ABNORMAL LOW (ref 98–111)
Creatinine, Ser: 1.92 mg/dL — ABNORMAL HIGH (ref 0.44–1.00)
GFR calc Af Amer: 26 mL/min — ABNORMAL LOW (ref >60)
GFR calc non Af Amer: 23 mL/min — ABNORMAL LOW (ref >60)
GFR calc non Af Amer: 22 mL/min — ABNORMAL LOW (ref >60)
GFR, EST AFRICAN AMERICAN: 25 mL/min — AB (ref >60)
Glucose, Bld: 108 mg/dL — ABNORMAL HIGH (ref 70–99)
Glucose, Bld: 175 mg/dL — ABNORMAL HIGH (ref 70–99)
POTASSIUM: 5.8 mmol/L — AB (ref 3.5–5.1)
Potassium: 4.9 mmol/L (ref 3.5–5.1)
Sodium: 143 mmol/L (ref 135–145)
Sodium: 139 mmol/L (ref 135–145)

## 2018-02-19 MED ORDER — METOPROLOL TARTRATE 50 MG PO TABS
50.0000 mg | ORAL_TABLET | Freq: Once | ORAL | Status: AC
  Administered 2018-02-19: 50 mg via ORAL
  Filled 2018-02-19: qty 1

## 2018-02-19 MED ORDER — METOPROLOL TARTRATE 100 MG PO TABS
100.0000 mg | ORAL_TABLET | Freq: Two times a day (BID) | ORAL | Status: DC
  Administered 2018-02-19 – 2018-02-24 (×10): 100 mg via ORAL
  Filled 2018-02-19 (×10): qty 1

## 2018-02-19 NOTE — Care Management Important Message (Signed)
Important Message  Patient Details  Name: Kendra Bray MRN: [id number] Date of Birth: 15-Jun-1927   Medicare Important Message Given:  Yes    Johnita Palleschi P Kariss Longmire 02/19/2018, 11:32 AM

## 2018-02-19 NOTE — Care Management Note (Addendum)
Case Management Note  Patient Details  Name: Kendra Bray MRN: [id number] Date of Birth: [birth year]  Subjective/Objective:  Pt presented for Atrial Fib RVR-now on po medications. PT recommendations for SNF. CSW following for transition of care needs.                 Action/Plan: CM will continue to follow for additional transition of care needs.   Expected Discharge Date:             Expected Discharge Plan:  Skilled Nursing Facility  In-House Referral:  Clinical Social Work  Discharge planning Services  CM Consult  Post Acute Care Choice:  NA Choice offered to:  NA  DME Arranged:  N/A DME Agency:  NA  HH Arranged:  NA HH Agency:  NA  Status of Service:  Completed, signed off  If discussed at [employer] of Stay Meetings, dates discussed:    Additional Comments: [place] 02-24-18 Jacqlyn Krauss, RN,BSN [phone] Patient has Hospice Residential Bed available at [facility]. No further needs from CM at this time.  Bethena Roys, RN 02/19/2018, 12:46 PM

## 2018-02-19 NOTE — Progress Notes (Addendum)
Daily Progress Note   Patient Name: Kendra Bray       Date: 02/19/2018 DOB: February 15, 1927  Age: 85 y.o. MRN#: [id number] Attending Physician: Cristal Ford, DO Primary Care Physician: Dorena Cookey, MD Admit Date: 02/09/2018  Reason for Consultation/Follow-up: Establishing goals of care  Subjective: Patient is resting in bed. Daughter is at bedside. She states her mother was doing well prior to 2 weeks ago. She agrees with her mother that as long as she is comfortable and has quality of life, they would like to treat the treatable. She states at the point she is no longer comfortable, they will consider focusing only on comfort. She states she knows it is only a matter of time for her mother. Will continue to monitor.   Length of Stay: 10  Current Medications: Scheduled Meds:  . allopurinol  100 mg Oral Daily  . furosemide  80 mg Intravenous Q12H  . heparin injection (subcutaneous)  5,000 Units Subcutaneous Q8H  . metoprolol tartrate  100 mg Oral BID  . spironolactone  12.5 mg Oral Daily    Continuous Infusions:   PRN Meds: acetaminophen **OR** acetaminophen, alum & mag hydroxide-simeth, ondansetron **OR** ondansetron (ZOFRAN) IV  Physical Exam Constitutional:      Comments: Resting with eyes closed.   Pulmonary:     Effort: Pulmonary effort is normal.             Vital Signs: BP 105/73   Pulse (!) 118   Temp 98 F (36.7 C) (Oral)   Resp 13   Ht 5' (1.524 m)   Wt 65.1 kg   SpO2 98%   BMI 28.03 kg/m  SpO2: SpO2: 98 % O2 Device: O2 Device: Nasal Cannula O2 Flow Rate: O2 Flow Rate (L/min): 2 L/min  Intake/output summary:   Intake/Output Summary (Last 24 hours) at 02/19/2018 1451 Last data filed at 02/19/2018 1306 Gross per 24 hour  Intake 180 ml  Output 1675  ml  Net -1495 ml   LBM: Last BM Date: 02/17/18(per daughter at bedside) Baseline Weight: Weight: 73.6 kg Most recent weight: Weight: 65.1 kg       Palliative Assessment/Data:    Flowsheet Rows     Most Recent Value  Intake Tab  Referral Department  Hospitalist  Unit at Time of Referral  Cardiac/Telemetry Unit  Palliative Care Primary Diagnosis  Cardiac  Date Notified  02/15/18  Palliative Care Type  New Palliative care  Reason for referral  Clarify Goals of Care  Date of Admission  02/09/18  Date first seen by Palliative Care  02/16/18  # of days Palliative referral response time  1 Day(s)  # of days IP prior to Palliative referral  6  Clinical Assessment  Psychosocial & Spiritual Assessment  Palliative Care Outcomes      Patient Active Problem List   Diagnosis Date Noted  . Typical atrial flutter ([place])   . Acute on chronic combined systolic and diastolic CHF (congestive heart failure) ([place])   . Atrial fibrillation ([place]) 02/10/2018  . Atrial fibrillation with RVR ([place]) 02/09/2018  . Acute encephalopathy 02/09/2018  . CKD (chronic kidney disease), stage III ([place]) 02/09/2018  . Elevated troponin   . Anemia 02/26/2012  . OSTEOARTHRITIS, KNEE, RIGHT 03/13/2009  . VITAMIN D DEFICIENCY 01/25/2009  . ALLERGIC RHINITIS 07/20/2008  . RENAL FAILURE 02/15/2008  . ARTHRITIS, HX OF 09/15/2006  . OSTEOPOROSIS 09/09/2006  . HYPERTENSION 09/07/2006  . GERD 09/07/2006  . BREAST CANCER, HX OF 09/07/2006    Palliative Care Assessment & Plan    Recommendations/Plan:  Recommend palliative at D/C.     Code Status:    Code Status Orders  (From admission, onward)         Start     Ordered   02/11/18 1252  Do not attempt resuscitation (DNR)  Continuous    Question Answer Comment  In the event of cardiac or respiratory ARREST Do not call a "code blue"   In the event of cardiac or respiratory ARREST Do not perform Intubation, CPR, defibrillation or ACLS   In the event  of cardiac or respiratory ARREST Use medication by any route, position, wound care, and other measures to relive pain and suffering. May use oxygen, suction and manual treatment of airway obstruction as needed for comfort.      02/11/18 1251        Code Status History    Date Active Date Inactive Code Status Order ID Comments User Context   02/09/2018 2342 02/11/2018 1251 Full Code [id number]  Rise Patience, MD ED    Advance Directive Documentation     Most Recent Value  Type of Advance Directive  Living will  Pre-existing out of facility DNR order (yellow form or pink MOST form)  -  "MOST" Form in Place?  -       Prognosis:   Unable to determine  Discharge Planning:  To Be Determined    Thank you for allowing the Palliative Medicine Team to assist in the care of this patient.   Time In: 2:00 Time Out: 2:50 Total Time 50 min Prolonged Time Billed no      Greater than 50%  of this time was spent counseling and coordinating care related to the above assessment and plan.  Asencion Gowda, NP  Please contact Palliative Medicine Team phone at [phone] for questions and concerns.

## 2018-02-19 NOTE — Plan of Care (Signed)
  Problem: Clinical Measurements: Goal: Respiratory complications will improve Outcome: Progressing Note:  Stable on 2L/[place]

## 2018-02-19 NOTE — NC FL2 (Signed)
[place] MEDICAID FL2 LEVEL OF CARE SCREENING TOOL     IDENTIFICATION  Patient Name: [place]: 06-Mar-1927 Sex: female Admission Date (Current Location): 02/09/2018  [facility] and [place] Number:  Herbalist and Address:  [facility], [place] [address], [place], [place] [address]      Provider Number: [id number]  Attending Physician Name and Address:  Cristal Ford, DO  Relative Name and Phone Number:  Angelica Pou, daughter, [phone]    Current Level of Care: Hospital Recommended Level of Care: Power Prior Approval Number:    Date Approved/Denied:   PASRR Number: [id number]  Discharge Plan: SNF    Current Diagnoses: Patient Active Problem List   Diagnosis Date Noted  . Typical atrial flutter ([place])   . Acute on chronic combined systolic and diastolic CHF (congestive heart failure) ([place])   . Atrial fibrillation ([place]) 02/10/2018  . Atrial fibrillation with RVR ([place]) 02/09/2018  . Acute encephalopathy 02/09/2018  . CKD (chronic kidney disease), stage III ([place]) 02/09/2018  . Elevated troponin   . Anemia 02/26/2012  . OSTEOARTHRITIS, KNEE, RIGHT 03/13/2009  . VITAMIN D DEFICIENCY 01/25/2009  . ALLERGIC RHINITIS 07/20/2008  . RENAL FAILURE 02/15/2008  . ARTHRITIS, HX OF 09/15/2006  . OSTEOPOROSIS 09/09/2006  . HYPERTENSION 09/07/2006  . GERD 09/07/2006  . BREAST CANCER, HX OF 09/07/2006    Orientation RESPIRATION BLADDER Height & Weight     Self, Situation, Place  O2(nasal cannula 2L) Continent, External catheter Weight: 65.1 kg Height:  5' (152.4 cm)  BEHAVIORAL SYMPTOMS/MOOD NEUROLOGICAL BOWEL NUTRITION STATUS      Continent Diet(please see DC summary)  AMBULATORY STATUS COMMUNICATION OF NEEDS Skin   Limited Assist Verbally Normal                       Personal Care Assistance Level of Assistance  Bathing, Feeding, Dressing Bathing Assistance: Limited assistance Feeding  assistance: Independent Dressing Assistance: Limited assistance     Functional Limitations Info  Sight, Hearing, Speech Sight Info: Adequate Hearing Info: Adequate Speech Info: Adequate    SPECIAL CARE FACTORS FREQUENCY  PT (By licensed PT), OT (By licensed OT)     PT Frequency: 5x/week OT Frequency: 5x/week            Contractures Contractures Info: Not present    Additional Factors Info  Code Status, Allergies Code Status Info: DNR Allergies Info: Prednisone           Current Medications (02/19/2018):  This is the current hospital active medication list Current Facility-Administered Medications  Medication Dose Route Frequency Provider Last Rate Last Dose  . acetaminophen (TYLENOL) tablet 650 mg  650 mg Oral Q6H PRN Regalado, Belkys A, MD       Or  . acetaminophen (TYLENOL) suppository 650 mg  650 mg Rectal Q6H PRN Regalado, Belkys A, MD      . allopurinol (ZYLOPRIM) tablet 100 mg  100 mg Oral Daily Regalado, Belkys A, MD   100 mg at 02/18/18 0942  . alum & mag hydroxide-simeth (MAALOX/MYLANTA) 200-200-20 MG/5ML suspension 30 mL  30 mL Oral Q4H PRN Cristal Ford, DO   30 mL at 02/18/18 1205  . furosemide (LASIX) injection 80 mg  80 mg Intravenous Q12H Nahser, Wonda Cheng, MD   80 mg at 02/18/18 2134  . heparin injection 5,000 Units  5,000 Units Subcutaneous Q8H Cristal Ford, DO   5,000 Units at 02/19/18 0602  . metoprolol tartrate (  LOPRESSOR) tablet 100 mg  100 mg Oral BID Nahser, Wonda Cheng, MD      . metoprolol tartrate (LOPRESSOR) tablet 50 mg  50 mg Oral Once Nahser, Wonda Cheng, MD      . ondansetron [facility]) tablet 4 mg  4 mg Oral Q6H PRN Regalado, Belkys A, MD       Or  . ondansetron (ZOFRAN) injection 4 mg  4 mg Intravenous Q6H PRN Regalado, Belkys A, MD      . spironolactone (ALDACTONE) tablet 12.5 mg  12.5 mg Oral Daily Nahser, Wonda Cheng, MD   12.5 mg at 02/18/18 4834     Discharge Medications: Please see discharge summary for a list of discharge  medications.  Relevant Imaging Results:  Relevant Lab Results:   Additional Information SSN: [id number]  Estanislado Emms, LCSW

## 2018-02-19 NOTE — Progress Notes (Signed)
Progress Note  Patient Name: Kendra Bray Date of Encounter: 02/19/2018  Primary Cardiologist: Sanda Klein, MD    Subjective   85 year old female with a history of chronic kidney disease, hypertension, old stroke, breast cancer, obesity who is seen for further evaluation of atrial fibrillation with a rapid ventricular response and congestive heart failure.  Echocardiogram from January 15 reveals severely depressed left ventricular systolic function with an ejection fraction of 15 to 20%.  She has diffuse hypokinesis.  She has moderate pulmonary hypertension with an estimated PA pressure of 51 mmHg.  She continues to diurese.  She has been seen by palliative care. She is diuresed a total of 4.5 L so far during this admission. Remains very weak.   Inpatient Medications    Scheduled Meds: . allopurinol  100 mg Oral Daily  . furosemide  80 mg Intravenous Q12H  . heparin injection (subcutaneous)  5,000 Units Subcutaneous Q8H  . metoprolol tartrate  50 mg Oral TID  . spironolactone  12.5 mg Oral Daily   Continuous Infusions:  PRN Meds: acetaminophen **OR** acetaminophen, alum & mag hydroxide-simeth, ondansetron **OR** ondansetron (ZOFRAN) IV   Vital Signs    Vitals:   02/18/18 1712 02/18/18 2000 02/19/18 0602 02/19/18 0750  BP: 108/86 102/78 99/71   Pulse: (!) 115 (!) 114 (!) 121 (!) 117  Resp:  15 14 15   Temp:  98.2 F (36.8 C) 98.1 F (36.7 C)   TempSrc:  Oral Oral   SpO2:  99% 96% 97%  Weight:   65.1 kg   Height:        Intake/Output Summary (Last 24 hours) at 02/19/2018 0850 Last data filed at 02/19/2018 0500 Gross per 24 hour  Intake 120 ml  Output 1275 ml  Net -1155 ml   Last 3 Weights 02/19/2018 02/18/2018 02/17/2018  Weight (lbs) 143 lb 8.3 oz 145 lb 1 oz 151 lb 3.8 oz  Weight (kg) 65.1 kg 65.8 kg 68.6 kg      Telemetry    Sinus tach - Personally Reviewed  ECG      Physical Exam   Physical Exam: Blood pressure 99/71, pulse (!) 117,  temperature 98.1 F (36.7 C), temperature source Oral, resp. rate 15, height 5' (1.524 m), weight 65.1 kg, SpO2 97 %.  GEN:   Elderly, chronically ill-appearing female, no acute distress. HEENT: Normal NECK: No JVD; No carotid bruits LYMPHATICS: No lymphadenopathy CARDIAC: Rate S1-S2.  She is mildly tachycardic. RESPIRATORY:  Clear to auscultation without rales, wheezing or rhonchi  ABDOMEN: Soft, non-tender, non-distended MUSCULOSKELETAL:  No edema; No deformity  SKIN: Warm and dry NEUROLOGIC: Generally very weak.    Labs    Chemistry Recent Labs  Lab 02/17/18 0412 02/18/18 0451 02/19/18 0444  NA 144 142 143  K 3.0* 4.3 5.8*  CL 101 99 98  CO2 29 31 30   GLUCOSE 116* 154* 108*  BUN 61* 55* 63*  CREATININE 1.78* 1.61* 1.92*  CALCIUM 9.6 10.0 10.7*  GFRNONAA 25* 28* 23*  GFRAA 29* 33* 26*  ANIONGAP 14 12 15      Hematology Recent Labs  Lab 02/16/18 0746 02/17/18 0412 02/18/18 0451 02/19/18 0444  WBC 13.1* 14.1* 13.2*  --   RBC 4.44 4.63 4.95  --   HGB 14.0 14.7 15.7* 16.4*  HCT 42.9 44.3 48.3* 50.7*  MCV 96.6 95.7 97.6  --   MCH 31.5 31.7 31.7  --   MCHC 32.6 33.2 32.5  --   RDW 16.4* 16.3* 16.6*  --  PLT 180 186 202  --     Cardiac Enzymes No results for input(s): TROPONINI in the last 168 hours. No results for input(s): TROPIPOC in the last 168 hours.   BNPNo results for input(s): BNP, PROBNP in the last 168 hours.   DDimer No results for input(s): DDIMER in the last 168 hours.   Radiology    Dg Abd 1 View  Result Date: 02/18/2018 CLINICAL DATA:  Vomiting. EXAM: ABDOMEN - 1 VIEW COMPARISON:  No recent prior. FINDINGS: Soft tissue structures are unremarkable. No bowel distention. No free air. Calcifications in the pelvis consistent phleboliths. Degenerative changes lumbar spine and both hips. IMPRESSION: No acute abnormality. Electronically Signed   By: Marcello Moores  Register   On: 02/18/2018 13:34    Cardiac Studies     Patient Profile     85 y.o.  female   [place]     1.  Acute on chronic combined systolic and diastolic congestive heart failure: Patient has an ejection fraction of 15% by echo.  I have personally reviewed the echocardiogram.    She continues to diurese. Continue metoprolol.  We will gradually increase the dose. Her renal function remains quite variable and she has at least stage III chronic kidney disease.  I do not think that we will be able to start an ACE inhibitor/ARB.  2.  Atrial flutter fibrillation: Has paroxysmal atrial fibrillation.  She is in normal sinus rhythm currently.  She is a very poor candidate for chronic oral anticoagulation.  3.  Acute on chronic renal insufficiency:    Threatening his back up to 1.9 today.  Further plans per internal medicine team.  5.  Hyperkalemia: Patient was hypokalemic earlier this week.  We added Aldactone 12/5 daily .  She was continued on her potassium supplement briefly with daily BMP levels .  She now has a potassium level of 5.8.  The potassium supplementation has been stopped.  We will recheck a basic metabolic profile this evening at 4 PM. Will continue aldactone if possible but will Dc if potassium level contiues to rise   For questions or updates, please contact [place] Please consult [email or web address] for contact info under         Mertie Moores, MD  02/19/2018 8:55 AM    [address] [place],  [place], [place]  [address] Pager [phone] Phone: [phone]; Fax: [phone]

## 2018-02-19 NOTE — Clinical Social Work Note (Addendum)
Clinical Social Work Assessment  Patient Details  Name: Kendra Bray MRN: [id number] Date of Birth: 14-Jun-1927  Date of referral:  02/19/18               Reason for consult:  Facility Placement, Discharge Planning                Permission sought to share information with:  Facility Sport and exercise psychologist, Family Supports Permission granted to share information::  Yes, Verbal Permission Granted  Name::     Kendra Bray  Agency::  SNFs  Relationship::  daughter  Contact Information:  [phone]  Housing/Transportation Living arrangements for the past 2 months:  Single Family Home Source of Information:  Patient, Adult Children Patient Interpreter Needed:  None Criminal Activity/Legal Involvement Pertinent to Current Situation/Hospitalization:  No - Comment as needed Significant Relationships:  Adult Children, Other Family Members Lives with:  Self Do you feel safe going back to the place where you live?  Yes Need for family participation in patient care:  Yes (Comment)  Care giving concerns: Patient from home independently. PT recommending SNF.    Social Worker assessment / plan: CSW met with patient, daughter, and son-in-law at bedside. Patient alert and oriented. CSW introduced self and role and discussed disposition planning - PT recommendation for SNF.  Patient's family reported that patient lives alone and usually manages well independently. Her daughter and son in law live in [place]. They want patient to go to a SNF here in [place] so she will be near her doctors for follow up. They have tentatively planned for patient to move in with them in [place] after rehab.  Patient and family are agreeable to SNF for rehab. CSW sent out initial SNF referrals. Awaiting bed offers. Will provide CMS SNF list and bed offers when available. CSW to follow and support with discharge planning.  Update 12:37 pm: Gave daughter bed offers. Daughter to review. Will follow up for  choice.  Employment status:  Retired Forensic scientist:  Medicare PT Recommendations:  [place] / Referral to community resources:  [place]  Patient/Family's Response to care: Patient and family appreciative of care.  Patient/Family's Understanding of and Emotional Response to Diagnosis, Current Treatment, and Prognosis: Patient and family with good understanding of patient's condition and care needs. They are all agreeable to SNF.  Emotional Assessment Appearance:  Appears stated age Attitude/Demeanor/Rapport:  Engaged Affect (typically observed):  Accepting, Calm, Appropriate Orientation:  Oriented to Self, Oriented to Place, Oriented to  Time, Oriented to Situation Alcohol / Substance use:  Not Applicable Psych involvement (Current and /or in the community):  No (Comment)  Discharge Needs  Concerns to be addressed:  Discharge Planning Concerns, Care Coordination Readmission within the last 30 days:  No Current discharge risk:  Physical Impairment Barriers to Discharge:  Continued Medical Work up   Estanislado Emms, LCSW 02/19/2018, 11:22 AM

## 2018-02-19 NOTE — Progress Notes (Signed)
PROGRESS NOTE    Kendra Bray [facility]  UKG:[id number] DOB: 03-15-1927 DOA: 02/09/2018 PCP: Dorena Cookey, MD   Brief Narrative:  HPI on 02/09/2018 by Dr. Gean Birchwood Kendra Bray is a 85 y.o. female with history of chronic kidney disease, breast cancer in remission, gout was brought to the ER after patient was found to be confused.  As per the family who discussed with the ER physician patient has not been able to be reached for the last 4 days and fashion the daughter had arrived from [place] to check on the patient.  Patient was found to be confused and was brought to the ER.  As per the family patient also was having some hallucinations.  Patient did not complain of any headache chest pain or shortness of breath nausea vomiting or diarrhea.  Interim history Patient was admitted with confusion found to have atrial fibrillation with RVR.  She was also found to have a cool right lower extremity and transferred to [facility] for vascular evaluation.  Cardiology was also consulted as patient had systolic heart failure exacerbation. Assessment & Plan   Paroxysmal atrial fibrillation with RVR -Cardiology consulted and appreciated -Patient was started on Cardizem drip initially however this was transitioned to oral metoprolol -Patient noted to have hypotension and bradycardia on oral Cardizem -Discussed at length today with Dr. Acie Fredrickson, cardiology, given her current status, age, limited mobility, patient likely not able to have frequent INR checks in the outpatient setting, renal function will not allow for newer medications. -will hold heparin drip and anticoagulation  -Cardiology started amiodarone drip on 02/17/2018 but discontinued on 02/18/2018  -Currently sinus tachycadia -placed on metoprolol, cardiology will increase dose gradually   Acute metabolic encephalopathy -Appears to be at baseline today -MRI brain was negative for acute infarct -Vitamin B12 2568 -TSH was slightly  elevated however normal T3 and T4 -Chest x-ray and UA unremarkable for infection  Acute systolic heart failure -Echocardiogram showed an EF of 15 to 20% with diffuse hypokinesis. -As above, cardiology consulted and appreciated -Continue to monitor intake and output, daily weights -Was given IV Lasix on 02/14/2018 and made little urine output, she was subsequently placed on bicarb drip.   -Was on Lasix drip however now converted to 80 mg IV twice daily  -Cardiac catheterization plan on hold for now  Mildly elevated troponins -Most likely secondary to demand ischemia  Right lower extremity ischemia -Ruled out -Vascular surgery did not appreciated.  No evidence of occlusive arterial disease on imaging.  No further work-up needed as per vascular surgery  Acute kidney injury on chronic kidney disease, stage III with metabolic acidosis -Baseline creatinine approximately 1.6-2 -Currently on IV Lasix.  Bicarb drip was discontinued -Creatinine currently 1.92 -Continue to monitor BMP  Hypokalemia/Hypokalemia -Magnesium 2.4 after supplementation -repeat BMP later today  Leukocytosis -Reactive to the above, continue to monitor CBC  Generalized deconditioning -Overall prognosis is guarded to poor.  Palliative care has been consulted. -Patient currently DNR.  Will continue to treat the treatable at this time.  On discharge, recommend palliative care to follow  Vomiting -Patient complains of vomiting, without nausea or abdominal pain -Question whether this is due to amiodarone -Continue maalox  -KUB unremarkable   DVT Prophylaxis  Heparin SQ  Code Status: DNR  Family Communication: None at bedside  Disposition Plan: Admitted. Transitioned to IV lasix from drip. Pending further recommendations from cardiology. Dispo TBD  Consultants Cardiology Vascular surgery Palliative care  Procedures  Echocardiogram Lower extremity Doppler,  ABI  Antibiotics   Anti-infectives (From  admission, onward)   None      Subjective:   [name] seen and examined today.  She has had no further vomiting this morning.  Denies current chest pain, shortness breath, abdominal pain, nausea, diarrhea constipation, dizziness or headache.  Objective:   Vitals:   02/18/18 2000 02/19/18 0602 02/19/18 0750 02/19/18 1305  BP: 102/78 99/71  105/73  Pulse: (!) 114 (!) 121 (!) 117 (!) 118  Resp: 15 14 15 13   Temp: 98.2 F (36.8 C) 98.1 F (36.7 C)  98 F (36.7 C)  TempSrc: Oral Oral  Oral  SpO2: 99% 96% 97% 98%  Weight:  65.1 kg    Height:        Intake/Output Summary (Last 24 hours) at 02/19/2018 1307 Last data filed at 02/19/2018 1306 Gross per 24 hour  Intake 180 ml  Output 1675 ml  Net -1495 ml   Filed Weights   02/17/18 0534 02/18/18 0522 02/19/18 0602  Weight: 68.6 kg 65.8 kg 65.1 kg   Exam  General: Well developed, chronically ill appearing, NAD  HEENT: NCAT, mucous membranes moist.   Neck: Supple  Cardiovascular: S1 S2 auscultated, tachycardic, no murmur  Respiratory: Clear to auscultation bilaterally with equal chest rise  Abdomen: Soft, nontender, nondistended, + bowel sounds  Extremities: warm dry without cyanosis clubbing or edema  Neuro: AAOx3, nonfocal  Psych: Normal affect and demeanor  Data Reviewed: I have personally reviewed following labs and imaging studies  CBC: Recent Labs  Lab 02/14/18 0402 02/15/18 0357 02/16/18 0746 02/17/18 0412 02/18/18 0451 02/19/18 0444  WBC 14.6* 14.5* 13.1* 14.1* 13.2*  --   HGB 13.5 13.1 14.0 14.7 15.7* 16.4*  HCT 43.2 40.8 42.9 44.3 48.3* 50.7*  MCV 102.4* 99.3 96.6 95.7 97.6  --   PLT 274 216 180 186 202  --    Basic Metabolic Panel: Recent Labs  Lab 02/14/18 0402  02/15/18 0357 02/16/18 0746 02/17/18 0412 02/18/18 0451 02/19/18 0444  NA 138   < > 138 139 144 142 143  K 5.7*   < > 4.1 3.5 3.0* 4.3 5.8*  CL 113*   < > 112* 103 101 99 98  CO2 13*   < > 12* 23 29 31 30   GLUCOSE 142*    < > 178* 127* 116* 154* 108*  BUN 63*   < > 79* 72* 61* 55* 63*  CREATININE 2.03*   < > 2.50* 2.14* 1.78* 1.61* 1.92*  CALCIUM 9.6   < > 9.3 9.1 9.6 10.0 10.7*  MG 2.4  --  2.2 1.9 1.7 2.4  --    < > = values in this interval not displayed.   GFR: Estimated Creatinine Clearance: 17 mL/min (A) (by C-G formula based on SCr of 1.92 mg/dL (H)). Liver Function Tests: No results for input(s): AST, ALT, ALKPHOS, BILITOT, PROT, ALBUMIN in the last 168 hours. No results for input(s): LIPASE, AMYLASE in the last 168 hours. No results for input(s): AMMONIA in the last 168 hours. Coagulation Profile: No results for input(s): INR, PROTIME in the last 168 hours. Cardiac Enzymes: No results for input(s): CKTOTAL, CKMB, CKMBINDEX, TROPONINI in the last 168 hours. BNP (last 3 results) No results for input(s): PROBNP in the last 8760 hours. HbA1C: No results for input(s): HGBA1C in the last 72 hours. CBG: Recent Labs  Lab 02/16/18 1119  GLUCAP 145*   Lipid Profile: No results for input(s): CHOL, HDL, LDLCALC, TRIG, CHOLHDL,  LDLDIRECT in the last 72 hours. Thyroid Function Tests: No results for input(s): TSH, T4TOTAL, FREET4, T3FREE, THYROIDAB in the last 72 hours. Anemia Panel: No results for input(s): VITAMINB12, FOLATE, FERRITIN, TIBC, IRON, RETICCTPCT in the last 72 hours. Urine analysis:    Component Value Date/Time   COLORURINE YELLOW 02/09/2018 2003   APPEARANCEUR CLEAR 02/09/2018 2003   LABSPEC 1.019 02/09/2018 2003   PHURINE 5.0 02/09/2018 2003   GLUCOSEU NEGATIVE 02/09/2018 2003   HGBUR NEGATIVE 02/09/2018 2003   HGBUR negative 02/14/2010 1317   BILIRUBINUR NEGATIVE 02/09/2018 2003   BILIRUBINUR n 02/28/2013 1112   KETONESUR 5 (A) 02/09/2018 2003   PROTEINUR NEGATIVE 02/09/2018 2003   UROBILINOGEN 0.2 02/28/2013 1112   UROBILINOGEN 0.2 02/14/2010 1317   NITRITE NEGATIVE 02/09/2018 2003   LEUKOCYTESUR NEGATIVE 02/09/2018 2003   Sepsis  Labs: @LABRCNTIP (procalcitonin:4,lacticidven:4)  ) Recent Results (from the past 240 hour(s))  Blood culture (routine x 2)     Status: None   Collection Time: 02/09/18  4:38 PM  Result Value Ref Range Status   Specimen Description   Final    BLOOD RIGHT HAND Performed at [facility], [place] [address] [place], [place] [address]    Special Requests   Final    BOTTLES DRAWN AEROBIC AND ANAEROBIC Blood Culture results may not be optimal due to an inadequate volume of blood received in culture bottles Performed at [place] [address], [place], [place] [address]    Culture   Final    NO GROWTH 5 DAYS Performed at [facility], [place] [address], [place], [place] [address]    Report Status 02/15/2018 FINAL  Final  Blood culture (routine x 2)     Status: None   Collection Time: 02/09/18  4:42 PM  Result Value Ref Range Status   Specimen Description   Final    BLOOD RIGHT ANTECUBITAL Performed at [place] [address], [place], [place] [address]    Special Requests   Final    BOTTLES DRAWN AEROBIC AND ANAEROBIC Blood Culture results may not be optimal due to an excessive volume of blood received in culture bottles Performed at [place] [address], [place], [place] [address]    Culture   Final    NO GROWTH 5 DAYS Performed at [facility], [place] [address] [place],  [address]    Report Status 02/14/2018 FINAL  Final      Radiology Studies: Dg Abd 1 View  Result Date: 02/18/2018 CLINICAL DATA:  Vomiting. EXAM: ABDOMEN - 1 VIEW COMPARISON:  No recent prior. FINDINGS: Soft tissue structures are unremarkable. No bowel distention. No free air. Calcifications in the pelvis consistent phleboliths. Degenerative changes lumbar spine and both hips. IMPRESSION: No acute abnormality. Electronically Signed   By: [place]   On: 02/18/2018 13:34     Scheduled Meds: .  allopurinol  100 mg Oral Daily  . furosemide  80 mg Intravenous Q12H  . heparin injection (subcutaneous)  5,000 Units Subcutaneous Q8H  . metoprolol tartrate  100 mg Oral BID  . spironolactone  12.5 mg Oral Daily   Continuous Infusions:    LOS: 10 days   Time Spent in minutes   30 minutes   Teruko Joswick D.O. on 02/19/2018 at 1:07 PM  Between 7am to 7pm - Please see pager noted on [email or web address]  After 7pm go to [email or web address]  And look for the night coverage person  covering for me after hours  [facility] Office  [phone]

## 2018-02-20 DIAGNOSIS — I519 Heart disease, unspecified: Secondary | ICD-10-CM

## 2018-02-20 DIAGNOSIS — E875 Hyperkalemia: Secondary | ICD-10-CM

## 2018-02-20 LAB — BASIC METABOLIC PANEL
Anion gap: 15 (ref 5–15)
BUN: 71 mg/dL — ABNORMAL HIGH (ref 8–23)
CO2: 36 mmol/L — ABNORMAL HIGH (ref 22–32)
Calcium: 10.9 mg/dL — ABNORMAL HIGH (ref 8.9–10.3)
Chloride: 88 mmol/L — ABNORMAL LOW (ref 98–111)
Creatinine, Ser: 2.22 mg/dL — ABNORMAL HIGH (ref 0.44–1.00)
GFR calc Af Amer: 22 mL/min — ABNORMAL LOW (ref >60)
GFR, EST NON AFRICAN AMERICAN: 19 mL/min — AB (ref >60)
GLUCOSE: 134 mg/dL — AB (ref 70–99)
Potassium: 5 mmol/L (ref 3.5–5.1)
Sodium: 139 mmol/L (ref 135–145)

## 2018-02-20 MED ORDER — FUROSEMIDE 80 MG PO TABS
80.0000 mg | ORAL_TABLET | Freq: Two times a day (BID) | ORAL | Status: DC
  Filled 2018-02-20: qty 1

## 2018-02-20 NOTE — Progress Notes (Addendum)
Progress Note  Patient Name: Kendra Bray Date of Encounter: 02/20/2018  Primary Cardiologist: Sanda Klein, MD   Subjective   Aware she is in the hospital, her daughter was here yesterday. No pain or SOB.  Inpatient Medications    Scheduled Meds: . allopurinol  100 mg Oral Daily  . furosemide  80 mg Intravenous Q12H  . heparin injection (subcutaneous)  5,000 Units Subcutaneous Q8H  . metoprolol tartrate  100 mg Oral BID  . spironolactone  12.5 mg Oral Daily   Continuous Infusions:  PRN Meds: acetaminophen **OR** acetaminophen, alum & mag hydroxide-simeth, ondansetron **OR** ondansetron (ZOFRAN) IV   Vital Signs    Vitals:   02/19/18 1305 02/19/18 1956 02/19/18 2200 02/20/18 0422  BP: 105/73 113/74 104/81 99/70  Pulse: (!) 118 (!) 120 (!) 119 (!) 118  Resp: 13 19 18 11   Temp: 98 F (36.7 C) 97.7 F (36.5 C)  97.6 F (36.4 C)  TempSrc: Oral Axillary  Axillary  SpO2: 98% 98% 99% 96%  Weight:    64 kg  Height:        Intake/Output Summary (Last 24 hours) at 02/20/2018 0802 Last data filed at 02/20/2018 0553 Gross per 24 hour  Intake 920 ml  Output 1150 ml  Net -230 ml   Last 3 Weights 02/20/2018 02/19/2018 02/18/2018  Weight (lbs) 141 lb 143 lb 8.3 oz 145 lb 1 oz  Weight (kg) 63.957 kg 65.1 kg 65.8 kg      Telemetry    Question sinus tach instead of atrial flutter - Personally Reviewed  ECG    Atrial fibrillation - Personally Reviewed  Physical Exam   GEN: No acute distress.   Neck: No JVD Cardiac: tachycardic, no murmurs, rubs, or gallops.  Respiratory: Clear to auscultation bilaterally, mild crackles in the R base which is the dependent side.  GI: Soft, nontender, non-distended  MS: No edema; No deformity. Neuro:  Nonfocal  Psych: Normal affect   Labs    Chemistry Recent Labs  Lab 02/19/18 0444 02/19/18 1611 02/20/18 0328  NA 143 139 139  K 5.8* 4.9 5.0  CL 98 92* 88*  CO2 30 32 36*  GLUCOSE 108* 175* 134*  BUN 63* 67* 71*    CREATININE 1.92* 1.99* 2.22*  CALCIUM 10.7* 10.6* 10.9*  GFRNONAA 23* 22* 19*  GFRAA 26* 25* 22*  ANIONGAP 15 15 15      Hematology Recent Labs  Lab 02/16/18 0746 02/17/18 0412 02/18/18 0451 02/19/18 0444  WBC 13.1* 14.1* 13.2*  --   RBC 4.44 4.63 4.95  --   HGB 14.0 14.7 15.7* 16.4*  HCT 42.9 44.3 48.3* 50.7*  MCV 96.6 95.7 97.6  --   MCH 31.5 31.7 31.7  --   MCHC 32.6 33.2 32.5  --   RDW 16.4* 16.3* 16.6*  --   PLT 180 186 202  --     Cardiac EnzymesNo results for input(s): TROPONINI in the last 168 hours. No results for input(s): TROPIPOC in the last 168 hours.   BNPNo results for input(s): BNP, PROBNP in the last 168 hours.   DDimer No results for input(s): DDIMER in the last 168 hours.   Radiology    Dg Abd 1 View  Result Date: 02/18/2018 CLINICAL DATA:  Vomiting. EXAM: ABDOMEN - 1 VIEW COMPARISON:  No recent prior. FINDINGS: Soft tissue structures are unremarkable. No bowel distention. No free air. Calcifications in the pelvis consistent phleboliths. Degenerative changes lumbar spine and both hips. IMPRESSION: No  acute abnormality. Electronically Signed   By: Marcello Moores  Register   On: 02/18/2018 13:34    Cardiac Studies   Echo 02/10/2018 LV EF: 15% -   20% Study Conclusions  - Left ventricle: The cavity size was moderately dilated. Wall   thickness was increased in a pattern of mild LVH. Systolic   function was severely reduced. The estimated ejection fraction   was in the range of 15% to 20%. Diffuse hypokinesis. - Mitral valve: Mildly calcified annulus. Mildly thickened, mildly   calcified leaflets . There was moderate regurgitation. - Left atrium: The atrium was moderately dilated. - Right ventricle: The cavity size was moderately dilated. Systolic   function was moderately reduced. - Tricuspid valve: There was mild-moderate regurgitation. - Pulmonary arteries: Systolic pressure was moderately increased.   PA peak pressure: 51 mm Hg (S).   Patient  Profile     85 y.o. female with PMH of remote L mastectomy for breast CA, CKD stage III, HTN presented with new atrial flutter with RVR and AMS. Apparently daughter could not reach her for 4 days prior to arrival, so her daughter came from [place] to check on her. She was confused, had hallucination and speak nonsensically, therefore she was sent to the hospital. Echo showed significant LV dysfunction. Brain MRI showed old stroke. She was evaluated by vascular with RLE coldness, but U/S did not show any LE arterial blockage.   Assessment & Plan    1. Acute combined systolic and diastolic CHF/LV dysfunction: EF 15-20% on echo 02/10/2018.   - initially planned left and right heart cath, however this was held off due to significant altered mental status.   - undergoing IV diuresis. She appears to be euvolemic, maybe even a little dry. Recommend stop IV lasix, lasix holiday today, start PO lasix 80mg  BID tomorrow.  2. Atrial flutter with RVR: CHA2DS2-Vasc score 7 (age 51, female 1, CHF 1, CVA 2, HTN 1).    - did have a short episode of afib as well.   - she is not a good candidate for coumadin, dosing for Xarelto and eliquis also difficult, based on note by Dr. Acie Fredrickson on 1/22, stop IV heparin and not restart oral anticoagulation  3. AMS: unclear reason, MRI of brain showed old R frontal lobe infarct associated with encephalomalacia, no acute intracranial abnormality  4. Acute on chronic renal insufficiency, stage III/IV: Cr on admission was 1.9 on arrival, Cr 1.67 today.  5. Elevated troponin: Cath held off. Felt elevated troponin is primarily related to CHF  6. Hyperkalemia: if K continue to rise, stop spironolactone.          For questions or updates, please contact [place] Please consult [email or web address] for contact info under        Signed, Almyra Deforest, [place]  02/20/2018, 8:02 AM    The patient was seen and examined, and I agree with the history, physical exam, assessment and plan  as documented above, with modifications as noted below. I have also personally reviewed and independently interpreted old and new ECG's.  She talk to me about her granddaughter this morning.  I spoke to the hospitalist about the patient's reduced ejection fraction, tachycardia, and worsening renal function.  She appears to be euvolemic and perhaps on the dry side and this may be responsible for the patient's sinus tachycardia.  I do not appreciate flutter waves on telemetry.  I will obtain an ECG today for clarification.  We will stop IV Lasix and plan to  start oral Lasix 80 mg twice daily tomorrow.  We will also stop spironolactone given worsening renal function.  She is not a good anticoagulation candidate. She is on the maximum dose of metoprolol tartrate, 100 mg twice daily.  Stopping the Lasix and spironolactone today may help to regulate heart rates.  Unable to use calcium channel blockers due to severely reduced left ventricular systolic function.  I would avoid digoxin as well given poor renal function.   Kate Sable, MD, [facility]  02/20/2018 8:33 AM

## 2018-02-20 NOTE — Plan of Care (Signed)
Monitor VSS. HR remains Tachycardic. Admin BB per md order.

## 2018-02-20 NOTE — Progress Notes (Signed)
PROGRESS NOTE    Kendra Bray [facility]  KDT:[id number] DOB: 01/06/1928 DOA: 02/09/2018 PCP: Dorena Cookey, MD   Brief Narrative:  HPI on 02/09/2018 by Dr. Gean Birchwood Kendra Bray is a 85 y.o. female with history of chronic kidney disease, breast cancer in remission, gout was brought to the ER after patient was found to be confused.  As per the family who discussed with the ER physician patient has not been able to be reached for the last 4 days and fashion the daughter had arrived from [place] to check on the patient.  Patient was found to be confused and was brought to the ER.  As per the family patient also was having some hallucinations.  Patient did not complain of any headache chest pain or shortness of breath nausea vomiting or diarrhea.  Interim history Patient was admitted with confusion found to have atrial fibrillation with RVR.  She was also found to have a cool right lower extremity and transferred to [facility] for vascular evaluation.  Cardiology was also consulted as patient had systolic heart failure exacerbation. Assessment & Plan   Paroxysmal atrial fibrillation with RVR -Cardiology consulted and appreciated -Patient was started on Cardizem drip initially however this was transitioned to oral metoprolol -Patient noted to have hypotension and bradycardia on oral Cardizem -Discussed at length today with Dr. Acie Fredrickson, cardiology, given her current status, age, limited mobility, patient likely not able to have frequent INR checks in the outpatient setting, renal function will not allow for newer medications. -will hold heparin drip and anticoagulation  -Cardiology started amiodarone drip on 02/17/2018 but discontinued on 02/18/2018  -Currently sinus tachycadia -Continue metoprolol, 100 mg twice daily -Unable to use CCB due to reduced LV systolic function.  Digoxin being avoided given her poor renal function  Acute metabolic encephalopathy -Appears to be at baseline  today -MRI brain was negative for acute infarct -Vitamin B12 2568 -TSH was slightly elevated however normal T3 and T4 -Chest x-ray and UA unremarkable for infection  Acute systolic heart failure -Echocardiogram showed an EF of 15 to 20% with diffuse hypokinesis. -As above, cardiology consulted and appreciated -Continue to monitor intake and output, daily weights -Was given IV Lasix on 02/14/2018 and made little urine output, she was subsequently placed on bicarb drip.   -Transition to IV Lasix 80 mg twice daily, per cardiology will discontinue today and likely start oral Lasix 80 mg twice daily on 02/21/2018 -Cardiac catheterization plan on hold for now -spironolactone held  Mildly elevated troponins -Most likely secondary to demand ischemia  Right lower extremity ischemia -Ruled out -Vascular surgery did not appreciated.  No evidence of occlusive arterial disease on imaging.  No further work-up needed as per vascular surgery  Acute kidney injury on chronic kidney disease, stage III with metabolic acidosis -Baseline creatinine approximately 1.6-2 -Currently on IV Lasix.  Bicarb drip was discontinued -Creatinine currently 2.22  -Continue to monitor BMP  Hypokalemia/Hyperkalemia -resolved  Leukocytosis -Reactive to the above, continue to monitor CBC  Generalized deconditioning -Overall prognosis is guarded to poor.  Palliative care has been consulted. -Patient currently DNR.  Will continue to treat the treatable at this time.  On discharge, recommend palliative care to follow  Vomiting -resolved -Patient complains of vomiting, without nausea or abdominal pain -Question whether this is due to amiodarone -Continue maalox  -KUB unremarkable   DVT Prophylaxis  Heparin SQ  Code Status: DNR  Family Communication: None at bedside  Disposition Plan: Admitted. Lasix holiday. Pending further  recommendations from cardiology  Consultants Cardiology Vascular surgery Palliative  care  Procedures  Echocardiogram Lower extremity Doppler, ABI  Antibiotics   Anti-infectives (From admission, onward)   None      Subjective:   [facility] seen and examined today.  No further episodes of vomiting.  No chest pain, shortness breath, abdominal pain, nausea, diarrhea constipation, dizziness or headache.  Objective:   Vitals:   02/19/18 1305 02/19/18 1956 02/19/18 2200 02/20/18 0422  BP: 105/73 113/74 104/81 99/70  Pulse: (!) 118 (!) 120 (!) 119 (!) 118  Resp: 13 19 18 11   Temp: 98 F (36.7 C) 97.7 F (36.5 C)  97.6 F (36.4 C)  TempSrc: Oral Axillary  Axillary  SpO2: 98% 98% 99% 96%  Weight:    64 kg  Height:        Intake/Output Summary (Last 24 hours) at 02/20/2018 1235 Last data filed at 02/20/2018 0553 Gross per 24 hour  Intake 700 ml  Output 1150 ml  Net -450 ml   Filed Weights   02/18/18 0522 02/19/18 0602 02/20/18 0422  Weight: 65.8 kg 65.1 kg 64 kg   Exam  General: Well developed, chronically ill appearing, NAD  HEENT: NCAT, mucous membranes moist.   Neck: Supple  Cardiovascular: S1 S2 auscultated, tachycardic, no murmur  Respiratory: Clear to auscultation bilaterally with equal chest rise  Abdomen: Soft, nontender, nondistended, + bowel sounds  Extremities: warm dry without cyanosis clubbing or edema  Neuro: AAOx3, nonfocal  Psych: Appropriate mood and affect  Data Reviewed: I have personally reviewed following labs and imaging studies  CBC: Recent Labs  Lab 02/14/18 0402 02/15/18 0357 02/16/18 0746 02/17/18 0412 02/18/18 0451 02/19/18 0444  WBC 14.6* 14.5* 13.1* 14.1* 13.2*  --   HGB 13.5 13.1 14.0 14.7 15.7* 16.4*  HCT 43.2 40.8 42.9 44.3 48.3* 50.7*  MCV 102.4* 99.3 96.6 95.7 97.6  --   PLT 274 216 180 186 202  --    Basic Metabolic Panel: Recent Labs  Lab 02/14/18 0402  02/15/18 0357 02/16/18 0746 02/17/18 0412 02/18/18 0451 02/19/18 0444 02/19/18 1611 02/20/18 0328  NA 138   < > 138 139 144 142  143 139 139  K 5.7*   < > 4.1 3.5 3.0* 4.3 5.8* 4.9 5.0  CL 113*   < > 112* 103 101 99 98 92* 88*  CO2 13*   < > 12* 23 29 31 30  32 36*  GLUCOSE 142*   < > 178* 127* 116* 154* 108* 175* 134*  BUN 63*   < > 79* 72* 61* 55* 63* 67* 71*  CREATININE 2.03*   < > 2.50* 2.14* 1.78* 1.61* 1.92* 1.99* 2.22*  CALCIUM 9.6   < > 9.3 9.1 9.6 10.0 10.7* 10.6* 10.9*  MG 2.4  --  2.2 1.9 1.7 2.4  --   --   --    < > = values in this interval not displayed.   GFR: Estimated Creatinine Clearance: 14.6 mL/min (A) (by C-G formula based on SCr of 2.22 mg/dL (H)). Liver Function Tests: No results for input(s): AST, ALT, ALKPHOS, BILITOT, PROT, ALBUMIN in the last 168 hours. No results for input(s): LIPASE, AMYLASE in the last 168 hours. No results for input(s): AMMONIA in the last 168 hours. Coagulation Profile: No results for input(s): INR, PROTIME in the last 168 hours. Cardiac Enzymes: No results for input(s): CKTOTAL, CKMB, CKMBINDEX, TROPONINI in the last 168 hours. BNP (last 3 results) No results for input(s): PROBNP  in the last 8760 hours. HbA1C: No results for input(s): HGBA1C in the last 72 hours. CBG: Recent Labs  Lab 02/16/18 1119  GLUCAP 145*   Lipid Profile: No results for input(s): CHOL, HDL, LDLCALC, TRIG, CHOLHDL, LDLDIRECT in the last 72 hours. Thyroid Function Tests: No results for input(s): TSH, T4TOTAL, FREET4, T3FREE, THYROIDAB in the last 72 hours. Anemia Panel: No results for input(s): VITAMINB12, FOLATE, FERRITIN, TIBC, IRON, RETICCTPCT in the last 72 hours. Urine analysis:    Component Value Date/Time   COLORURINE YELLOW 02/09/2018 2003   APPEARANCEUR CLEAR 02/09/2018 2003   LABSPEC 1.019 02/09/2018 2003   PHURINE 5.0 02/09/2018 2003   [place] 02/09/2018 2003   HGBUR NEGATIVE 02/09/2018 2003   HGBUR negative 02/14/2010 1317   BILIRUBINUR NEGATIVE 02/09/2018 2003   BILIRUBINUR n 02/28/2013 1112   KETONESUR 5 (A) 02/09/2018 2003   PROTEINUR NEGATIVE  02/09/2018 2003   UROBILINOGEN 0.2 02/28/2013 1112   UROBILINOGEN 0.2 02/14/2010 1317   NITRITE NEGATIVE 02/09/2018 2003   LEUKOCYTESUR NEGATIVE 02/09/2018 2003   Sepsis Labs: @LABRCNTIP (procalcitonin:4,lacticidven:4)  ) No results found for this or any previous visit (from the past 240 hour(s)).    Radiology Studies: Dg Abd 1 View  Result Date: 02/18/2018 CLINICAL DATA:  Vomiting. EXAM: ABDOMEN - 1 VIEW COMPARISON:  No recent prior. FINDINGS: Soft tissue structures are unremarkable. No bowel distention. No free air. Calcifications in the pelvis consistent phleboliths. Degenerative changes lumbar spine and both hips. IMPRESSION: No acute abnormality. Electronically Signed   By: [place]   On: 02/18/2018 13:34     Scheduled Meds: . allopurinol  100 mg Oral Daily  . [START ON 02/21/2018] furosemide  80 mg Oral BID  . heparin injection (subcutaneous)  5,000 Units Subcutaneous Q8H  . metoprolol tartrate  100 mg Oral BID   Continuous Infusions:    LOS: 11 days   Time Spent in minutes   30 minutes   Apryll Hinkle D.O. on 02/20/2018 at 12:35 PM  Between 7am to 7pm - Please see pager noted on [email or web address]  After 7pm go to [email or web address]  And look for the night coverage person covering for me after hours  [facility] Office  [phone]

## 2018-02-21 DIAGNOSIS — N183 Chronic kidney disease, stage 3 (moderate): Secondary | ICD-10-CM

## 2018-02-21 DIAGNOSIS — I4892 Unspecified atrial flutter: Secondary | ICD-10-CM

## 2018-02-21 DIAGNOSIS — N179 Acute kidney failure, unspecified: Secondary | ICD-10-CM

## 2018-02-21 DIAGNOSIS — E875 Hyperkalemia: Secondary | ICD-10-CM

## 2018-02-21 LAB — BASIC METABOLIC PANEL
Anion gap: 20 — ABNORMAL HIGH (ref 5–15)
BUN: 92 mg/dL — ABNORMAL HIGH (ref 8–23)
CO2: 26 mmol/L (ref 22–32)
Calcium: 10.6 mg/dL — ABNORMAL HIGH (ref 8.9–10.3)
Chloride: 89 mmol/L — ABNORMAL LOW (ref 98–111)
Creatinine, Ser: 2.1 mg/dL — ABNORMAL HIGH (ref 0.44–1.00)
GFR calc Af Amer: 24 mL/min — ABNORMAL LOW (ref >60)
GFR calc non Af Amer: 20 mL/min — ABNORMAL LOW (ref >60)
Glucose, Bld: 100 mg/dL — ABNORMAL HIGH (ref 70–99)
Potassium: 6.2 mmol/L — ABNORMAL HIGH (ref 3.5–5.1)
Sodium: 135 mmol/L (ref 135–145)

## 2018-02-21 LAB — RENAL FUNCTION PANEL
Albumin: 3.4 g/dL — ABNORMAL LOW (ref 3.5–5.0)
Anion gap: 13 (ref 5–15)
BUN: 89 mg/dL — ABNORMAL HIGH (ref 8–23)
CO2: 35 mmol/L — ABNORMAL HIGH (ref 22–32)
CREATININE: 2.29 mg/dL — AB (ref 0.44–1.00)
Calcium: 10.9 mg/dL — ABNORMAL HIGH (ref 8.9–10.3)
Chloride: 88 mmol/L — ABNORMAL LOW (ref 98–111)
GFR calc Af Amer: 21 mL/min — ABNORMAL LOW (ref >60)
GFR calc non Af Amer: 18 mL/min — ABNORMAL LOW (ref >60)
Glucose, Bld: 133 mg/dL — ABNORMAL HIGH (ref 70–99)
Phosphorus: 3.4 mg/dL (ref 2.5–4.6)
Potassium: 5.6 mmol/L — ABNORMAL HIGH (ref 3.5–5.1)
Sodium: 136 mmol/L (ref 135–145)

## 2018-02-21 LAB — CBC
HCT: 53.2 % — ABNORMAL HIGH (ref 36.0–46.0)
Hemoglobin: 17.1 g/dL — ABNORMAL HIGH (ref 12.0–15.0)
MCH: 32 pg (ref 26.0–34.0)
MCHC: 32.1 g/dL (ref 30.0–36.0)
MCV: 99.4 fL (ref 80.0–100.0)
NRBC: 0 % (ref 0.0–0.2)
PLATELETS: 198 10*3/uL (ref 150–400)
RBC: 5.35 MIL/uL — ABNORMAL HIGH (ref 3.87–5.11)
RDW: 15.8 % — ABNORMAL HIGH (ref 11.5–15.5)
WBC: 12.2 10*3/uL — ABNORMAL HIGH (ref 4.0–10.5)

## 2018-02-21 MED ORDER — SODIUM CHLORIDE 0.9 % IV SOLN
INTRAVENOUS | Status: AC
  Administered 2018-02-21: 10:00:00 via INTRAVENOUS

## 2018-02-21 NOTE — Progress Notes (Addendum)
Progress Note  Patient Name: Kendra Bray Date of Encounter: 02/21/2018  Primary Cardiologist: Sanda Klein, MD   Subjective   Denies chest pain, palpitations, and shortness of breath.  Inpatient Medications    Scheduled Meds: . allopurinol  100 mg Oral Daily  . furosemide  80 mg Oral BID  . heparin injection (subcutaneous)  5,000 Units Subcutaneous Q8H  . metoprolol tartrate  100 mg Oral BID   Continuous Infusions: . sodium chloride     PRN Meds: acetaminophen **OR** acetaminophen, alum & mag hydroxide-simeth, ondansetron **OR** ondansetron (ZOFRAN) IV   Vital Signs    Vitals:   02/20/18 2210 02/21/18 0000 02/21/18 0130 02/21/18 0430  BP: (!) 93/59 94/75 93/65  95/65  Pulse: (!) 119 (!) 118 (!) 119 (!) 117  Resp: 19 18 19  (!) 22  Temp:    (!) 97.5 F (36.4 C)  TempSrc:    Axillary  SpO2: 96% 97% 98% 98%  Weight:    65 kg  Height:        Intake/Output Summary (Last 24 hours) at 02/21/2018 0835 Last data filed at 02/21/2018 0433 Gross per 24 hour  Intake 240 ml  Output 750 ml  Net -510 ml   Filed Weights   02/19/18 0602 02/20/18 0422 02/21/18 0430  Weight: 65.1 kg 64 kg 65 kg    Telemetry    Sinus tachycardia- Personally Reviewed  ECG    No new tracings.  ECG ordered yesterday but not performed.- Personally Reviewed  Physical Exam   GEN: No acute distress, elderly and frail.   Neck: No JVD Cardiac:  Tachycardic, no murmurs, rubs, or gallops.  Respiratory: Clear to auscultation bilaterally. GI: Soft, nontender, non-distended  MS: No edema; No deformity. Neuro:  Nonfocal  Psych: Normal affect   Labs    Chemistry Recent Labs  Lab 02/19/18 1611 02/20/18 0328 02/21/18 0355  NA 139 139 135  K 4.9 5.0 6.2*  CL 92* 88* 89*  CO2 32 36* 26  GLUCOSE 175* 134* 100*  BUN 67* 71* 92*  CREATININE 1.99* 2.22* 2.10*  CALCIUM 10.6* 10.9* 10.6*  GFRNONAA 22* 19* 20*  GFRAA 25* 22* 24*  ANIONGAP 15 15 20*     Hematology Recent Labs  Lab  02/17/18 0412 02/18/18 0451 02/19/18 0444 02/21/18 0355  WBC 14.1* 13.2*  --  12.2*  RBC 4.63 4.95  --  5.35*  HGB 14.7 15.7* 16.4* 17.1*  HCT 44.3 48.3* 50.7* 53.2*  MCV 95.7 97.6  --  99.4  MCH 31.7 31.7  --  32.0  MCHC 33.2 32.5  --  32.1  RDW 16.3* 16.6*  --  15.8*  PLT 186 202  --  198    Cardiac EnzymesNo results for input(s): TROPONINI in the last 168 hours. No results for input(s): TROPIPOC in the last 168 hours.   BNPNo results for input(s): BNP, PROBNP in the last 168 hours.   DDimer No results for input(s): DDIMER in the last 168 hours.   Radiology    No results found.  Cardiac Studies   Echo 02/10/2018 LV EF: 15% - 20% Study Conclusions  - Left ventricle: The cavity size was moderately dilated. Wall thickness was increased in a pattern of mild LVH. Systolic function was severely reduced. The estimated ejection fraction was in the range of 15% to 20%. Diffuse hypokinesis. - Mitral valve: Mildly calcified annulus. Mildly thickened, mildly calcified leaflets . There was moderate regurgitation. - Left atrium: The atrium was moderately dilated. -  Right ventricle: The cavity size was moderately dilated. Systolic function was moderately reduced. - Tricuspid valve: There was mild-moderate regurgitation. - Pulmonary arteries: Systolic pressure was moderately increased. PA peak pressure: 51 mm Hg (S).  Patient Profile     85 y.o. female with PMH of remote L mastectomy for breast CA, CKD stage III, HTN presented with new atrial flutter with RVR and AMS. Apparently daughter could not reach her for 4 days prior to arrival, so her daughter came from [place] to check on her. She was confused, had hallucination and speak nonsensically, therefore she was sent to the hospital. Echo showed significant LV dysfunction. Brain MRI showed old stroke. She was evaluated by vascular with RLE coldness, but U/S did not show any LE arterial blockage.   Assessment & Plan      1.  Acute combined systolic and diastolic heart failure: LVEF 15 to 20% by echocardiogram on 02/10/2018.  Cardiac catheterization was initially planned but was held off due to significant altered mental status.  She underwent significant IV diuresis and appears dry today and is tachycardic.  IV Lasix was stopped yesterday.  I will hold off on starting oral Lasix as initially planned.  I spoke with the hospitalist.  We will plan to give some gentle IV fluids to see if this improves tachycardia and blood pressure.  Will do so cautiously given severe left ventricular dysfunction.  2.  Rapid atrial flutter: She did have a short episode of atrial fibrillation as well.  Not a candidate for systemic anticoagulation given her frailty and risk of falls.  She has extremely limited mobility and having her on warfarin would be quite problematic as well due to the need for frequent office visits for INR monitoring. This was elucidated by Dr. Acie Fredrickson previously on 1/22 as well. An ECG was ordered yesterday but not performed.  I will reorder. She remains tachycardic with heart rates in the 115-120 bpm range.  She is on maximal dose of Lopressor 100 mg twice daily.  Unable to use calcium channel blockers due to severe left ventricular dysfunction.  Unable to use digoxin due to advanced chronic kidney disease.  She developed nausea with IV amiodarone which was previously discontinued.  As stated above, will provide gentle hydration cautiously as she appears dry which may be contributing to tachycardia.  3.  Acute on chronic renal failure stage III/IV: Creatinine on admission was 1.9.  It is 2.1 today and had been 2.22 yesterday.  See discussion #1 and 2.  4.  Elevated troponin: This was felt due to decompensated heart failure.  Cardiac catheterization deferred.  5.  Hyperkalemia: Potassium 6.2 and was normal yesterday.  Spironolactone previously discontinued.  Spoke with hospitalist.  Will repeat basic metabolic  panel.   For questions or updates, please contact [place] Please consult [email or web address] for contact info under Cardiology/STEMI.      Signed, Kate Sable, MD  02/21/2018, 8:35 AM

## 2018-02-21 NOTE — Progress Notes (Addendum)
Daily Progress Note   Patient Name: Kendra Bray [facility]       Date: 02/21/2018 DOB: [birth year]  Age: 85 y.o. MRN#: [id number] Attending Physician: Cristal Ford, DO Primary Care Physician: Dorena Cookey, MD Admit Date: 02/09/2018  Reason for Consultation/Follow-up: Establishing goals of care  Subjective: Patient is resting in bed. No family at bedside. Patient states her daughter did not tell her she was coming today. Patient states she is feeling better. Discussed nutrition and healing. Her goal is to continue to treat the treatable.   Spoke with daughter later in day. She is worried her mother is declining and would never come out of SNF. She is concerned about her intake. She states her mother will have to see how she does and what her quality of life will be.   Remeet 2:00 Tuesday   Length of Stay: 12  Current Medications: Scheduled Meds:  . allopurinol  100 mg Oral Daily  . heparin injection (subcutaneous)  5,000 Units Subcutaneous Q8H  . metoprolol tartrate  100 mg Oral BID    Continuous Infusions: . sodium chloride      PRN Meds: acetaminophen **OR** acetaminophen, alum & mag hydroxide-simeth, ondansetron **OR** ondansetron (ZOFRAN) IV  Physical Exam Constitutional:      Appearance: She is ill-appearing.     Comments: Resting with eyes closed.   Pulmonary:     Effort: Pulmonary effort is normal.             Vital Signs: BP 95/65 (BP Location: Right Arm)   Pulse (!) 117   Temp (!) 97.5 F (36.4 C) (Axillary)   Resp (!) 22   Ht 5' (1.524 m)   Wt 65 kg   SpO2 98%   BMI 27.99 kg/m  SpO2: SpO2: 98 % O2 Device: O2 Device: Nasal Cannula O2 Flow Rate: O2 Flow Rate (L/min): 2 L/min  Intake/output summary:   Intake/Output Summary (Last 24 hours) at 02/21/2018  0933 Last data filed at 02/21/2018 0433 Gross per 24 hour  Intake 240 ml  Output 750 ml  Net -510 ml   LBM: Last BM Date: 02/19/18 Baseline Weight: Weight: 73.6 kg Most recent weight: Weight: 65 kg       Palliative Assessment/Data:    Flowsheet Rows     Most Recent Value  Intake Tab  Referral Department  Hospitalist  Unit at Time of Referral  Cardiac/Telemetry Unit  Palliative Care Primary Diagnosis  Cardiac  Date Notified  02/15/18  Palliative Care Type  New Palliative care  Reason for referral  Clarify Goals of Care  Date of Admission  02/09/18  Date first seen by Palliative Care  02/16/18  # of days Palliative referral response time  1 Day(s)  # of days IP prior to Palliative referral  6  Clinical Assessment  Psychosocial & Spiritual Assessment  Palliative Care Outcomes      Patient Active Problem List   Diagnosis Date Noted  . Atrial flutter with rapid ventricular response ([place])   . Acute renal failure superimposed on stage 3 chronic kidney disease ([place])   . Hyperkalemia   . Typical atrial flutter ([place])   . Acute on chronic combined systolic and diastolic CHF (congestive heart failure) ([place])   . Atrial fibrillation ([place]) 02/10/2018  . Atrial fibrillation with RVR ([place]) 02/09/2018  . Acute encephalopathy 02/09/2018  . CKD (chronic kidney disease), stage III ([place]) 02/09/2018  . Elevated troponin   . Anemia 02/26/2012  . OSTEOARTHRITIS, KNEE, RIGHT 03/13/2009  . VITAMIN D DEFICIENCY 01/25/2009  . ALLERGIC RHINITIS 07/20/2008  . RENAL FAILURE 02/15/2008  . ARTHRITIS, HX OF 09/15/2006  . OSTEOPOROSIS 09/09/2006  . HYPERTENSION 09/07/2006  . GERD 09/07/2006  . BREAST CANCER, HX OF 09/07/2006    Palliative Care Assessment & Plan    Recommendations/Plan:  Recommend palliative at D/C.     Code Status:    Code Status Orders  (From admission, onward)         Start     Ordered   02/11/18 1252  Do not attempt resuscitation (DNR)  Continuous      Question Answer Comment  In the event of cardiac or respiratory ARREST Do not call a "code blue"   In the event of cardiac or respiratory ARREST Do not perform Intubation, CPR, defibrillation or ACLS   In the event of cardiac or respiratory ARREST Use medication by any route, position, wound care, and other measures to relive pain and suffering. May use oxygen, suction and manual treatment of airway obstruction as needed for comfort.      02/11/18 1251        Code Status History    Date Active Date Inactive Code Status Order ID Comments User Context   02/09/2018 2342 02/11/2018 1251 Full Code [id number]  Rise Patience, MD ED    Advance Directive Documentation     Most Recent Value  Type of Advance Directive  Living will  Pre-existing out of facility DNR order (yellow form or pink MOST form)  -  "MOST" Form in Place?  -       Prognosis:   Unable to determine  Discharge Planning:  To Be Determined    Thank you for allowing the Palliative Medicine Team to assist in the care of this patient.   Total Time 35 min Prolonged Time Billed no      Greater than 50%  of this time was spent counseling and coordinating care related to the above assessment and plan.  Asencion Gowda, NP  Please contact Palliative Medicine Team phone at [phone] for questions and concerns.

## 2018-02-21 NOTE — Plan of Care (Signed)
Monitor Labs. VS routine and prn change in LOC.

## 2018-02-21 NOTE — Progress Notes (Addendum)
PROGRESS NOTE    Kendra Bray [facility]  PQZ:[id number] DOB: 1927/07/09 DOA: 02/09/2018 PCP: Dorena Cookey, MD   Brief Narrative:  HPI on 02/09/2018 by Dr. Gean Birchwood Kendra Bray is a 85 y.o. female with history of chronic kidney disease, breast cancer in remission, gout was brought to the ER after patient was found to be confused.  As per the family who discussed with the ER physician patient has not been able to be reached for the last 4 days and fashion the daughter had arrived from [place] to check on the patient.  Patient was found to be confused and was brought to the ER.  As per the family patient also was having some hallucinations.  Patient did not complain of any headache chest pain or shortness of breath nausea vomiting or diarrhea.  Interim history Patient was admitted with confusion found to have atrial fibrillation with RVR.  She was also found to have a cool right lower extremity and transferred to [facility] for vascular evaluation.  Cardiology was also consulted as patient had systolic heart failure exacerbation. Assessment & Plan   Paroxysmal atrial fibrillation with RVR -Cardiology consulted and appreciated -Patient was started on Cardizem drip initially however this was transitioned to oral metoprolol -Patient noted to have hypotension and bradycardia on oral Cardizem -Discussed at length today with Dr. Acie Fredrickson, cardiology, given her current status, age, limited mobility, patient likely not able to have frequent INR checks in the outpatient setting, renal function will not allow for newer medications. -will hold heparin drip and anticoagulation  -Cardiology started amiodarone drip on 02/17/2018 but discontinued on 02/18/2018  -Currently sinus tachycadia -Continue metoprolol, 100 mg twice daily -Unable to use CCB due to reduced LV systolic function.  Digoxin being avoided given her poor renal function -?physiologic tachycardia- discussed with cardiology, Dr.  Jacinta Shoe, gave patient only 12hr of gentle IVF -She continues to have tachycardia- consider giving more fluids?- will discuss with cardiology  Acute metabolic encephalopathy -Appears to be at baseline today -MRI brain was negative for acute infarct -Vitamin B12 2568 -TSH was slightly elevated however normal T3 and T4 -Chest x-ray and UA unremarkable for infection  Acute systolic heart failure -Echocardiogram showed an EF of 15 to 20% with diffuse hypokinesis. -As above, cardiology consulted and appreciated -Continue to monitor intake and output, daily weights -Was given IV Lasix on 02/14/2018 and made little urine output, she was subsequently placed on bicarb drip.   -Transition to IV Lasix 80 mg twice daily, discontinued on 02/20/2018- lasix holiday per cardiology -Plan was to start oral Lasix 80 mg twice daily today however patient appears to be dry, creatinine slightly improved- feel patient is dry- she was given gentle IVF.  -Cardiac catheterization plan on hold for now -spironolactone discontinued -pending further cardiology recommendations  Mildly elevated troponins -Most likely secondary to demand ischemia  Right lower extremity ischemia -Ruled out -Vascular surgery did not appreciated.  No evidence of occlusive arterial disease on imaging.  No further work-up needed as per vascular surgery  Acute kidney injury on chronic kidney disease, stage III with metabolic acidosis -Baseline creatinine approximately 1.6-2 -Currently on IV Lasix.  Bicarb drip was discontinued -Creatinine currently 2.02 -Continue to monitor BMP  Hypokalemia/Hyperkalemia -despite lokelma, potassium 5.2 (improved) -will give additional dose of lokelma -continue to monitor BMP  Leukocytosis -Resolved -Reactive to the above, continue to monitor CBC  Generalized deconditioning -Overall prognosis is guarded to poor.  Palliative care has been consulted. -Patient currently DNR.  Will continue to treat  the treatable at this time.  On discharge, recommend palliative care to follow  Vomiting -resolved -Patient complains of vomiting, without nausea or abdominal pain -Question whether this is due to amiodarone -Continue maalox  -KUB unremarkable   ?Polycythemia -patient's hemoglobin continues to rise- suspect she is dry from diuresis -mild improvement with IVF, currently Hb 15.5 -continue to monitor   DVT Prophylaxis  Heparin SQ  Code Status: DNR  Family Communication: None at bedside  Disposition Plan: Admitted. Lasix holiday. Pending further recommendations from cardiology  Consultants Cardiology Vascular surgery Palliative care  Procedures  Echocardiogram Lower extremity Doppler, ABI  Antibiotics   Anti-infectives (From admission, onward)   None      Subjective:   [facility] seen and examined today.  NO complaints today. Denies current chest pain, shortness of breath, abdominal pain, N/V/D/C, dizziness, headache.   Objective:   Vitals:   02/20/18 2210 02/21/18 0000 02/21/18 0130 02/21/18 0430  BP: (!) 93/59 94/75 93/65  95/65  Pulse: (!) 119 (!) 118 (!) 119 (!) 117  Resp: 19 18 19  (!) 22  Temp:    (!) 97.5 F (36.4 C)  TempSrc:    Axillary  SpO2: 96% 97% 98% 98%  Weight:    65 kg  Height:        Intake/Output Summary (Last 24 hours) at 02/21/2018 0813 Last data filed at 02/21/2018 0433 Gross per 24 hour  Intake 240 ml  Output 750 ml  Net -510 ml   Filed Weights   02/19/18 0602 02/20/18 0422 02/21/18 0430  Weight: 65.1 kg 64 kg 65 kg   Exam  General: Well developed, chronically ill appearing, NAD  HEENT: NCAT,mucous membranes moist.   Neck: Supple  Cardiovascular: S1 S2 auscultated, regular-tachycardic, no murmur  Respiratory: Clear to auscultation bilaterally with equal chest rise  Abdomen: Soft, nontender, nondistended, + bowel sounds  Extremities: warm dry without cyanosis clubbing or edema  Neuro: AAOx3,  nonfocal  Psych:Depressed however appropriate   Data Reviewed: I have personally reviewed following labs and imaging studies  CBC: Recent Labs  Lab 02/15/18 0357 02/16/18 0746 02/17/18 0412 02/18/18 0451 02/19/18 0444 02/21/18 0355  WBC 14.5* 13.1* 14.1* 13.2*  --  12.2*  HGB 13.1 14.0 14.7 15.7* 16.4* 17.1*  HCT 40.8 42.9 44.3 48.3* 50.7* 53.2*  MCV 99.3 96.6 95.7 97.6  --  99.4  PLT 216 180 186 202  --  973   Basic Metabolic Panel: Recent Labs  Lab 02/15/18 0357 02/16/18 0746 02/17/18 0412 02/18/18 0451 02/19/18 0444 02/19/18 1611 02/20/18 0328 02/21/18 0355  NA 138 139 144 142 143 139 139 135  K 4.1 3.5 3.0* 4.3 5.8* 4.9 5.0 6.2*  CL 112* 103 101 99 98 92* 88* 89*  CO2 12* 23 29 31 30  32 36* 26  GLUCOSE 178* 127* 116* 154* 108* 175* 134* 100*  BUN 79* 72* 61* 55* 63* 67* 71* 92*  CREATININE 2.50* 2.14* 1.78* 1.61* 1.92* 1.99* 2.22* 2.10*  CALCIUM 9.3 9.1 9.6 10.0 10.7* 10.6* 10.9* 10.6*  MG 2.2 1.9 1.7 2.4  --   --   --   --    GFR: Estimated Creatinine Clearance: 15.6 mL/min (A) (by C-G formula based on SCr of 2.1 mg/dL (H)). Liver Function Tests: No results for input(s): AST, ALT, ALKPHOS, BILITOT, PROT, ALBUMIN in the last 168 hours. No results for input(s): LIPASE, AMYLASE in the last 168 hours. No results for input(s): AMMONIA in the last 168 hours.  Coagulation Profile: No results for input(s): INR, PROTIME in the last 168 hours. Cardiac Enzymes: No results for input(s): CKTOTAL, CKMB, CKMBINDEX, TROPONINI in the last 168 hours. BNP (last 3 results) No results for input(s): PROBNP in the last 8760 hours. HbA1C: No results for input(s): HGBA1C in the last 72 hours. CBG: Recent Labs  Lab 02/16/18 1119  GLUCAP 145*   Lipid Profile: No results for input(s): CHOL, HDL, LDLCALC, TRIG, CHOLHDL, LDLDIRECT in the last 72 hours. Thyroid Function Tests: No results for input(s): TSH, T4TOTAL, FREET4, T3FREE, THYROIDAB in the last 72 hours. Anemia  Panel: No results for input(s): VITAMINB12, FOLATE, FERRITIN, TIBC, IRON, RETICCTPCT in the last 72 hours. Urine analysis:    Component Value Date/Time   COLORURINE YELLOW 02/09/2018 2003   APPEARANCEUR CLEAR 02/09/2018 2003   LABSPEC 1.019 02/09/2018 2003   PHURINE 5.0 02/09/2018 2003   [place] 02/09/2018 2003   HGBUR NEGATIVE 02/09/2018 2003   HGBUR negative 02/14/2010 1317   BILIRUBINUR NEGATIVE 02/09/2018 2003   BILIRUBINUR n 02/28/2013 1112   KETONESUR 5 (A) 02/09/2018 2003   PROTEINUR NEGATIVE 02/09/2018 2003   UROBILINOGEN 0.2 02/28/2013 1112   UROBILINOGEN 0.2 02/14/2010 1317   NITRITE NEGATIVE 02/09/2018 2003   LEUKOCYTESUR NEGATIVE 02/09/2018 2003   Sepsis Labs: @LABRCNTIP (procalcitonin:4,lacticidven:4)  ) No results found for this or any previous visit (from the past 240 hour(s)).    Radiology Studies: No results found.   Scheduled Meds: . allopurinol  100 mg Oral Daily  . furosemide  80 mg Oral BID  . heparin injection (subcutaneous)  5,000 Units Subcutaneous Q8H  . metoprolol tartrate  100 mg Oral BID   Continuous Infusions:    LOS: 12 days   Time Spent in minutes   30 minutes    Presley Summerlin D.O. on 02/21/2018 at 8:13 AM  Between 7am to 7pm - Please see pager noted on [email or web address]  After 7pm go to [email or web address]  And look for the night coverage person covering for me after hours  [facility] Office  [phone]

## 2018-02-22 DIAGNOSIS — N170 Acute kidney failure with tubular necrosis: Secondary | ICD-10-CM

## 2018-02-22 LAB — BASIC METABOLIC PANEL
ANION GAP: 12 (ref 5–15)
BUN: 83 mg/dL — ABNORMAL HIGH (ref 8–23)
CO2: 26 mmol/L (ref 22–32)
Calcium: 10.3 mg/dL (ref 8.9–10.3)
Chloride: 97 mmol/L — ABNORMAL LOW (ref 98–111)
Creatinine, Ser: 2.02 mg/dL — ABNORMAL HIGH (ref 0.44–1.00)
GFR calc non Af Amer: 21 mL/min — ABNORMAL LOW (ref >60)
GFR, EST AFRICAN AMERICAN: 25 mL/min — AB (ref >60)
Glucose, Bld: 107 mg/dL — ABNORMAL HIGH (ref 70–99)
Potassium: 5.2 mmol/L — ABNORMAL HIGH (ref 3.5–5.1)
Sodium: 135 mmol/L (ref 135–145)

## 2018-02-22 LAB — CBC
HCT: 48.9 % — ABNORMAL HIGH (ref 36.0–46.0)
HEMOGLOBIN: 15.5 g/dL — AB (ref 12.0–15.0)
MCH: 31.2 pg (ref 26.0–34.0)
MCHC: 31.7 g/dL (ref 30.0–36.0)
MCV: 98.4 fL (ref 80.0–100.0)
Platelets: 221 10*3/uL (ref 150–400)
RBC: 4.97 MIL/uL (ref 3.87–5.11)
RDW: 15.5 % (ref 11.5–15.5)
WBC: 10.1 10*3/uL (ref 4.0–10.5)
nRBC: 0 % (ref 0.0–0.2)

## 2018-02-22 MED ORDER — SODIUM CHLORIDE 0.9 % IV SOLN
INTRAVENOUS | Status: DC
  Administered 2018-02-22: 13:00:00 via INTRAVENOUS

## 2018-02-22 NOTE — Progress Notes (Signed)
Occupational Therapy Treatment Patient Details Name: Kendra Bray MRN: [id number] DOB: 03/18/1927 Today's Date: 02/22/2018    History of present illness Pt is an 85 y.o. female admitted 02/09/18 with confusion. Head CT and MRI negative for acute abnormality. Worked up for CHF, afib with RVR. PMH includes CKD, HTN, stroke, breast CA, obesity.   OT comments  Pt making progress with functional goals. Pt continues to demo cognitive impairments and requires multimodal cues and increased time to initiate and complete functional tasks. OT will continue to follow acutely  Follow Up Recommendations  SNF;Supervision/Assistance - 24 hour    Equipment Recommendations  3 in 1 bedside commode    Recommendations for Other Services      Precautions / Restrictions Precautions Precautions: Fall Restrictions Weight Bearing Restrictions: No       Mobility Bed Mobility Overal bed mobility: Needs Assistance Bed Mobility: Supine to Sit     Supine to sit: Supervision;HOB elevated        Transfers Overall transfer level: Needs assistance Equipment used: Rolling walker (2 wheeled) Transfers: Sit to/from Stand Sit to Stand: Min assist         General transfer comment: cues for correct hand placement    Balance Overall balance assessment: Needs assistance Sitting-balance support: No upper extremity supported;Feet supported Sitting balance-Leahy Scale: Fair     Standing balance support: Single extremity supported;Bilateral upper extremity supported;During functional activity Standing balance-Leahy Scale: Poor                             ADL either performed or assessed with clinical judgement   ADL Overall ADL's : Needs assistance/impaired     Grooming: Wash/dry hands;Wash/dry face;Standing;Min guard               Lower Body Dressing: Moderate assistance   Toilet Transfer: Minimal assistance;RW;Ambulation;BSC   Toileting- Clothing Manipulation and Hygiene:  Minimal assistance;Sit to/from stand       Functional mobility during ADLs: Minimal assistance;Rolling walker;Cueing for safety;Cueing for sequencing General ADL Comments: Pt requires increased time for tasks; pt following commands with multiple verbal cues     Vision Baseline Vision/History: No visual deficits Vision Assessment?: No apparent visual deficits   Perception     Praxis      Cognition Arousal/Alertness: Awake/alert Behavior During Therapy: Flat affect Overall Cognitive Status: Impaired/Different from baseline Area of Impairment: Attention;Memory;Following commands;Safety/judgement;Awareness;Problem solving                       Following Commands: Follows one step commands with increased time Safety/Judgement: Decreased awareness of deficits   Problem Solving: Slow processing;Decreased initiation;Requires verbal cues          Exercises     Shoulder Instructions       General Comments      Pertinent Vitals/ Pain       Pain Assessment: No/denies pain Pain Score: 0-No pain Pain Intervention(s): Monitored during session  Home Living                                          Prior Functioning/Environment              Frequency  Min 2X/week        Progress Toward Goals  OT Goals(current goals can now be found in the care plan section)  Plan Discharge plan remains appropriate    Co-evaluation                 AM-PAC OT "6 Clicks" Daily Activity     Outcome Measure   Help from another person eating meals?: None Help from another person taking care of personal grooming?: A Little Help from another person toileting, which includes using toliet, bedpan, or urinal?: A Little Help from another person bathing (including washing, rinsing, drying)?: A Lot Help from another person to put on and taking off regular upper body clothing?: A Little Help from another person to put on and taking off regular lower  body clothing?: A Lot 6 Click Score: 17    End of Session Equipment Utilized During Treatment: Gait belt;Rolling walker;Other (comment)(BSC)  OT Visit Diagnosis: Unsteadiness on feet (R26.81);Muscle weakness (generalized) (M62.81)   Activity Tolerance Patient tolerated treatment well   Patient Left in chair;with call bell/phone within reach;with chair alarm set   Nurse Communication          Time: [phone] OT Time Calculation (min): 26 min  Charges: OT General Charges $OT Visit: 1 Visit OT Treatments $Self Care/Home Management : 8-22 mins $Therapeutic Activity: 8-22 mins     [name] 02/22/2018, 11:37 AM

## 2018-02-22 NOTE — Progress Notes (Signed)
Progress Note  Patient Name: Kendra Bray Date of Encounter: 02/22/2018  Primary Cardiologist: Sanda Klein, MD   Subjective   She has no complaints today, still on supplemental O2 (none at home). No palpitations or shortness of breath. Wants to discharge.  Inpatient Medications    Scheduled Meds: . allopurinol  100 mg Oral Daily  . heparin injection (subcutaneous)  5,000 Units Subcutaneous Q8H  . metoprolol tartrate  100 mg Oral BID   Continuous Infusions:  PRN Meds: acetaminophen **OR** acetaminophen, alum & mag hydroxide-simeth, ondansetron **OR** ondansetron (ZOFRAN) IV   Vital Signs    Vitals:   02/21/18 2145 02/21/18 2310 02/22/18 0010 02/22/18 0519  BP: 96/65 96/73 94/75  92/78  Pulse: (!) 117 (!) 116 (!) 116 (!) 116  Resp: 17 (!) 24 (!) 23 14  Temp:      TempSrc:      SpO2: 97% 94% 97% 98%  Weight:    65.3 kg  Height:        Intake/Output Summary (Last 24 hours) at 02/22/2018 0806 Last data filed at 02/22/2018 0500 Gross per 24 hour  Intake 720.55 ml  Output 450 ml  Net 270.55 ml   Last 3 Weights 02/22/2018 02/21/2018 02/20/2018  Weight (lbs) 144 lb 143 lb 4.8 oz 141 lb  Weight (kg) 65.318 kg 65 kg 63.957 kg      Telemetry    Sinus tachycardia in the 110s - Personally Reviewed  ECG    No new tracings - Personally Reviewed  Physical Exam   GEN: No acute distress.   Neck: minimal JVD Cardiac: regular rhythm, tachycardic rate, no murmur Respiratory: Clear to auscultation bilaterally in upper lobes, slightly diminished in lower lobes GI: Soft, nontender, non-distended  MS: trace edema; No deformity. Neuro:  Nonfocal  Psych: Normal affect   Labs    Chemistry Recent Labs  Lab 02/21/18 0355 02/21/18 0944 02/22/18 0255  NA 135 136 135  K 6.2* 5.6* 5.2*  CL 89* 88* 97*  CO2 26 35* 26  GLUCOSE 100* 133* 107*  BUN 92* 89* 83*  CREATININE 2.10* 2.29* 2.02*  CALCIUM 10.6* 10.9* 10.3  ALBUMIN  --  3.4*  --   GFRNONAA 20* 18* 21*    GFRAA 24* 21* 25*  ANIONGAP 20* 13 12     Hematology Recent Labs  Lab 02/18/18 0451 02/19/18 0444 02/21/18 0355 02/22/18 0255  WBC 13.2*  --  12.2* 10.1  RBC 4.95  --  5.35* 4.97  HGB 15.7* 16.4* 17.1* 15.5*  HCT 48.3* 50.7* 53.2* 48.9*  MCV 97.6  --  99.4 98.4  MCH 31.7  --  32.0 31.2  MCHC 32.5  --  32.1 31.7  RDW 16.6*  --  15.8* 15.5  PLT 202  --  198 221    Cardiac EnzymesNo results for input(s): TROPONINI in the last 168 hours. No results for input(s): TROPIPOC in the last 168 hours.   BNPNo results for input(s): BNP, PROBNP in the last 168 hours.   DDimer No results for input(s): DDIMER in the last 168 hours.   Radiology    No results found.  Cardiac Studies   Echo 02/10/2018 LV EF: 15% - 20%  Study Conclusions - Left ventricle: The cavity size was moderately dilated. Wall thickness was increased in a pattern of mild LVH. Systolic function was severely reduced. The estimated ejection fraction was in the range of 15% to 20%. Diffuse hypokinesis. - Mitral valve: Mildly calcified annulus. Mildly thickened, mildly  calcified leaflets . There was moderate regurgitation. - Left atrium: The atrium was moderately dilated. - Right ventricle: The cavity size was moderately dilated. Systolic function was moderately reduced. - Tricuspid valve: There was mild-moderate regurgitation. - Pulmonary arteries: Systolic pressure was moderately increased. PA peak pressure: 51 mm Hg (S).  Patient Profile     85 y.o. female with PMH of remote L mastectomy for breast CA, CKD stage III, HTN presented with new atrial flutter with RVR and AMS. Apparently daughter could not reach her for 4 days prior to arrival, so her daughter came from [place] to check on her. She was confused, had hallucination and speak nonsensically, therefore she was sent to the hospital. Echo showed significant LV dysfunction. Brain MRI showed old stroke. She was evaluated by vascular with RLE  coldness, but U/S did not show any LE arterial blockage.  Assessment & Plan    1. Acute combined systolic and diastolic heart failure - recent echo 02/10/18 with EF of 15-20% - she underwent diuresis and thought to be dry over the weekend with evidence of sinus tachycardia yesterday - gentle IV fluid resuscitation - weight is 144 lbs, from 141 lb on 02/20/18 (162 lbs on admission) - palliative is following  2. Rapid atrial flutter, sinus tachycardia - short episode of Aflutter and Afib - not a candidate for anticoagulation given her current status, frailty, and difficulty coming to INR appts - lopressor 100 mg BID continued, avoid CCB (LVEF), avoid digoxin (CKD), nausea with amiodarone - she continues to be tachycardic in the 110s, asymptomatic - continue lopressor and gentle hydration for now  3. Acute on chronic renal failure III/IV - sCr 2.02 (2.29, 2.10) - appears to have stabilized; baseline unclear - urine dark  4. Elevated troponin - likely demand ischemia in the setting of CHF exacerbation - no further workup planned  5. Hyperkalemia - K today 5.2 (5.6, 6.2) - continue to hold spironolactone for now      For questions or updates, please contact [place] Please consult [email or web address] for contact info under        Signed, Ledora Bottcher, PA  02/22/2018, 8:06 AM

## 2018-02-22 NOTE — Social Work (Signed)
Received a call from patient's daughter, [name]. [name] indicated they have a meeting planned with palliative tomorrow at 2 pm. [name] not yet ready to make a decision on SNF, she's concerned patient may not be able to rehab well. She is awaiting discussion with palliative. CSW will continue to follow and support.  Estanislado Emms, LCSW [phone]

## 2018-02-22 NOTE — Progress Notes (Signed)
PROGRESS NOTE    Kendra Bray [facility]  WJX:[id number] DOB: December 09, 1927 DOA: 02/09/2018 PCP: Kendra Cookey, MD   Brief Narrative:  HPI on 02/09/2018 by Dr. Gean Bray Kendra Bray is a 85 y.o. female with history of chronic kidney disease, breast cancer in remission, gout was brought to the ER after patient was found to be confused.  As per the family who discussed with the ER physician patient has not been able to be reached for the last 4 days and fashion the daughter had arrived from [place] to check on the patient.  Patient was found to be confused and was brought to the ER.  As per the family patient also was having some hallucinations.  Patient did not complain of any headache chest pain or shortness of breath nausea vomiting or diarrhea.  Interim history Patient was admitted with confusion found to have atrial fibrillation with RVR.  She was also found to have a cool right lower extremity and transferred to [facility] for vascular evaluation.  Cardiology was also consulted as patient had systolic heart failure exacerbation. Assessment & Plan   Paroxysmal atrial fibrillation with RVR -Cardiology consulted and appreciated -Patient was started on Cardizem drip initially however this was transitioned to oral metoprolol -Patient noted to have hypotension and bradycardia on oral Cardizem -Discussed at length today with Dr. Acie Bray, cardiology, given her current status, age, limited mobility, patient likely not able to have frequent INR checks in the outpatient setting, renal function will not allow for newer medications. -will hold heparin drip and anticoagulation  -Cardiology started amiodarone drip on 02/17/2018 but discontinued on 02/18/2018  -Currently sinus tachycadia -Continue metoprolol, 100 mg twice daily -Unable to use CCB due to reduced LV systolic function.  Digoxin being avoided given her poor renal function -?physiologic tachycardia- discussed with cardiology, Dr.  Jacinta Bray, will start very gentle IVF for 12 hours and monitor   Acute metabolic encephalopathy -Appears to be at baseline today -MRI brain was negative for acute infarct -Vitamin B12 2568 -TSH was slightly elevated however normal T3 and T4 -Chest x-ray and UA unremarkable for infection  Acute systolic heart failure -Echocardiogram showed an EF of 15 to 20% with diffuse hypokinesis. -As above, cardiology consulted and appreciated -Continue to monitor intake and output, daily weights -Was given IV Lasix on 02/14/2018 and made little urine output, she was subsequently placed on bicarb drip.   -Transition to IV Lasix 80 mg twice daily, discontinued on 02/20/2018- lasix holiday per cardiology -Plan was to start oral Lasix 80 mg twice daily today however patient appears to be dry, creatinine slightly improved- feel patient is dry- will start on gentle IVF and monitor closely -Cardiac catheterization plan on hold for now -spironolactone discontinued  Mildly elevated troponins -Most likely secondary to demand ischemia  Right lower extremity ischemia -Ruled out -Vascular surgery did not appreciated.  No evidence of occlusive arterial disease on imaging.  No further work-up needed as per vascular surgery  Acute kidney injury on chronic kidney disease, stage III with metabolic acidosis -Baseline creatinine approximately 1.6-2 -Currently on IV Lasix.  Bicarb drip was discontinued -Creatinine currently 2.1 -Continue to monitor BMP  Hypokalemia/Hyperkalemia -K 6.2 today-? Accuracy -will repeat and if necessary give lokelema -continue to monitor BMP  Leukocytosis -Reactive to the above, continue to monitor CBC  Generalized deconditioning -Overall prognosis is guarded to poor.  Palliative care has been consulted. -Patient currently DNR.  Will continue to treat the treatable at this time.  On discharge,  recommend palliative care to follow  Vomiting -resolved -Patient complains  of vomiting, without nausea or abdominal pain -Question whether this is due to amiodarone -Continue maalox  -KUB unremarkable   ?Polycythemia -patient's hemoglobin continues to rise- suspect she is dry from diuresis -will start gentle IVF and monitor  DVT Prophylaxis  Heparin SQ  Code Status: DNR  Family Communication: None at bedside  Disposition Plan: Admitted. Lasix holiday. Pending further recommendations from cardiology  Consultants Cardiology Vascular surgery Palliative care  Procedures  Echocardiogram Lower extremity Doppler, ABI  Antibiotics   Anti-infectives (From admission, onward)   None      Subjective:   [facility] seen and examined today.  No complaints today. Denies chest pain, shortness of breath, abdominal pain, N/V/D/C, dizziness, headache.   Objective:   Vitals:   02/21/18 2145 02/21/18 2310 02/22/18 0010 02/22/18 0519  BP: 96/65 96/73 94/75  92/78  Pulse: (!) 117 (!) 116 (!) 116 (!) 116  Resp: 17 (!) 24 (!) 23 14  Temp:      TempSrc:      SpO2: 97% 94% 97% 98%  Weight:    65.3 kg  Height:        Intake/Output Summary (Last 24 hours) at 02/22/2018 1128 Last data filed at 02/22/2018 0500 Gross per 24 hour  Intake 720.55 ml  Output 450 ml  Net 270.55 ml   Filed Weights   02/20/18 0422 02/21/18 0430 02/22/18 0519  Weight: 64 kg 65 kg 65.3 kg   Exam  General: Well developed, chronically ill appearing, NAD  HEENT: NCAT, PERRLA, EOMI, Anicteic Sclera, mucous membranes moist.   Neck: Supple, no JVD, no masses  Cardiovascular: S1 S2 auscultated, tachycardia, no murmur  Respiratory: Clear to auscultation bilaterally with equal chest rise  Abdomen: Soft, nontender, nondistended, + bowel sounds  Extremities: warm dry without cyanosis clubbing or edema  Neuro: AAOx3, nonfocal  Psych: Depressed however appropriate   Data Reviewed: I have personally reviewed following labs and imaging studies  CBC: Recent Labs  Lab  02/16/18 0746 02/17/18 0412 02/18/18 0451 02/19/18 0444 02/21/18 0355 02/22/18 0255  WBC 13.1* 14.1* 13.2*  --  12.2* 10.1  HGB 14.0 14.7 15.7* 16.4* 17.1* 15.5*  HCT 42.9 44.3 48.3* 50.7* 53.2* 48.9*  MCV 96.6 95.7 97.6  --  99.4 98.4  PLT 180 186 202  --  198 427   Basic Metabolic Panel: Recent Labs  Lab 02/16/18 0746 02/17/18 0412 02/18/18 0451  02/19/18 1611 02/20/18 0328 02/21/18 0355 02/21/18 0944 02/22/18 0255  NA 139 144 142   < > 139 139 135 136 135  K 3.5 3.0* 4.3   < > 4.9 5.0 6.2* 5.6* 5.2*  CL 103 101 99   < > 92* 88* 89* 88* 97*  CO2 23 29 31    < > 32 36* 26 35* 26  GLUCOSE 127* 116* 154*   < > 175* 134* 100* 133* 107*  BUN 72* 61* 55*   < > 67* 71* 92* 89* 83*  CREATININE 2.14* 1.78* 1.61*   < > 1.99* 2.22* 2.10* 2.29* 2.02*  CALCIUM 9.1 9.6 10.0   < > 10.6* 10.9* 10.6* 10.9* 10.3  MG 1.9 1.7 2.4  --   --   --   --   --   --   PHOS  --   --   --   --   --   --   --  3.4  --    < > =  values in this interval not displayed.   GFR: Estimated Creatinine Clearance: 16.2 mL/min (A) (by C-G formula based on SCr of 2.02 mg/dL (H)). Liver Function Tests: Recent Labs  Lab 02/21/18 0944  ALBUMIN 3.4*   No results for input(s): LIPASE, AMYLASE in the last 168 hours. No results for input(s): AMMONIA in the last 168 hours. Coagulation Profile: No results for input(s): INR, PROTIME in the last 168 hours. Cardiac Enzymes: No results for input(s): CKTOTAL, CKMB, CKMBINDEX, TROPONINI in the last 168 hours. BNP (last 3 results) No results for input(s): PROBNP in the last 8760 hours. HbA1C: No results for input(s): HGBA1C in the last 72 hours. CBG: Recent Labs  Lab 02/16/18 1119  GLUCAP 145*   Lipid Profile: No results for input(s): CHOL, HDL, LDLCALC, TRIG, CHOLHDL, LDLDIRECT in the last 72 hours. Thyroid Function Tests: No results for input(s): TSH, T4TOTAL, FREET4, T3FREE, THYROIDAB in the last 72 hours. Anemia Panel: No results for input(s): VITAMINB12,  FOLATE, FERRITIN, TIBC, IRON, RETICCTPCT in the last 72 hours. Urine analysis:    Component Value Date/Time   COLORURINE YELLOW 02/09/2018 2003   APPEARANCEUR CLEAR 02/09/2018 2003   LABSPEC 1.019 02/09/2018 2003   PHURINE 5.0 02/09/2018 2003   [place] 02/09/2018 2003   HGBUR NEGATIVE 02/09/2018 2003   HGBUR negative 02/14/2010 1317   BILIRUBINUR NEGATIVE 02/09/2018 2003   BILIRUBINUR n 02/28/2013 1112   KETONESUR 5 (A) 02/09/2018 2003   PROTEINUR NEGATIVE 02/09/2018 2003   UROBILINOGEN 0.2 02/28/2013 1112   UROBILINOGEN 0.2 02/14/2010 1317   NITRITE NEGATIVE 02/09/2018 2003   LEUKOCYTESUR NEGATIVE 02/09/2018 2003   Sepsis Labs: @LABRCNTIP (procalcitonin:4,lacticidven:4)  ) No results found for this or any previous visit (from the past 240 hour(s)).    Radiology Studies: No results found.   Scheduled Meds: . allopurinol  100 mg Oral Daily  . heparin injection (subcutaneous)  5,000 Units Subcutaneous Q8H  . metoprolol tartrate  100 mg Oral BID   Continuous Infusions:    LOS: 13 days   Time Spent in minutes   45 minutes (greater than 50% of time spent with patient face to face, as well as reviewing records, discussing with consultant, and formulating a plan)   Cristal Ford D.O. on 02/22/2018 at 11:28 AM  Between 7am to 7pm - Please see pager noted on [email or web address]  After 7pm go to [email or web address]  And look for the night coverage person covering for me after hours  [facility] Office  [phone]

## 2018-02-22 NOTE — Progress Notes (Signed)
Physical Therapy Treatment Patient Details Name: Kendra Bray MRN: [id number] DOB: 09/29/1927 Today's Date: 02/22/2018    History of Present Illness Pt is an 85 y.o. female admitted 02/09/18 with confusion. Head CT and MRI negative for acute abnormality. Worked up for CHF, afib with RVR. PMH includes CKD, HTN, stroke, breast CA, obesity.    PT Comments    Patient reports feeling tired this PM. Requires more assist for all mobility from prior sessions with OT and PT. Mod A for bed mobility and sit to stand transfers with max cues for technique, hand placement and use of momentum.  Requires multimodal cues and repetition to perform tasks. Pt also HOH. Mobility limited mainly by dizziness but also fatigue. HR 117-121 bpm throughout. Supine BP 82/57, sitting BP 90/54, post standing 88/64. Symptomatic with each change in position. Appropriate for SNF. Will follow.   Follow Up Recommendations  SNF;Supervision for mobility/OOB     Equipment Recommendations  Other (comment)(defer)    Recommendations for Other Services       Precautions / Restrictions Precautions Precautions: Fall Restrictions Weight Bearing Restrictions: No    Mobility  Bed Mobility Overal bed mobility: Needs Assistance Bed Mobility: Supine to Sit;Sit to Supine     Supine to sit: Mod assist;HOB elevated Sit to supine: Mod assist;HOB elevated   General bed mobility comments: Cues for sequencing movements to get to EOB; cues to reach for rail, assist with LEs, bottom and trunk to get to EOB. Assist to bring LEs into bed and return to supine.  Transfers Overall transfer level: Needs assistance Equipment used: Rolling walker (2 wheeled) Transfers: Sit to/from Stand Sit to Stand: Mod assist         General transfer comment: ASsist to power to standing with cues for hand placement. Stood from [employer], from chair x1. Difficulty standing each time requiring more assist and tiring out quickly. + dizziness.    Ambulation/Gait Ambulation/Gait assistance: Min assist Gait Distance (Feet): 6 Feet Assistive device: Rolling walker (2 wheeled) Gait Pattern/deviations: Step-to pattern;Trunk flexed;Leaning posteriorly;Shuffle Gait velocity: Decreased   General Gait Details: Slow, guarded and unsteady gait with assist for RW navigation; Fatigues quickly. Dizziness.    Stairs             Wheelchair Mobility    Modified Rankin (Stroke Patients Only)       Balance Overall balance assessment: Needs assistance Sitting-balance support: No upper extremity supported;Feet supported Sitting balance-Leahy Scale: Poor Sitting balance - Comments: Requires UE support sitting EOB due to LOB posteriorly.   Standing balance support: During functional activity;Bilateral upper extremity supported Standing balance-Leahy Scale: Poor Standing balance comment: Requires UE support for static standing balance and external support for dynamic balance.                             Cognition Arousal/Alertness: Awake/alert Behavior During Therapy: Flat affect Overall Cognitive Status: Impaired/Different from baseline Area of Impairment: Attention;Memory;Following commands;Safety/judgement;Problem solving                   Current Attention Level: Sustained Memory: Decreased short-term memory Following Commands: Follows one step commands with increased time(multimodal cues and repetition) Safety/Judgement: Decreased awareness of deficits   Problem Solving: Slow processing;Decreased initiation;Requires verbal cues General Comments: ABle to state January and 2020; soft mumbling speech. Requires repetition to perform tasks and multi modal cues.       Exercises      General Comments  General comments (skin integrity, edema, etc.): Niece present during session. HR 117-121 bpm. BP soft. Supine BP 82/57, sitting BP 90/54, post standing 88/64. SYmptomatic with each change in position.       Pertinent Vitals/Pain Pain Assessment: Faces Pain Score: 0-No pain Faces Pain Scale: No hurt Pain Intervention(s): Monitored during session    Home Living                      Prior Function            PT Goals (current goals can now be found in the care plan section) Progress towards PT goals: Not progressing toward goals - comment(likely due to fatigue)    Frequency    Min 2X/week      PT Plan Current plan remains appropriate    Co-evaluation              AM-PAC PT "6 Clicks" Mobility   Outcome Measure  Help needed turning from your back to your side while in a flat bed without using bedrails?: A Little Help needed moving from lying on your back to sitting on the side of a flat bed without using bedrails?: A Lot Help needed moving to and from a bed to a chair (including a wheelchair)?: A Lot Help needed standing up from a chair using your arms (e.g., wheelchair or bedside chair)?: A Lot Help needed to walk in hospital room?: A Lot Help needed climbing 3-5 steps with a railing? : Total 6 Click Score: 12    End of Session Equipment Utilized During Treatment: Gait belt Activity Tolerance: Patient limited by fatigue Patient left: in bed;with call bell/phone within reach;with bed alarm set;with family/visitor present Nurse Communication: Mobility status PT Visit Diagnosis: Other abnormalities of gait and mobility (R26.89);Muscle weakness (generalized) (M62.81)     Time: 8280-0349 PT Time Calculation (min) (ACUTE ONLY): 34 min  Charges:  $Gait Training: 8-22 mins $Therapeutic Activity: 8-22 mins                     Wray Kearns, [place], DPT Acute Rehabilitation Services Pager [phone] Office [phone]       [place] 02/22/2018, 2:15 PM

## 2018-02-23 LAB — BASIC METABOLIC PANEL
Anion gap: 9 (ref 5–15)
BUN: 70 mg/dL — ABNORMAL HIGH (ref 8–23)
CO2: 28 mmol/L (ref 22–32)
Calcium: 9.7 mg/dL (ref 8.9–10.3)
Chloride: 100 mmol/L (ref 98–111)
Creatinine, Ser: 1.75 mg/dL — ABNORMAL HIGH (ref 0.44–1.00)
GFR calc Af Amer: 30 mL/min — ABNORMAL LOW (ref >60)
GFR calc non Af Amer: 26 mL/min — ABNORMAL LOW (ref >60)
Glucose, Bld: 98 mg/dL (ref 70–99)
Potassium: 4.8 mmol/L (ref 3.5–5.1)
Sodium: 137 mmol/L (ref 135–145)

## 2018-02-23 MED ORDER — LORAZEPAM 2 MG/ML IJ SOLN: 0.5000 mg | INTRAMUSCULAR | Status: DC | PRN

## 2018-02-23 MED ORDER — MORPHINE SULFATE (PF) 2 MG/ML IV SOLN: 1.0000 mg | INTRAVENOUS | Status: DC | PRN

## 2018-02-23 MED ORDER — GLYCOPYRROLATE 0.2 MG/ML IJ SOLN: 0.2000 mg | INTRAMUSCULAR | Status: DC | PRN

## 2018-02-23 NOTE — Progress Notes (Addendum)
Daily Progress Note   Patient Name: Kendra Bray [facility]       Date: 02/23/2018 DOB: [birth year]  Age: 85 y.o. MRN#: [id number] Attending Physician: Cristal Ford, DO Primary Care Physician: Dorena Cookey, MD Admit Date: 02/09/2018  Reason for Consultation/Follow-up: Establishing goals of care  Subjective: Patient is resting in bed. Daughter and son in law at bedside. Her appetite has been very poor with sips and bites. She states she realizes she has not been eating enough but has been trying. She has IV fluids in place at this time 2/2 BUN and creatinine elevation.    We discussed her diagnosis, prognosis, GOC, EOL wishes disposition and options.  We discussed her limited prognosis given her oral intake. She states she is saved and knows where she is going, but she does not want to die. She states she still has someone to minister to. She later states she wants to see someone saved before leaving this earth. She states if they were saved she would be ready to go. Ultimately she understands she is not in control of this, and states she will be ok if she can just minister to them. Upon asking if we could help facilitating getting this person here for her to speak to, she stated it was the son in law at the end of the bed. She spoke to him and then stated she did not want to think or plan any longer, she is ready to go. She states she knew she did not have a lot of time.   A detailed discussion was had today regarding advanced directives.  Concepts specific to code status, artifical feeding and hydration, IV antibiotics and rehospitalization were discussed.  The difference between an aggressive medical intervention path and a comfort care path was discussed.  Values and goals of care important to  patient and family were attempted to be elicited.  She would like to be made comfort care and transferred to the hospice home.   I completed a MOST form today and the signed original was placed in the chart. A photocopy was placed in the chart to be scanned into EMR. The patient outlined their wishes for the following treatment decisions:  Cardiopulmonary Resuscitation: Do Not Attempt Resuscitation (DNR/No CPR)  Medical Interventions: Comfort Measures: Keep clean, warm, and dry. Use medication  by any route, positioning, wound care, and other measures to relieve pain and suffering. Use oxygen, suction and manual treatment of airway obstruction as needed for comfort. Do not transfer to the hospital unless comfort needs cannot be met in current location.  Antibiotics: No antibiotics (use other measures to relieve symptoms)  IV Fluids: No IV fluids (provide other measures to ensure comfort)  Feeding Tube: No feeding tube    Morphine 1 mg q 2 PRN for pain or dyspnea Ativan 0.5 mg q 4 PRN for anxiety Robinul 0.2 mg q 4 PRN for excessive secretions.  Length of Stay: 14  Current Medications: Scheduled Meds:  . allopurinol  100 mg Oral Daily  . heparin injection (subcutaneous)  5,000 Units Subcutaneous Q8H  . metoprolol tartrate  100 mg Oral BID    Continuous Infusions: . sodium chloride 50 mL/hr at 02/23/18 1500    PRN Meds: acetaminophen **OR** acetaminophen, alum & mag hydroxide-simeth, ondansetron **OR** ondansetron (ZOFRAN) IV  Physical Exam Constitutional:      Appearance: She is ill-appearing.  Pulmonary:     Effort: Pulmonary effort is normal.  Skin:    General: Skin is warm and dry.  Neurological:     Mental Status: She is alert.             Vital Signs: BP 93/73   Pulse (!) 116   Temp 98.2 F (36.8 C) (Oral)   Resp 20   Ht 5' (1.524 m)   Wt 65.7 kg   SpO2 94%   BMI 28.29 kg/m  SpO2: SpO2: 94 % O2 Device: O2 Device: Nasal Cannula O2 Flow Rate: O2 Flow Rate  (L/min): 2 L/min  Intake/output summary:   Intake/Output Summary (Last 24 hours) at 02/23/2018 1515 Last data filed at 02/23/2018 1500 Gross per 24 hour  Intake 1526.6 ml  Output 300 ml  Net 1226.6 ml   LBM: Last BM Date: (unknown) Baseline Weight: Weight: 73.6 kg Most recent weight: Weight: 65.7 kg       Palliative Assessment/Data: 30%    Flowsheet Rows     Most Recent Value  Intake Tab  Referral Department  Hospitalist  Unit at Time of Referral  Cardiac/Telemetry Unit  Palliative Care Primary Diagnosis  Cardiac  Date Notified  02/15/18  Palliative Care Type  New Palliative care  Reason for referral  Clarify Goals of Care  Date of Admission  02/09/18  Date first seen by Palliative Care  02/16/18  # of days Palliative referral response time  1 Day(s)  # of days IP prior to Palliative referral  6  Clinical Assessment  Psychosocial & Spiritual Assessment  Palliative Care Outcomes      Patient Active Problem List   Diagnosis Date Noted  . Atrial flutter with rapid ventricular response ([place])   . Acute renal failure superimposed on stage 3 chronic kidney disease ([place])   . Hyperkalemia   . Typical atrial flutter ([place])   . Acute on chronic combined systolic and diastolic CHF (congestive heart failure) ([place])   . Atrial fibrillation ([place]) 02/10/2018  . Atrial fibrillation with RVR ([place]) 02/09/2018  . Acute encephalopathy 02/09/2018  . CKD (chronic kidney disease), stage III ([place]) 02/09/2018  . Elevated troponin   . Anemia 02/26/2012  . OSTEOARTHRITIS, KNEE, RIGHT 03/13/2009  . VITAMIN D DEFICIENCY 01/25/2009  . ALLERGIC RHINITIS 07/20/2008  . RENAL FAILURE 02/15/2008  . ARTHRITIS, HX OF 09/15/2006  . OSTEOPOROSIS 09/09/2006  . HYPERTENSION 09/07/2006  . GERD 09/07/2006  .  BREAST CANCER, HX OF 09/07/2006    Palliative Care Assessment & Plan    Recommendations/Plan:  Recommend hospice home transfer.     Code Status:    Code Status Orders  (From  admission, onward)         Start     Ordered   02/11/18 1252  Do not attempt resuscitation (DNR)  Continuous    Question Answer Comment  In the event of cardiac or respiratory ARREST Do not call a "code blue"   In the event of cardiac or respiratory ARREST Do not perform Intubation, CPR, defibrillation or ACLS   In the event of cardiac or respiratory ARREST Use medication by any route, position, wound care, and other measures to relive pain and suffering. May use oxygen, suction and manual treatment of airway obstruction as needed for comfort.      02/11/18 1251        Code Status History    Date Active Date Inactive Code Status Order ID Comments User Context   02/09/2018 2342 02/11/2018 1251 Full Code [id number]  Rise Patience, MD ED    Advance Directive Documentation     Most Recent Value  Type of Advance Directive  Living will  Pre-existing out of facility DNR order (yellow form or pink MOST form)  -  "MOST" Form in Place?  -       Prognosis:  < 2 weeks. Bites and sips. Metabolic derangement. BUN and creatinine elevated. Somewhat improved with IV fluids. CHF EF - 15-20%  Discharge Planning:  Hospice home    Thank you for allowing the Palliative Medicine Team to assist in the care of this patient.   Total Time 2:05- 3:20  1 hour 15 min Prolonged Time Billed yes  :    Greater than 50%  of this time was spent counseling and coordinating care related to the above assessment and plan.  Asencion Gowda, NP  Please contact Palliative Medicine Team phone at [phone] for questions and concerns.

## 2018-02-23 NOTE — Care Management Important Message (Signed)
Important Message  Patient Details  Name: Kendra Bray MRN: [id number] Date of Birth: August 08, 1927   Medicare Important Message Given:  Yes    Tommy Medal 02/23/2018, 11:04 AM

## 2018-02-23 NOTE — Progress Notes (Addendum)
Progress Note  Patient Name: Kendra Bray Date of Encounter: 02/23/2018  Primary Cardiologist: Sanda Klein, MD   Subjective   Denies any chest pain or shortness of breath.  Remains tachycardic at 115 bpm  Inpatient Medications    Scheduled Meds: . allopurinol  100 mg Oral Daily  . heparin injection (subcutaneous)  5,000 Units Subcutaneous Q8H  . metoprolol tartrate  100 mg Oral BID   Continuous Infusions: . sodium chloride 50 mL/hr at 02/23/18 0553   PRN Meds: acetaminophen **OR** acetaminophen, alum & mag hydroxide-simeth, ondansetron **OR** ondansetron (ZOFRAN) IV   Vital Signs    Vitals:   02/22/18 1343 02/22/18 1943 02/22/18 2152 02/23/18 0300  BP: (!) 90/54 93/74 99/76  97/68  Pulse: (!) 116 (!) 107 (!) 117 (!) 116  Resp: 13 (!) 21  20  Temp:  97.8 F (36.6 C)  98.2 F (36.8 C)  TempSrc:  Oral  Oral  SpO2: 98% 95%  94%  Weight:    65.7 kg  Height:        Intake/Output Summary (Last 24 hours) at 02/23/2018 0835 Last data filed at 02/23/2018 0553 Gross per 24 hour  Intake 1672.29 ml  Output 1000 ml  Net 672.29 ml   Filed Weights   02/21/18 0430 02/22/18 0519 02/23/18 0300  Weight: 65 kg 65.3 kg 65.7 kg    Telemetry    Sinus tachycardia at 116bpm - Personally Reviewed  ECG    No new EKG to review - Personally Reviewed  Physical Exam   GEN: No acute distress.   Neck: No JVD Cardiac:  Regular but tachycardic, no murmurs, rubs, or gallops.  Respiratory: Clear to auscultation bilaterally. GI: Soft, nontender, non-distended  MS: No edema; No deformity. Neuro:  Nonfocal  Psych: Normal affect   Labs    Chemistry Recent Labs  Lab 02/21/18 0944 02/22/18 0255 02/23/18 0703  NA 136 135 137  K 5.6* 5.2* 4.8  CL 88* 97* 100  CO2 35* 26 28  GLUCOSE 133* 107* 98  BUN 89* 83* 70*  CREATININE 2.29* 2.02* 1.75*  CALCIUM 10.9* 10.3 9.7  ALBUMIN 3.4*  --   --   GFRNONAA 18* 21* 26*  GFRAA 21* 25* 30*  ANIONGAP 13 12 9       Hematology Recent Labs  Lab 02/18/18 0451 02/19/18 0444 02/21/18 0355 02/22/18 0255  WBC 13.2*  --  12.2* 10.1  RBC 4.95  --  5.35* 4.97  HGB 15.7* 16.4* 17.1* 15.5*  HCT 48.3* 50.7* 53.2* 48.9*  MCV 97.6  --  99.4 98.4  MCH 31.7  --  32.0 31.2  MCHC 32.5  --  32.1 31.7  RDW 16.6*  --  15.8* 15.5  PLT 202  --  198 221    Cardiac EnzymesNo results for input(s): TROPONINI in the last 168 hours. No results for input(s): TROPIPOC in the last 168 hours.   BNPNo results for input(s): BNP, PROBNP in the last 168 hours.   DDimer No results for input(s): DDIMER in the last 168 hours.   Radiology    No results found.  Cardiac Studies   Echo 02/10/2018 LV EF: 15% - 20%  Study Conclusions - Left ventricle: The cavity size was moderately dilated. Wall thickness was increased in a pattern of mild LVH. Systolic function was severely reduced. The estimated ejection fraction was in the range of 15% to 20%. Diffuse hypokinesis. - Mitral valve: Mildly calcified annulus. Mildly thickened, mildly calcified leaflets . There was moderate  regurgitation. - Left atrium: The atrium was moderately dilated. - Right ventricle: The cavity size was moderately dilated. Systolic function was moderately reduced. - Tricuspid valve: There was mild-moderate regurgitation. - Pulmonary arteries: Systolic pressure was moderately increased. PA peak pressure: 51 mm Hg (S).  Patient Profile     85 y.o. female with PMH of remote L mastectomy for breast CA, CKD stage III, HTN presented with new atrial flutter with RVR and AMS. Apparently daughter could not reach her for 4 days prior to arrival, so her daughter came from [place] to check on her. She was confused, had hallucination and speak nonsensically, therefore she was sent to the hospital. Echo showed significant LV dysfunction. Brain MRI showed old stroke. She was evaluated by vascular with RLE coldness, but U/S did not show any LE  arterial blockage.   Assessment & Plan    1. Acute combined systolic and diastolic heart failure - recent echo 02/10/18 with EF of 15-20% - she underwent diuresis and thought to be dry over the weekend with evidence of sinus tachycardia yesterday -Currently getting gentle IV fluid resuscitation after overdiuresis -She put out 1 L yesterday and is net -3.4 L -weight remains 144 lbs, from 141 lb on 02/20/18 (162 lbs on admission) -palliative is following -Cannot use ARB/ACE I/ARN I due to CKD. -BP too soft for hydralazine/Imdur -Continue beta-blocker  2. Rapid atrial flutter, sinus tachycardia - short episode of Aflutter and Afib - not a candidate for anticoagulation given her current status, frailty, and difficulty coming to INR appts -Continue Lopressor 100 mg twice daily. -Avoid CCB due to LV dysfunction and digoxin due to advanced age and chronic kidney disease -Still puzzling that she continues to be tachycardic in the 115bpm range.  This is despite continued IV hydration and beta-blockers in large doses.  Going to check a 12-lead EKG to make sure she is not in slow atrial flutter - continue lopressor and gentle hydration for now  3. Acute on chronic renal failure III/IV - sCr improved to 1.75 today (2.29, 2. 02, 1.75) - appears to have stabilized; baseline unclear -Improving with gentle hydration  4. Elevated troponin - likely demand ischemia in the setting of CHF exacerbation - no further workup planned  5. Hyperkalemia - K today 4.8  - continue to hold spironolactone for now  Overall she is very debilitated and frail.  Palliative care is following.      For questions or updates, please contact [place] Please consult [email or web address] for contact info under Cardiology/STEMI.      Signed, Fransico Him, MD  02/23/2018, 8:35 AM

## 2018-02-23 NOTE — Progress Notes (Signed)
Hospice and [facility])  Received request for residential hospice at [facility] from [place].    Spoke with daughter [name] to acknowledge referral and answer any questions related to hospice and [facility].    Unfortunately [facility] is not able to offer a room today.  Family and CSW are aware [facility] liaison will follow up with CSW and family tomorrow or sooner if room becomes available.    Please do not hesitate to call with questions.  Thank you, Venia Carbon BSN, [facility] Liaison (listed in [place]) [phone]

## 2018-02-23 NOTE — Progress Notes (Signed)
PROGRESS NOTE    Kendra Bray [facility]  SPQ:[id number] DOB: August 07, 1927 DOA: 02/09/2018 PCP: Dorena Cookey, MD   Brief Narrative:  HPI on 02/09/2018 by Dr. Gean Birchwood Virgel Kendra Bray is a 85 y.o. female with history of chronic kidney disease, breast cancer in remission, gout was brought to the ER after patient was found to be confused.  As per the family who discussed with the ER physician patient has not been able to be reached for the last 4 days and fashion the daughter had arrived from [place] to check on the patient.  Patient was found to be confused and was brought to the ER.  As per the family patient also was having some hallucinations.  Patient did not complain of any headache chest pain or shortness of breath nausea vomiting or diarrhea.  Interim history Patient was admitted with confusion found to have atrial fibrillation with RVR.  She was also found to have a cool right lower extremity and transferred to [facility] for vascular evaluation.  Cardiology was also consulted as patient had systolic heart failure exacerbation. Assessment & Plan   Paroxysmal atrial fibrillation with RVR -Cardiology consulted and appreciated -Patient was started on Cardizem drip initially however this was transitioned to oral metoprolol -Patient noted to have hypotension and bradycardia on oral Cardizem -Discussed at length today with Dr. Acie Fredrickson, cardiology, given her current status, age, limited mobility, patient likely not able to have frequent INR checks in the outpatient setting, renal function will not allow for newer medications. -will hold heparin drip and anticoagulation  -Cardiology started amiodarone drip on 02/17/2018 but discontinued on 02/18/2018  -Currently sinus tachycadia -Continue metoprolol, 100 mg twice daily -Unable to use CCB due to reduced LV systolic function.  Digoxin being avoided given her poor renal function -?physiologic tachycardia- discussed with cardiology, currently on  gentle IVF  Acute metabolic encephalopathy -Appears to be at baseline today -MRI brain was negative for acute infarct -Vitamin B12 2568 -TSH was slightly elevated however normal T3 and T4 -Chest x-ray and UA unremarkable for infection  Acute systolic heart failure -Echocardiogram showed an EF of 15 to 20% with diffuse hypokinesis. -As above, cardiology consulted and appreciated -Continue to monitor intake and output, daily weights -Was given IV Lasix on 02/14/2018 and made little urine output, she was subsequently placed on bicarb drip.   -Transition to IV Lasix 80 mg twice daily, discontinued on 02/20/2018- lasix holiday per cardiology -Plan was to start oral Lasix 80 mg twice daily today however patient appears to be dry, creatinine slightly improved- feel patient is dry- she was given gentle IVF.  -Cardiac catheterization plan on hold for now -spironolactone discontinued -pending further cardiology recommendations  Mildly elevated troponins -Most likely secondary to demand ischemia  Right lower extremity ischemia -Ruled out -Vascular surgery did not appreciated.  No evidence of occlusive arterial disease on imaging.  No further work-up needed as per vascular surgery  Acute kidney injury on chronic kidney disease, stage III with metabolic acidosis -Baseline creatinine approximately 1.6-2; peaked to 2.29 -Currently on IV Lasix.  Bicarb drip was discontinued -Creatinine currently 1.75 -Continue to monitor BMP  Hypokalemia/Hyperkalemia -resolved, continue to monitor BMP  Leukocytosis -Resolved -Reactive to the above, continue to monitor CBC  Generalized deconditioning -Overall prognosis is guarded to poor.  Palliative care has been consulted. -Patient currently DNR.  Will continue to treat the treatable at this time.  On discharge, recommend palliative care to follow  Vomiting -resolved -Patient complains of vomiting, without  nausea or abdominal pain -Question whether this  is due to amiodarone -Continue maalox  -KUB unremarkable   ?Polycythemia -patient's hemoglobin continues to rise- suspect she is dry from diuresis -mild improvement with IVF, currently Hb 15.5 -continue to monitor   DVT Prophylaxis  Heparin SQ  Code Status: DNR  Family Communication: None at bedside  Disposition Plan: Admitted. Lasix holiday. Pending further recommendations from cardiology  Consultants Cardiology Vascular surgery Palliative care  Procedures  Echocardiogram Lower extremity Doppler, ABI  Antibiotics   Anti-infectives (From admission, onward)   None      Subjective:   [facility] seen and examined today.  She has no complaints this morning.  Denies current chest pain, shortness breath, abdominal pain, nausea or vomiting, diarrhea or constipation, dizziness or headache.  Objective:   Vitals:   02/22/18 1943 02/22/18 2152 02/23/18 0300 02/23/18 0932  BP: 93/74 99/76 97/68  93/73  Pulse: (!) 107 (!) 117 (!) 116 (!) 116  Resp: (!) 21  20   Temp: 97.8 F (36.6 C)  98.2 F (36.8 C)   TempSrc: Oral  Oral   SpO2: 95%  94%   Weight:   65.7 kg   Height:        Intake/Output Summary (Last 24 hours) at 02/23/2018 1345 Last data filed at 02/23/2018 0553 Gross per 24 hour  Intake 1192.29 ml  Output 700 ml  Net 492.29 ml   Filed Weights   02/21/18 0430 02/22/18 0519 02/23/18 0300  Weight: 65 kg 65.3 kg 65.7 kg   Exam  General: Well developed, chronically ill appearing, NAD  HEENT: NCAT, mucous membranes moist.   Neck: Supple  Cardiovascular: S1 S2 auscultated, regular- tachycardic, no murmur  Respiratory: Clear to auscultation bilaterally   Abdomen: Soft, nontender, nondistended, + bowel sounds  Extremities: warm dry without cyanosis clubbing or edema  Neuro: AAOx3, nonfocal  Psych: Appropriate mood and affect   Data Reviewed: I have personally reviewed following labs and imaging studies  CBC: Recent Labs  Lab 02/17/18 0412  02/18/18 0451 02/19/18 0444 02/21/18 0355 02/22/18 0255  WBC 14.1* 13.2*  --  12.2* 10.1  HGB 14.7 15.7* 16.4* 17.1* 15.5*  HCT 44.3 48.3* 50.7* 53.2* 48.9*  MCV 95.7 97.6  --  99.4 98.4  PLT 186 202  --  198 742   Basic Metabolic Panel: Recent Labs  Lab 02/17/18 0412 02/18/18 0451  02/20/18 0328 02/21/18 0355 02/21/18 0944 02/22/18 0255 02/23/18 0703  NA 144 142   < > 139 135 136 135 137  K 3.0* 4.3   < > 5.0 6.2* 5.6* 5.2* 4.8  CL 101 99   < > 88* 89* 88* 97* 100  CO2 29 31   < > 36* 26 35* 26 28  GLUCOSE 116* 154*   < > 134* 100* 133* 107* 98  BUN 61* 55*   < > 71* 92* 89* 83* 70*  CREATININE 1.78* 1.61*   < > 2.22* 2.10* 2.29* 2.02* 1.75*  CALCIUM 9.6 10.0   < > 10.9* 10.6* 10.9* 10.3 9.7  MG 1.7 2.4  --   --   --   --   --   --   PHOS  --   --   --   --   --  3.4  --   --    < > = values in this interval not displayed.   GFR: Estimated Creatinine Clearance: 18.8 mL/min (A) (by C-G formula based on SCr of 1.75 mg/dL (  H)). Liver Function Tests: Recent Labs  Lab 02/21/18 0944  ALBUMIN 3.4*   No results for input(s): LIPASE, AMYLASE in the last 168 hours. No results for input(s): AMMONIA in the last 168 hours. Coagulation Profile: No results for input(s): INR, PROTIME in the last 168 hours. Cardiac Enzymes: No results for input(s): CKTOTAL, CKMB, CKMBINDEX, TROPONINI in the last 168 hours. BNP (last 3 results) No results for input(s): PROBNP in the last 8760 hours. HbA1C: No results for input(s): HGBA1C in the last 72 hours. CBG: No results for input(s): GLUCAP in the last 168 hours. Lipid Profile: No results for input(s): CHOL, HDL, LDLCALC, TRIG, CHOLHDL, LDLDIRECT in the last 72 hours. Thyroid Function Tests: No results for input(s): TSH, T4TOTAL, FREET4, T3FREE, THYROIDAB in the last 72 hours. Anemia Panel: No results for input(s): VITAMINB12, FOLATE, FERRITIN, TIBC, IRON, RETICCTPCT in the last 72 hours. Urine analysis:    Component Value Date/Time     COLORURINE YELLOW 02/09/2018 2003   APPEARANCEUR CLEAR 02/09/2018 2003   LABSPEC 1.019 02/09/2018 2003   PHURINE 5.0 02/09/2018 2003   [place] 02/09/2018 2003   HGBUR NEGATIVE 02/09/2018 2003   HGBUR negative 02/14/2010 1317   BILIRUBINUR NEGATIVE 02/09/2018 2003   BILIRUBINUR n 02/28/2013 1112   KETONESUR 5 (A) 02/09/2018 2003   PROTEINUR NEGATIVE 02/09/2018 2003   UROBILINOGEN 0.2 02/28/2013 1112   UROBILINOGEN 0.2 02/14/2010 1317   NITRITE NEGATIVE 02/09/2018 2003   LEUKOCYTESUR NEGATIVE 02/09/2018 2003   Sepsis Labs: @LABRCNTIP (procalcitonin:4,lacticidven:4)  ) No results found for this or any previous visit (from the past 240 hour(s)).    Radiology Studies: No results found.   Scheduled Meds: . allopurinol  100 mg Oral Daily  . heparin injection (subcutaneous)  5,000 Units Subcutaneous Q8H  . metoprolol tartrate  100 mg Oral BID   Continuous Infusions: . sodium chloride 50 mL/hr at 02/23/18 0553     LOS: 14 days   Time Spent in minutes   30 minutes   Nadine Ryle D.O. on 02/23/2018 at 1:45 PM  Between 7am to 7pm - Please see pager noted on [email or web address]  After 7pm go to [email or web address]  And look for the night coverage person covering for me after hours  [facility] Office  [phone]

## 2018-02-24 MED ORDER — ALLOPURINOL 100 MG PO TABS: 100.0000 mg | ORAL_TABLET | Freq: Every day | ORAL | 0 refills | Status: AC

## 2018-02-24 MED ORDER — METOPROLOL TARTRATE 100 MG PO TABS: 100.0000 mg | ORAL_TABLET | Freq: Two times a day (BID) | ORAL | 0 refills | Status: AC

## 2018-02-24 MED ORDER — FUROSEMIDE 40 MG PO TABS: 80.0000 mg | ORAL_TABLET | Freq: Every day | ORAL | 0 refills | Status: AC

## 2018-02-24 NOTE — Discharge Summary (Signed)
Physician Discharge Summary  [place] IWL:[id number] DOB: Aug 10, 1927 DOA: 02/09/2018  PCP: Dorena Cookey, MD  Admit date: 02/09/2018 Discharge date: 02/24/2018  Admitted From: Home  Disposition:  Hospice   Recommendations for Outpatient Follow-up and new medication changes:  1. Follow up with Dr. Sherren Mocha in 7 days.  2. Patient placed on metoprolol for rate control 3. Not candidate for full anticoagulation due to high bleeding risk.   Home Health: na   Equipment/Devices: no    Discharge Condition: stable  CODE STATUS: dnr   Diet recommendation: heart healthy   Brief/Interim Summary: 85 year old female who presented with confusion and hallucinations.  She does have history of chronic kidney disease stage 2, breast cancer, and gout.  Patient was found by her family confused and disoriented, positive hallucinations, the she was brought to the hospital for evaluation.  In the initial physical examination she was found in atrial fibrillation with rapid ventricular response, she was confused and disoriented, her blood pressure was 110/70 with a respiratory rate of 29, her lungs were clear to auscultation bilaterally, heart S1-S2 present, irregularly irregular, soft abdomen, no lower extremity edema.  NA 140, potassium 4.3, chloride 108, bicarb 19, glucose 147, BUN 54, creatinine 2.0.  Troponin 0.59, EKG had atrial fibrillation, 176 bpm, left axis deviation, left anterior fascicular block, intraventricular conduction delay, poor R wave progression.  Patient was admitted to the hospital with a working diagnosis of new onset atrial fibrillation with rapid ventricular response.  1.  New onset atrial (paroxysmal)fibrillation with rapid ventricular response.  Patient was admitted to the telemetry unit, she was placed on AV blockade with diltiazem drip and added oral metoprolol.  She did received intravenous amiodarone for difficult to control tachycardia for 24 hours, she converted to sinus rhythm,  but remained tachycardic, metoprolol was adjusted 200 mg twice daily.  Further echocardiography showed a depressed LV systolic function down to 15 to 20%.  Diffuse hypokinesis.  Patient was found not to be a good candidate for full anticoagulation.  2.  Acute systolic heart failure exacerbation.  Patient received IV diuresis with furosemide, worsening kidney function was noted.  Diuretics were adjusted, patient will take furosemide 80 mg daily, to keep negative fluid balance.  Due to patient's poor prognosis no ischemic work-up was pursued.  3.  Acute kidney injury chronic disease stage III with metabolic acidosis/with hypokalemia/hyperkalemia.  Patient required bicarb infusion for metabolic acidosis, diuretic therapy was adjusted.  Electrolytes were corrected.  4.  Metabolic encephalopathy.  Patient had neurochecks while hospitalized, at her baseline at discharge.  5.  Multifactorial polycythemia leukocytosis.  No signs of systemic infection.  Patient had multiple medical problems, advanced age and deconditioning, palliative care service was consulted, and decision was made to proceed with palliative care services at hospice.  Discharge Diagnoses:  Principal Problem:   Atrial fibrillation with RVR (HCC) Active Problems:   BREAST CANCER, HX OF   Acute encephalopathy   CKD (chronic kidney disease), stage III (HCC)   Atrial fibrillation (HCC)   Typical atrial flutter (HCC)   Acute on chronic combined systolic and diastolic CHF (congestive heart failure) (HCC)   Atrial flutter with rapid ventricular response (HCC)   Acute renal failure superimposed on stage 3 chronic kidney disease (HCC)   Hyperkalemia    Discharge Instructions  Discharge Instructions    Diet - low sodium heart healthy   Complete by:  As directed    Discharge instructions   Complete by:  As directed  Please follow with hospice team.   Increase activity slowly   Complete by:  As directed      Allergies as of  02/24/2018      Reactions   Prednisone Other (See Comments)   hallucinations      Medication List    TAKE these medications   allopurinol 100 MG tablet Commonly known as:  ZYLOPRIM Take 1 tablet (100 mg total) by mouth daily for 30 days. Start taking on:  February 25, 2018 What changed:  how much to take   aspirin 325 MG tablet Take 325 mg by mouth daily.   cholecalciferol 1000 units tablet Commonly known as:  VITAMIN D Take 2,000 Units by mouth daily.   cyanocobalamin 1000 MCG/ML injection Commonly known as:  (VITAMIN B-12) Inject 1,000 mcg into the muscle every 30 (thirty) days.   famotidine 10 MG tablet Commonly known as:  PEPCID Take 10 mg by mouth daily as needed for heartburn or indigestion.   furosemide 40 MG tablet Commonly known as:  LASIX Take 2 tablets (80 mg total) by mouth daily for 30 days.   metoprolol tartrate 100 MG tablet Commonly known as:  LOPRESSOR Take 1 tablet (100 mg total) by mouth 2 (two) times daily for 30 days.   NON FORMULARY Stool softner       Allergies  Allergen Reactions  . Prednisone Other (See Comments)    hallucinations    Consultations:  Cardiology   Palliative care   Procedures/Studies: Dg Abd 1 View  Result Date: 02/18/2018 CLINICAL DATA:  Vomiting. EXAM: ABDOMEN - 1 VIEW COMPARISON:  No recent prior. FINDINGS: Soft tissue structures are unremarkable. No bowel distention. No free air. Calcifications in the pelvis consistent phleboliths. Degenerative changes lumbar spine and both hips. IMPRESSION: No acute abnormality. Electronically Signed   By: Marcello Moores  Register   On: 02/18/2018 13:34   Ct Head Wo Contrast  Result Date: 02/09/2018 CLINICAL DATA:  85 year old female with unexplained altered mental status. Visual hallucinations, confusion and shortness of breath for 2 days. EXAM: CT HEAD WITHOUT CONTRAST TECHNIQUE: Contiguous axial images were obtained from the base of the skull through the vertex without intravenous  contrast. COMPARISON:  None. FINDINGS: Brain: Chronic encephalomalacia in the right inferior frontal gyrus with regional white matter gliosis. Patchy and confluent cerebral white matter hypodensity elsewhere. No midline shift, ventriculomegaly, mass effect, evidence of mass lesion, intracranial hemorrhage or evidence of cortically based acute infarction. No other cortical encephalomalacia. Vascular: Calcified atherosclerosis at the skull base. No suspicious intracranial vascular hyperdensity. Skull: Negative. Sinuses/Orbits: Visualized paranasal sinuses and mastoids are well pneumatized. Other: Negative visible orbits aside from postoperative changes. Trace intravenous gas about the right temporalis muscle, probably related to IV access. Other visualized scalp soft tissues are within normal limits. IMPRESSION: 1. Chronic right inferior frontal encephalomalacia is probably due to a prior anterior division right MCA infarct. 2. Moderate superimposed cerebral white matter changes, most commonly due to chronic small vessel disease. 3. No acute intracranial abnormality. Electronically Signed   By: Genevie Ann M.D.   On: 02/09/2018 18:25   Mr Brain Wo Contrast  Result Date: 02/09/2018 CLINICAL DATA:  Altered mental status EXAM: MRI HEAD WITHOUT CONTRAST TECHNIQUE: Multiplanar, multiecho pulse sequences of the brain and surrounding structures were obtained without intravenous contrast. COMPARISON:  Head CT 02/09/2018 FINDINGS: BRAIN: There is no acute infarct, acute hemorrhage, hydrocephalus or extra-axial collection. The midline structures are normal. No midline shift or other mass effect. Old right frontal lobe infarct  encephalomalacia. Diffuse confluent hyperintense T2-weighted signal within the periventricular, deep and juxtacortical white matter, most commonly due to chronic ischemic microangiopathy. Generalized atrophy without lobar predilection. Susceptibility-sensitive sequences show no chronic microhemorrhage or  superficial siderosis. VASCULAR: Major intracranial arterial and venous sinus flow voids are normal. SKULL AND UPPER CERVICAL SPINE: Calvarial bone marrow signal is normal. There is no skull base mass. Visualized upper cervical spine and soft tissues are normal. SINUSES/ORBITS: No fluid levels or advanced mucosal thickening. No mastoid or middle ear effusion. The orbits are normal. IMPRESSION: 1. No acute intracranial abnormality. 2. Chronic ischemic microangiopathy and generalized advanced atrophy. 3. Old right frontal lobe infarct associated encephalomalacia. Electronically Signed   By: Ulyses Jarred M.D.   On: 02/09/2018 22:25   Dg Chest Port 1 View  Result Date: 02/15/2018 CLINICAL DATA:  Shortness of Breath EXAM: PORTABLE CHEST 1 VIEW COMPARISON:  February 09, 2018 FINDINGS: There is no edema or consolidation. There is cardiomegaly with pulmonary venous hypertension. No adenopathy. No bone lesions. IMPRESSION: Pulmonary vascular congestion.  No evident edema or consolidation. Electronically Signed   By: Lowella Grip III M.D.   On: 02/15/2018 08:05   Dg Chest Portable 1 View  Result Date: 02/09/2018 CLINICAL DATA:  Chest pain and shortness of breath for 2 days EXAM: PORTABLE CHEST 1 VIEW COMPARISON:  04/08/2015 FINDINGS: Cardiac shadow is enlarged but stable. The lungs are well aerated bilaterally. No focal infiltrate or sizable effusion is seen. Degenerative changes of the thoracic spine are noted. IMPRESSION: No acute abnormality noted. Electronically Signed   By: Inez Catalina M.D.   On: 02/09/2018 17:00   Vas [place] Burnard Bunting With/wo Tbi  Result Date: 02/10/2018 LOWER EXTREMITY DOPPLER STUDY Indications: Peripheral artery disease.  Comparison Study: No studies for comparison Performing Technologist: Birdena Crandall, [place] [facility]  Examination Guidelines: A complete evaluation includes at minimum, Doppler waveform signals and systolic blood pressure reading at the level of bilateral brachial, anterior tibial,  and posterior tibial arteries, when vessel segments are accessible. Bilateral testing is considered an integral part of a complete examination. Photoelectric Plethysmograph (PPG) waveforms and toe systolic pressure readings are included as required and additional duplex testing as needed. Limited examinations for reoccurring indications may be performed as noted.  ABI Findings: +---------+------------------+-----+---------+--------+ Right    Rt Pressure (mmHg)IndexWaveform Comment  +---------+------------------+-----+---------+--------+ Brachial                        triphasicIV site  +---------+------------------+-----+---------+--------+ PTA      130               1.18 triphasic         +---------+------------------+-----+---------+--------+ DP       103               0.94 triphasic         +---------+------------------+-----+---------+--------+ Great Toe104               0.95                   +---------+------------------+-----+---------+--------+ +---------+------------------+-----+--------+-------+ Left     Lt Pressure (mmHg)IndexWaveformComment +---------+------------------+-----+--------+-------+ Brachial 110                                    +---------+------------------+-----+--------+-------+ PTA      139               1.26                 +---------+------------------+-----+--------+-------+  DP       137               1.25                 +---------+------------------+-----+--------+-------+ Great Toe77                0.70                 +---------+------------------+-----+--------+-------+ +-------+-----------+-----------+------------+------------+ ABI/TBIToday's ABIToday's TBIPrevious ABIPrevious TBI +-------+-----------+-----------+------------+------------+ Right  1.18       0.95                                +-------+-----------+-----------+------------+------------+ Left   1.26       0.70                                 +-------+-----------+-----------+------------+------------+  Summary: Right: Resting right ankle-brachial index is within normal range. No evidence of significant right lower extremity arterial disease. The right toe-brachial index is normal. Left: Resting left ankle-brachial index is within normal range. No evidence of significant left lower extremity arterial disease. The left toe-brachial index is normal.  *See table(s) above for measurements and observations.  Electronically signed by Curt Jews MD on 02/10/2018 at 8:03:13 PM.   Final    Vas [place] Lower Extremity Arterial Duplex  Result Date: 02/10/2018 LOWER EXTREMITY ARTERIAL DUPLEX STUDY  Current ABI: Rt 1.18 Lt 1.26  Examination Guidelines: A complete evaluation includes B-mode imaging, spectral Doppler, color Doppler, and power Doppler as needed of all accessible portions of each vessel. Bilateral testing is considered an integral part of a complete examination. Limited examinations for reoccurring indications may be performed as noted.  Right Duplex Findings: +-----------+--------+-----+--------+---------+--------+            PSV cm/sRatioStenosisWaveform Comments +-----------+--------+-----+--------+---------+--------+ CFA Distal 56                   triphasic         +-----------+--------+-----+--------+---------+--------+ DFA        50                   triphasic         +-----------+--------+-----+--------+---------+--------+ SFA Prox   88                   triphasic         +-----------+--------+-----+--------+---------+--------+ SFA Mid    44                   triphasic         +-----------+--------+-----+--------+---------+--------+ SFA Distal 43                   triphasic         +-----------+--------+-----+--------+---------+--------+ POP Prox   39                   triphasic         +-----------+--------+-----+--------+---------+--------+ ATA Distal 45                   triphasic          +-----------+--------+-----+--------+---------+--------+ PTA Prox   50                   triphasic         +-----------+--------+-----+--------+---------+--------+ PTA Distal 43  triphasic         +-----------+--------+-----+--------+---------+--------+ PERO Distal26                   triphasic         +-----------+--------+-----+--------+---------+--------+  Summary: Right: Normal examination. No evidence of arterial occlusive disease.  See table(s) above for measurements and observations. Electronically signed by Curt Jews MD on 02/10/2018 at 8:04:03 PM.    Final        Subjective: This am patient with no chest pain, no dyspnea, no nausea or chest pain. Very weak and deconditioned.   Discharge Exam: Vitals:   02/23/18 0932 02/24/18 0929  BP: 93/73 98/66  Pulse: (!) 116 (!) 111  Resp:    Temp:  97.8 F (36.6 C)  SpO2:  94%   Vitals:   02/22/18 2152 02/23/18 0300 02/23/18 0932 02/24/18 0929  BP: 99/76 97/68 93/73  98/66  Pulse: (!) 117 (!) 116 (!) 116 (!) 111  Resp:  20    Temp:  98.2 F (36.8 C)  97.8 F (36.6 C)  TempSrc:  Oral  Oral  SpO2:  94%  94%  Weight:  65.7 kg    Height:        General: deconditioned  Neurology: Awake and alert, non focal  E ENT: mild pallor, no icterus, oral mucosa moist Cardiovascular: No JVD. S1-S2 present, rhythmic, tachycardic,with no gallops, rubs, or murmurs. Trace lower extremity edema. Pulmonary: positive breath sounds bilaterally, poor inspiratory effort, no wheezing, rhonchi or rales. Gastrointestinal. Abdomen with no organomegaly, non tender, no rebound or guarding Skin. No rashes Musculoskeletal: no joint deformities   The results of significant diagnostics from this hospitalization (including imaging, microbiology, ancillary and laboratory) are listed below for reference.     Microbiology: No results found for this or any previous visit (from the past 240 hour(s)).   Labs: BNP (last 3  results) No results for input(s): BNP in the last 8760 hours. Basic Metabolic Panel: Recent Labs  Lab 02/18/18 0451  02/20/18 0328 02/21/18 0355 02/21/18 0944 02/22/18 0255 02/23/18 0703  NA 142   < > 139 135 136 135 137  K 4.3   < > 5.0 6.2* 5.6* 5.2* 4.8  CL 99   < > 88* 89* 88* 97* 100  CO2 31   < > 36* 26 35* 26 28  GLUCOSE 154*   < > 134* 100* 133* 107* 98  BUN 55*   < > 71* 92* 89* 83* 70*  CREATININE 1.61*   < > 2.22* 2.10* 2.29* 2.02* 1.75*  CALCIUM 10.0   < > 10.9* 10.6* 10.9* 10.3 9.7  MG 2.4  --   --   --   --   --   --   PHOS  --   --   --   --  3.4  --   --    < > = values in this interval not displayed.   Liver Function Tests: Recent Labs  Lab 02/21/18 0944  ALBUMIN 3.4*   No results for input(s): LIPASE, AMYLASE in the last 168 hours. No results for input(s): AMMONIA in the last 168 hours. CBC: Recent Labs  Lab 02/18/18 0451 02/19/18 0444 02/21/18 0355 02/22/18 0255  WBC 13.2*  --  12.2* 10.1  HGB 15.7* 16.4* 17.1* 15.5*  HCT 48.3* 50.7* 53.2* 48.9*  MCV 97.6  --  99.4 98.4  PLT 202  --  198 221   Cardiac Enzymes: No results for input(s): CKTOTAL, CKMB,  CKMBINDEX, TROPONINI in the last 168 hours. BNP: Invalid input(s): POCBNP CBG: No results for input(s): GLUCAP in the last 168 hours. D-Dimer No results for input(s): DDIMER in the last 72 hours. Hgb A1c No results for input(s): HGBA1C in the last 72 hours. Lipid Profile No results for input(s): CHOL, HDL, LDLCALC, TRIG, CHOLHDL, LDLDIRECT in the last 72 hours. Thyroid function studies No results for input(s): TSH, T4TOTAL, T3FREE, THYROIDAB in the last 72 hours.  Invalid input(s): FREET3 Anemia work up No results for input(s): VITAMINB12, FOLATE, FERRITIN, TIBC, IRON, RETICCTPCT in the last 72 hours. Urinalysis    Component Value Date/Time   COLORURINE YELLOW 02/09/2018 2003   APPEARANCEUR CLEAR 02/09/2018 2003   LABSPEC 1.019 02/09/2018 2003   PHURINE 5.0 02/09/2018 2003   GLUCOSEU  NEGATIVE 02/09/2018 2003   HGBUR NEGATIVE 02/09/2018 2003   HGBUR negative 02/14/2010 [place] 02/09/2018 2003   BILIRUBINUR n 02/28/2013 1112   KETONESUR 5 (A) 02/09/2018 2003   PROTEINUR NEGATIVE 02/09/2018 2003   UROBILINOGEN 0.2 02/28/2013 1112   UROBILINOGEN 0.2 02/14/2010 1317   NITRITE NEGATIVE 02/09/2018 2003   LEUKOCYTESUR NEGATIVE 02/09/2018 2003   Sepsis Labs Invalid input(s): PROCALCITONIN,  WBC,  LACTICIDVEN Microbiology No results found for this or any previous visit (from the past 240 hour(s)).   Time coordinating discharge: 45 minutes  SIGNED:   Tawni Millers, MD  [facility] 02/24/2018, 10:14 AM Pager [phone]  If 7PM-7AM, please contact night-coverage [email or web address] Password [email or web address]

## 2018-02-24 NOTE — Progress Notes (Signed)
                                                                                                                                                                                                         Daily Progress Note   Patient Name: Kendra Bray       Date: 02/24/2018 DOB: 11/25/1927  Age: 85 y.o. MRN#: [id number] Attending Physician: Arrien, Mauricio Daniel,* Primary Care Physician: Todd, Jeffrey A, MD Admit Date: 02/09/2018  Reason for Consultation/Follow-up: Establishing goals of care  Subjective: Patient is resting in bed. No family at bedside. Breakfast tray is in front of her with possibly 1 to 2 bites missing.  She denies pain or other symptoms today. She states she feels she needs to complete the work she was sent here to do. 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 She feels a burden for these individuals. She states she is aware this is all up to God, but again states she feels like she needs to do what she was sent here to do. She is happy to be transferred to hospice home when they have a bed available.       I completed a MOST form today and the signed original was placed in the chart. A photocopy was placed in the chart to be scanned into EMR. The patient outlined their wishes for the following treatment decisions:  Cardiopulmonary Resuscitation: Do Not Attempt Resuscitation (DNR/No CPR)  Medical Interventions: Comfort Measures: Keep clean, warm, and dry. Use medication by any route, positioning, wound care, and other measures to relieve pain and suffering. Use oxygen, suction and manual treatment of airway obstruction as needed for comfort. Do not transfer to the hospital unless comfort needs cannot be met in current location.  Antibiotics: No antibiotics (use other measures to relieve symptoms)  IV Fluids: No IV fluids (provide other measures to ensure comfort)  Feeding Tube: No feeding tube   No changes to the below medications.  Morphine  1 mg q 2 PRN for pain or dyspnea Ativan 0.5 mg q 4 PRN for anxiety Robinul 0.2 mg q 4 PRN for excessive secretions.  Length of Stay: 15  Current Medications: Scheduled Meds:  . allopurinol  100 mg Oral Daily  . metoprolol tartrate  100 mg Oral BID    Continuous Infusions:   PRN Meds: acetaminophen **OR** acetaminophen, alum & mag hydroxide-simeth, glycopyrrolate, LORazepam, morphine injection, ondansetron **OR** ondansetron (ZOFRAN) IV  Physical Exam Pulmonary:     Effort: Pulmonary effort is normal.  Skin:      General: Skin is warm and dry.  Neurological:     Mental Status: She is alert.             Vital Signs: BP 93/73   Pulse (!) 116   Temp 98.2 F (36.8 C) (Oral)   Resp 20   Ht 5' (1.524 m)   Wt 65.7 kg   SpO2 94%   BMI 28.29 kg/m  SpO2: SpO2: 94 % O2 Device: O2 Device: Nasal Cannula O2 Flow Rate: O2 Flow Rate (L/min): 2 L/min  Intake/output summary:   Intake/Output Summary (Last 24 hours) at 02/24/2018 3491 Last data filed at 02/23/2018 1900 Gross per 24 hour  Intake 548.9 ml  Output 200 ml  Net 348.9 ml   LBM: Last BM Date: (unknown) Baseline Weight: Weight: 73.6 kg Most recent weight: Weight: 65.7 kg       Palliative Assessment/Data: 30%    Flowsheet Rows     Most Recent Value  Intake Tab  Referral Department  Hospitalist  Unit at Time of Referral  Cardiac/Telemetry Unit  Palliative Care Primary Diagnosis  Cardiac  Date Notified  02/15/18  Palliative Care Type  New Palliative care  Reason for referral  Clarify Goals of Care  Date of Admission  02/09/18  Date first seen by Palliative Care  02/16/18  # of days Palliative referral response time  1 Day(s)  # of days IP prior to Palliative referral  6  Clinical Assessment  Psychosocial & Spiritual Assessment  Palliative Care Outcomes      Patient Active Problem List   Diagnosis Date Noted  . Atrial flutter with rapid ventricular response ([place])   . Acute renal failure superimposed on  stage 3 chronic kidney disease ([place])   . Hyperkalemia   . Typical atrial flutter ([place])   . Acute on chronic combined systolic and diastolic CHF (congestive heart failure) ([place])   . Atrial fibrillation ([place]) 02/10/2018  . Atrial fibrillation with RVR ([place]) 02/09/2018  . Acute encephalopathy 02/09/2018  . CKD (chronic kidney disease), stage III ([place]) 02/09/2018  . Elevated troponin   . Anemia 02/26/2012  . OSTEOARTHRITIS, KNEE, RIGHT 03/13/2009  . VITAMIN D DEFICIENCY 01/25/2009  . ALLERGIC RHINITIS 07/20/2008  . RENAL FAILURE 02/15/2008  . ARTHRITIS, HX OF 09/15/2006  . OSTEOPOROSIS 09/09/2006  . HYPERTENSION 09/07/2006  . GERD 09/07/2006  . BREAST CANCER, HX OF 09/07/2006    Palliative Care Assessment & Plan    Recommendations/Plan:  Awaiting a bed at hospice home.     Code Status:    Code Status Orders  (From admission, onward)         Start     Ordered   02/11/18 1252  Do not attempt resuscitation (DNR)  Continuous    Question Answer Comment  In the event of cardiac or respiratory ARREST Do not call a "code blue"   In the event of cardiac or respiratory ARREST Do not perform Intubation, CPR, defibrillation or ACLS   In the event of cardiac or respiratory ARREST Use medication by any route, position, wound care, and other measures to relive pain and suffering. May use oxygen, suction and manual treatment of airway obstruction as needed for comfort.      02/11/18 1251        Code Status History    Date Active Date Inactive Code Status Order ID Comments User Context   02/09/2018 2342 02/11/2018 1251 Full Code [id number]  Rise Patience, MD ED    Advance Directive  Documentation     Most Recent Value  Type of Advance Directive  Living will  Pre-existing out of facility DNR order (yellow form or pink MOST form)  -  "MOST" Form in Place?  -       Prognosis:  < 2 weeks. Bites and sips. Metabolic derangement. BUN and creatinine elevated. Somewhat improved  with IV fluids. CHF EF - 15-20%  Discharge Planning:  Hospice home    Thank you for allowing the Palliative Medicine Team to assist in the care of this patient.   Total Time 35 min Prolonged Time Billed no  :    Greater than 50%  of this time was spent counseling and coordinating care related to the above assessment and plan.  Asencion Gowda, NP  Please contact Palliative Medicine Team phone at [phone] for questions and concerns.

## 2018-02-24 NOTE — Progress Notes (Signed)
[facility] room available for patient today. Paper work completed with daughter [name]. CSW Manuela Schwartz aware.  Discharge summary has been sent.   RN please call report to [phone].  Thank you,  Erling Conte, LCSW [phone]

## 2018-02-24 NOTE — Social Work (Signed)
This is a late entry note from encounter on 02/23/18. CSW met with patient, daughter, and son-in-law at bedside. Confirmed plan for comfort care and preference for [facility] residential hospice. CSW made referral to [facility]. No beds available on 1/28. CSW will continue to follow and support with hospice placement.  Estanislado Emms, LCSW [phone]

## 2018-02-24 NOTE — Progress Notes (Signed)
Report called to [facility]. All questions answered. Patient is en route to [facility] via [place] at this time.

## 2018-02-24 NOTE — Progress Notes (Signed)
Palliative care noted reviewed.  SHe is going to Hospic care. Cardiology will sign off.

## 2018-02-24 NOTE — Social Work (Signed)
Patient will discharge to [facility] Anticipated discharge date: 02/24/2018 Family notified: Angelica Pou, daughter Transportation by: Corey Harold  Nurse to call report to [phone].  CSW signing off.  Estanislado Emms, [place]  Clinical Social Worker

## 2018-03-28 DEATH — deceased

## 2018-11-25 IMAGING — DX DG HIP (WITH OR WITHOUT PELVIS) 3-4V BILAT
3 series · 3 of 3 positions shown · non-contrast
Comparison: None.

CLINICAL DATA: Right hip pain. Patient slipped on ice 4 days ago.
Pain is along the medial right hip.

EXAM:
DG HIP (WITH OR WITHOUT PELVIS) 3-4V BILAT

[dg hips bilat w or w/o pelvis 3-4 views (1 of 3)]
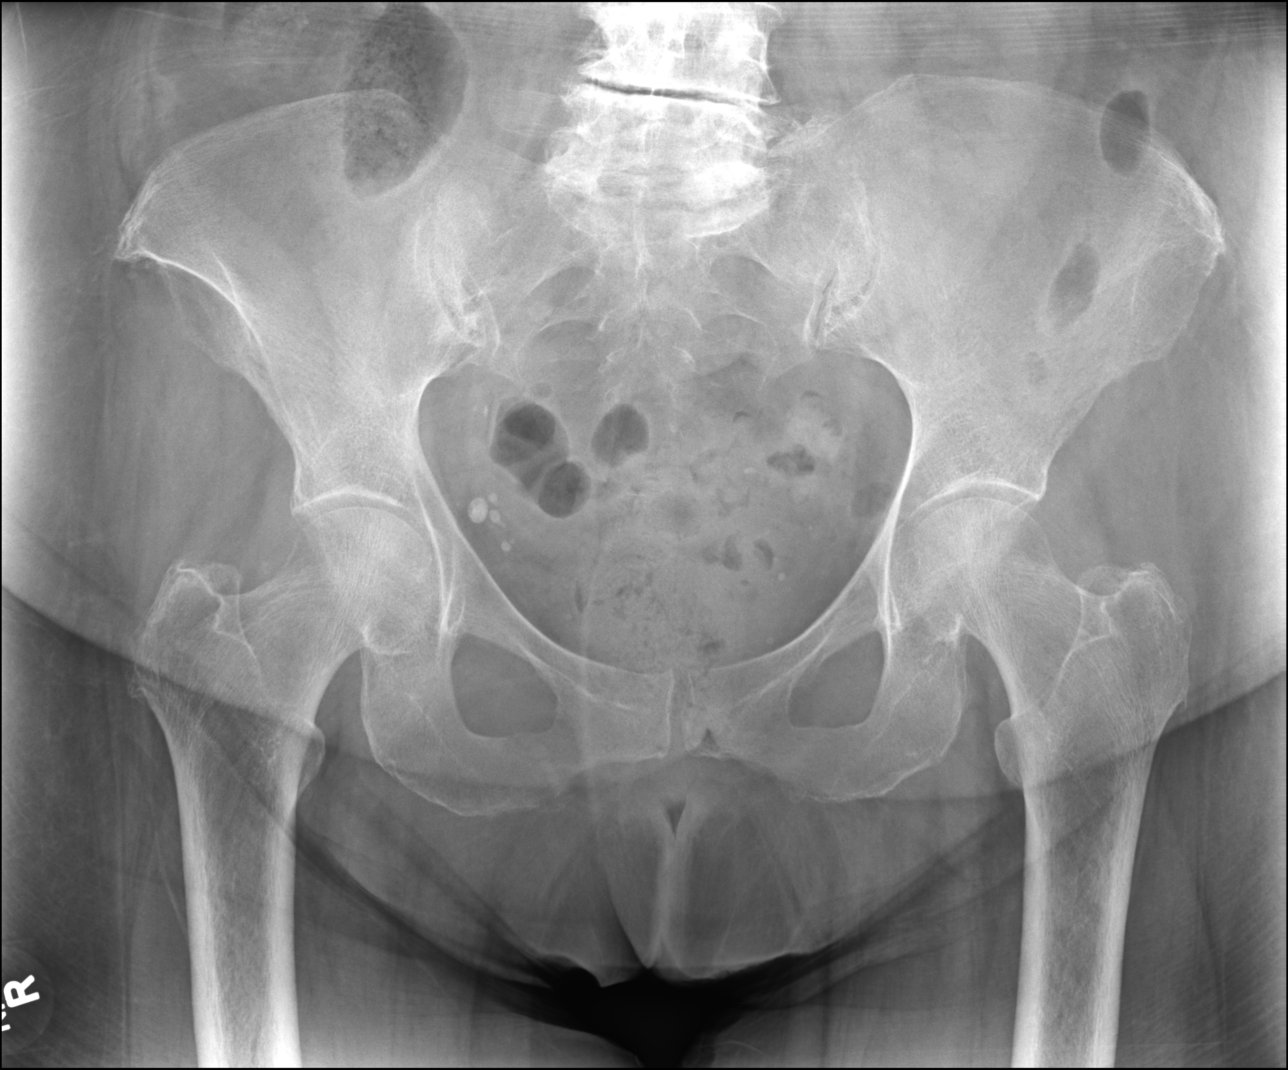

[dg hips bilat w or w/o pelvis 3-4 views (2 of 3)]
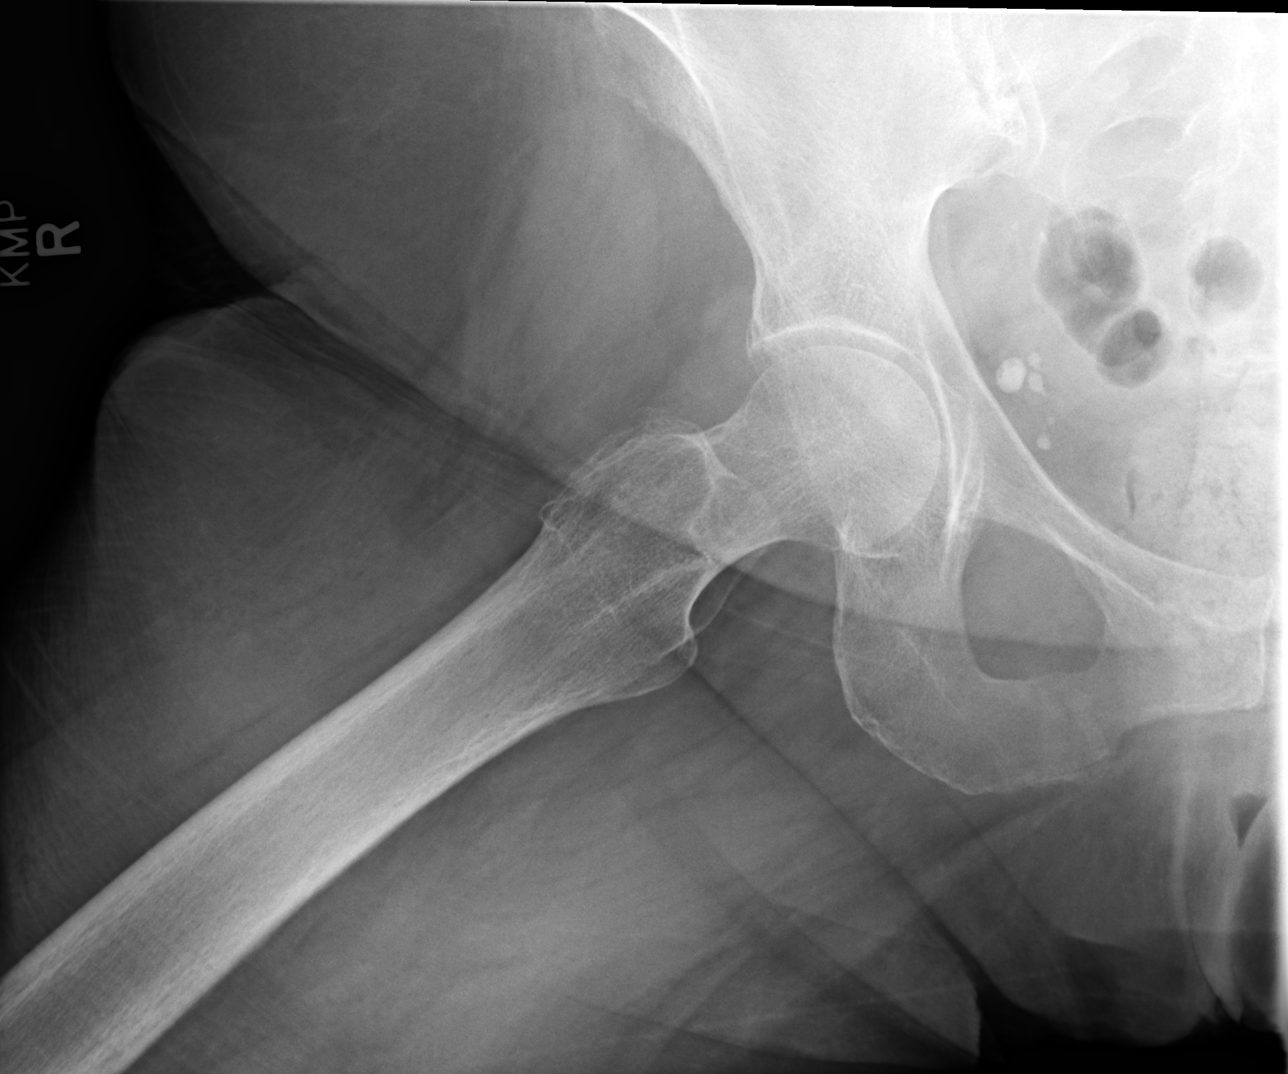

[dg hips bilat w or w/o pelvis 3-4 views (3 of 3)]
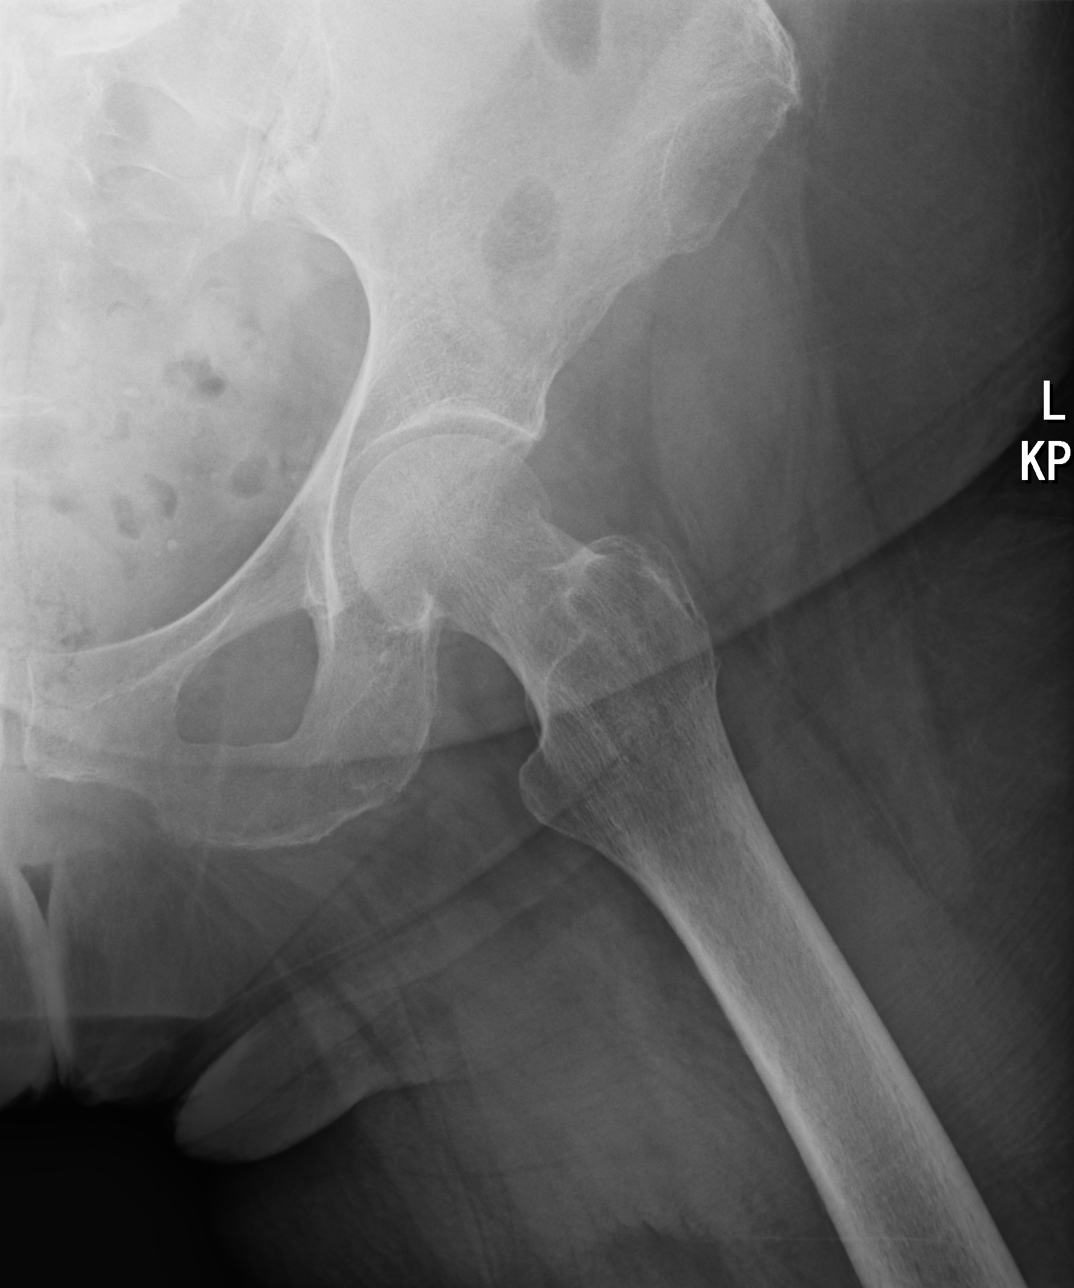

[3 of 3 positions shown; findings below may reference images not displayed]

FINDINGS: No fracture.  No bone lesion.

The hip joints are normally spaced and aligned. No arthropathic
change.

The SI joints and symphysis pubis are normally aligned.

Skeletal structures are demineralized.

Soft tissues are unremarkable.

There are disc degenerative changes of the visible lower lumbar
spine.
IMPRESSION: 1. No fracture, dislocation or acute finding.
2. Hip joints are normally aligned with no significant arthropathic
change.

## 2018-12-29 IMAGING — NM NM PARATHYROID W/ SPECT
4 series · 14 of 14 positions shown · non-contrast
Comparison: None.

CLINICAL DATA: Concern for parathyroid adenoma. Elevated serum
calcium. PTH equal 49

EXAM:
NM PARATHYROID SCINTIGRAPHY AND SPECT IMAGING
TECHNIQUE: Following intravenous administration of radiopharmaceutical, early
and 2-hour delayed planar images were obtained in the anterior
projection. Delayed triplanar SPECT images were also obtained at 2
hours.
RADIOPHARMACEUTICALS:  24.9 mCi 4c-44m Sestamibi IV

[Series 1: wbr_bone_60 15 min ant · 2.07mm/px · 1 of 1 slices shown]
[im 1/1]
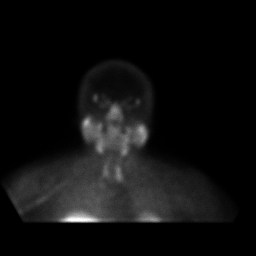

[Series 1: wbr_bone_60 2 hr ant · 4.14mm/px · 1 of 1 slices shown]
[im 1/1]
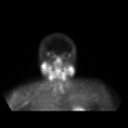

[Series 1: spect - (id)_(id)_tra · 8.3mm · 8.28mm/px · 6 of 64 frames shown]
[frame 6/64]
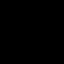
[frame 16/64]
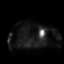
[frame 27/64]
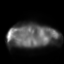
[frame 38/64]
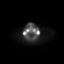
[frame 48/64]
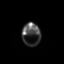
[frame 59/64]
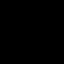

[Series 1: spect - (id)_(id)_cor · 8.3mm · 8.28mm/px · 6 of 64 frames shown]
[frame 6/64]
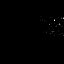
[frame 16/64]
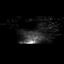
[frame 27/64]
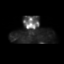
[frame 38/64]
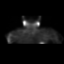
[frame 48/64]
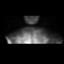
[frame 59/64]
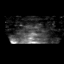

[14 of 14 positions shown; findings below may reference images not displayed]

FINDINGS: Normal washout of activity from the thyroid gland at 2 hour imaging.
No focal activity above background thyroid activity. SPECT imaging
of the neck fails to localize a parathyroid adenoma.
IMPRESSION: No parathyroid adenoma localized by SPECT imaging.
